# Patient Record
Sex: Male | Born: 1960 | Race: Black or African American | Hispanic: No | Marital: Single | Smoking: Former smoker
Health system: Southern US, Community
[De-identification: ages and names within clinical notes are randomized; demographics above are authoritative.]

## PROBLEM LIST (undated history)

## (undated) DIAGNOSIS — J45909 Unspecified asthma, uncomplicated: Secondary | ICD-10-CM

## (undated) DIAGNOSIS — J449 Chronic obstructive pulmonary disease, unspecified: Secondary | ICD-10-CM

## (undated) DIAGNOSIS — R0600 Dyspnea, unspecified: Secondary | ICD-10-CM

## (undated) DIAGNOSIS — F419 Anxiety disorder, unspecified: Secondary | ICD-10-CM

## (undated) DIAGNOSIS — F409 Phobic anxiety disorder, unspecified: Secondary | ICD-10-CM

## (undated) DIAGNOSIS — C801 Malignant (primary) neoplasm, unspecified: Secondary | ICD-10-CM

## (undated) DIAGNOSIS — M1990 Unspecified osteoarthritis, unspecified site: Secondary | ICD-10-CM

## (undated) DIAGNOSIS — E119 Type 2 diabetes mellitus without complications: Secondary | ICD-10-CM

## (undated) HISTORY — PX: ABDOMINAL SURGERY: SHX537

## (undated) HISTORY — PX: HERNIA REPAIR: SHX51

## (undated) HISTORY — PX: KNEE SURGERY: SHX244

---
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
## 2000-09-10 ENCOUNTER — Emergency Department (HOSPITAL_COMMUNITY): Admission: EM | Admit: 2000-09-10 | Discharge: 2000-09-10 | Payer: Self-pay | Admitting: Emergency Medicine

## 2000-09-10 ENCOUNTER — Encounter: Payer: Self-pay | Admitting: Emergency Medicine

## 2013-09-27 ENCOUNTER — Emergency Department (HOSPITAL_COMMUNITY)
Admission: EM | Admit: 2013-09-27 | Discharge: 2013-09-28 | Disposition: A | Discharge status: Home / Self Care | Payer: PRIVATE HEALTH INSURANCE | Attending: Emergency Medicine | Admitting: Emergency Medicine

## 2013-09-27 ENCOUNTER — Encounter (HOSPITAL_COMMUNITY): Payer: Self-pay | Admitting: Emergency Medicine

## 2013-09-27 ENCOUNTER — Emergency Department (HOSPITAL_COMMUNITY): Payer: PRIVATE HEALTH INSURANCE

## 2013-09-27 DIAGNOSIS — R079 Chest pain, unspecified: Secondary | ICD-10-CM | POA: Insufficient documentation

## 2013-09-27 DIAGNOSIS — M79609 Pain in unspecified limb: Secondary | ICD-10-CM | POA: Insufficient documentation

## 2013-09-27 DIAGNOSIS — R071 Chest pain on breathing: Secondary | ICD-10-CM | POA: Insufficient documentation

## 2013-09-27 DIAGNOSIS — F17200 Nicotine dependence, unspecified, uncomplicated: Secondary | ICD-10-CM | POA: Diagnosis not present

## 2013-09-27 DIAGNOSIS — R0789 Other chest pain: Secondary | ICD-10-CM

## 2013-09-27 LAB — BASIC METABOLIC PANEL
ANION GAP: 14 (ref 5–15)
BUN: 18 mg/dL (ref 6–23)
CO2: 23 mEq/L (ref 19–32)
Calcium: 9.1 mg/dL (ref 8.4–10.5)
Chloride: 103 mEq/L (ref 96–112)
Creatinine, Ser: 1.07 mg/dL (ref 0.50–1.35)
GFR calc Af Amer: >90 mL/min (ref >90)
GFR, EST NON AFRICAN AMERICAN: 79 mL/min — AB (ref >90)
GLUCOSE: 98 mg/dL (ref 70–99)
POTASSIUM: 4.1 meq/L (ref 3.7–5.3)
SODIUM: 140 meq/L (ref 137–147)

## 2013-09-27 LAB — I-STAT TROPONIN, ED: TROPONIN I, POC: 0 ng/mL (ref 0.00–0.08)

## 2013-09-27 LAB — CBC
HCT: 43.3 % (ref 39.0–52.0)
Hemoglobin: 16 g/dL (ref 13.0–17.0)
MCH: 31.3 pg (ref 26.0–34.0)
MCHC: 37 g/dL — ABNORMAL HIGH (ref 30.0–36.0)
MCV: 84.6 fL (ref 78.0–100.0)
Platelets: 248 10*3/uL (ref 150–400)
RBC: 5.12 MIL/uL (ref 4.22–5.81)
RDW: 13.3 % (ref 11.5–15.5)
WBC: 7.3 10*3/uL (ref 4.0–10.5)

## 2013-09-27 LAB — PRO B NATRIURETIC PEPTIDE

## 2013-09-27 IMAGING — CR DG CHEST 2V
2 series · 2 of 2 positions shown · non-contrast
Comparison: None.

CLINICAL DATA: 53-year-old male with left chest pain.

EXAM:
CHEST  2 VIEW

[w chest pa]
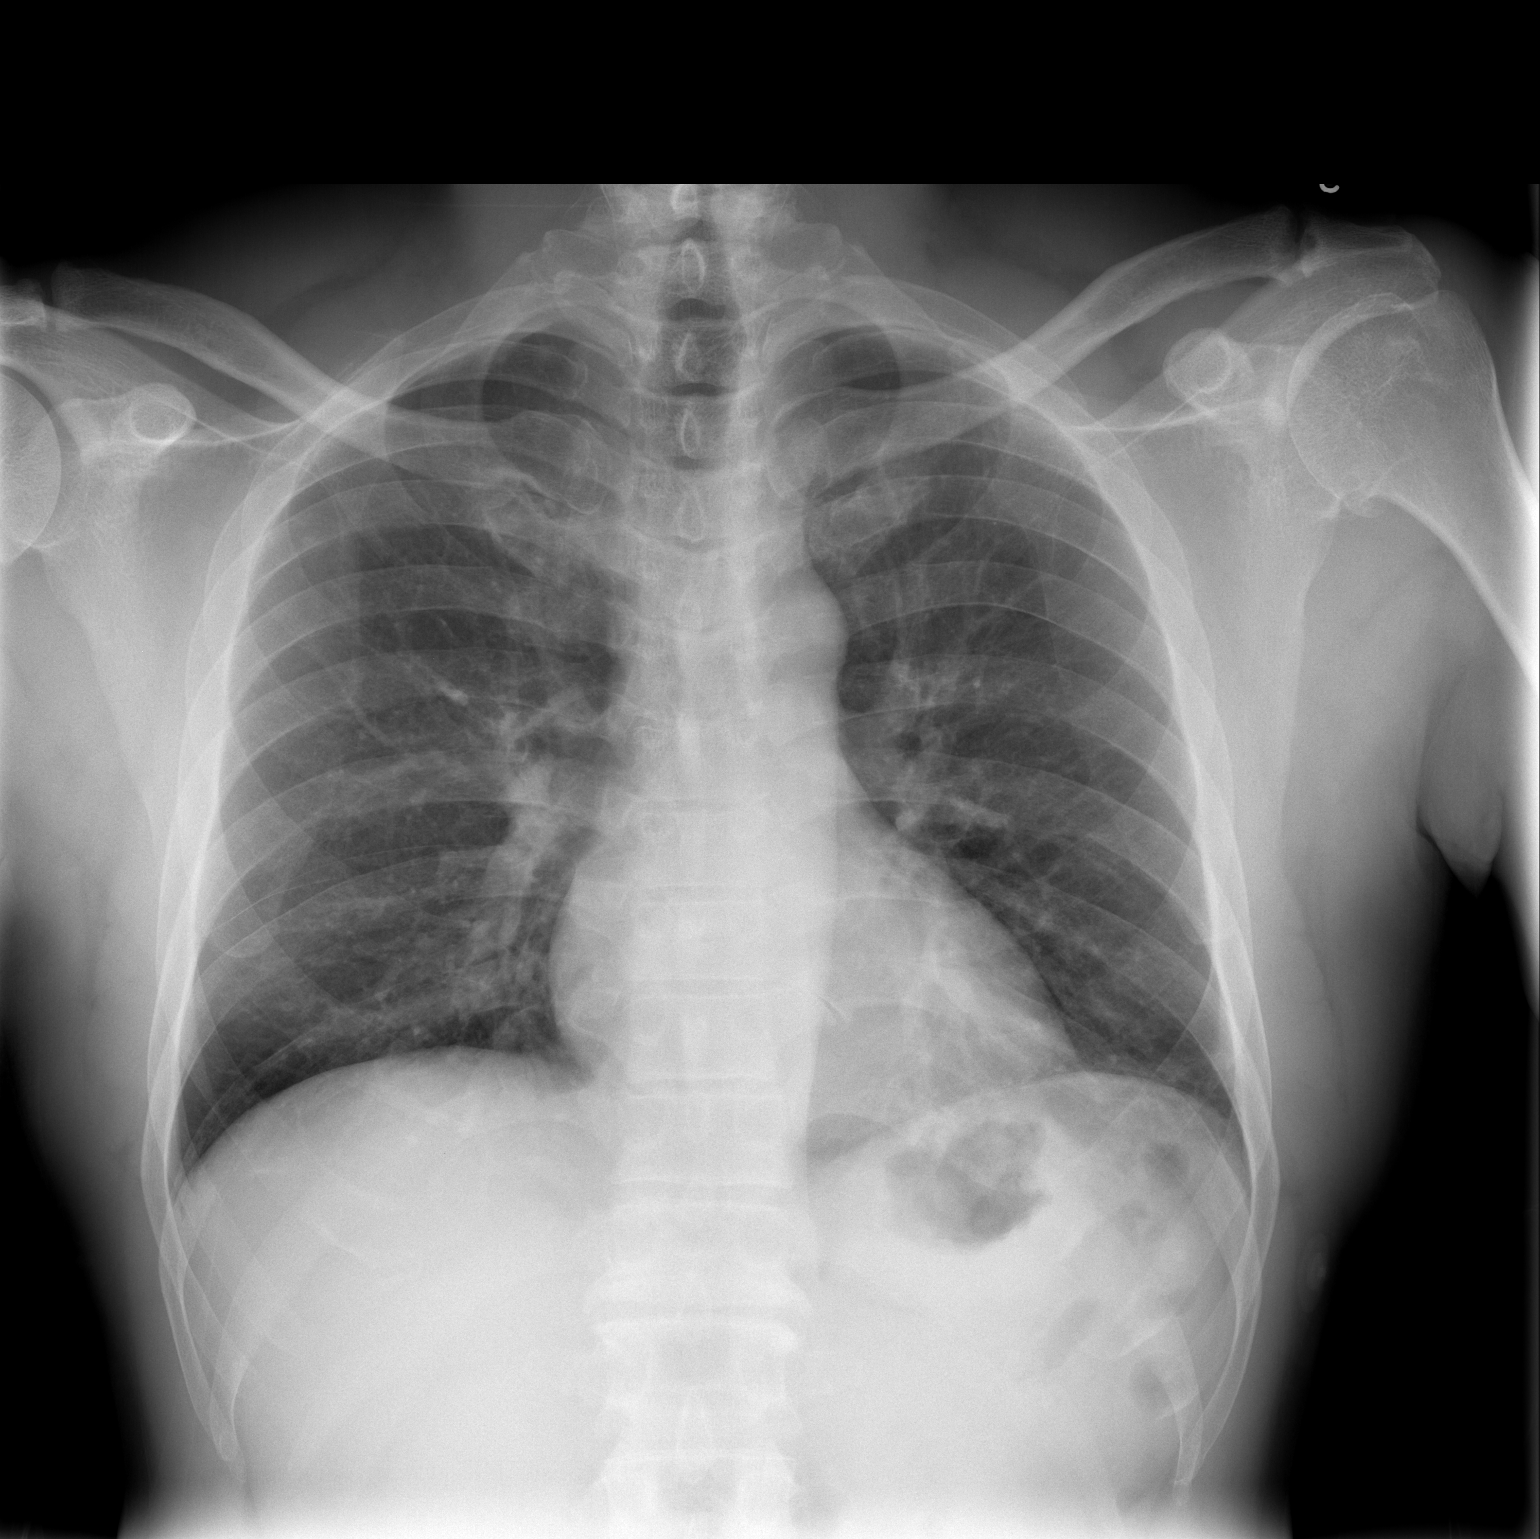

[w chest lat]
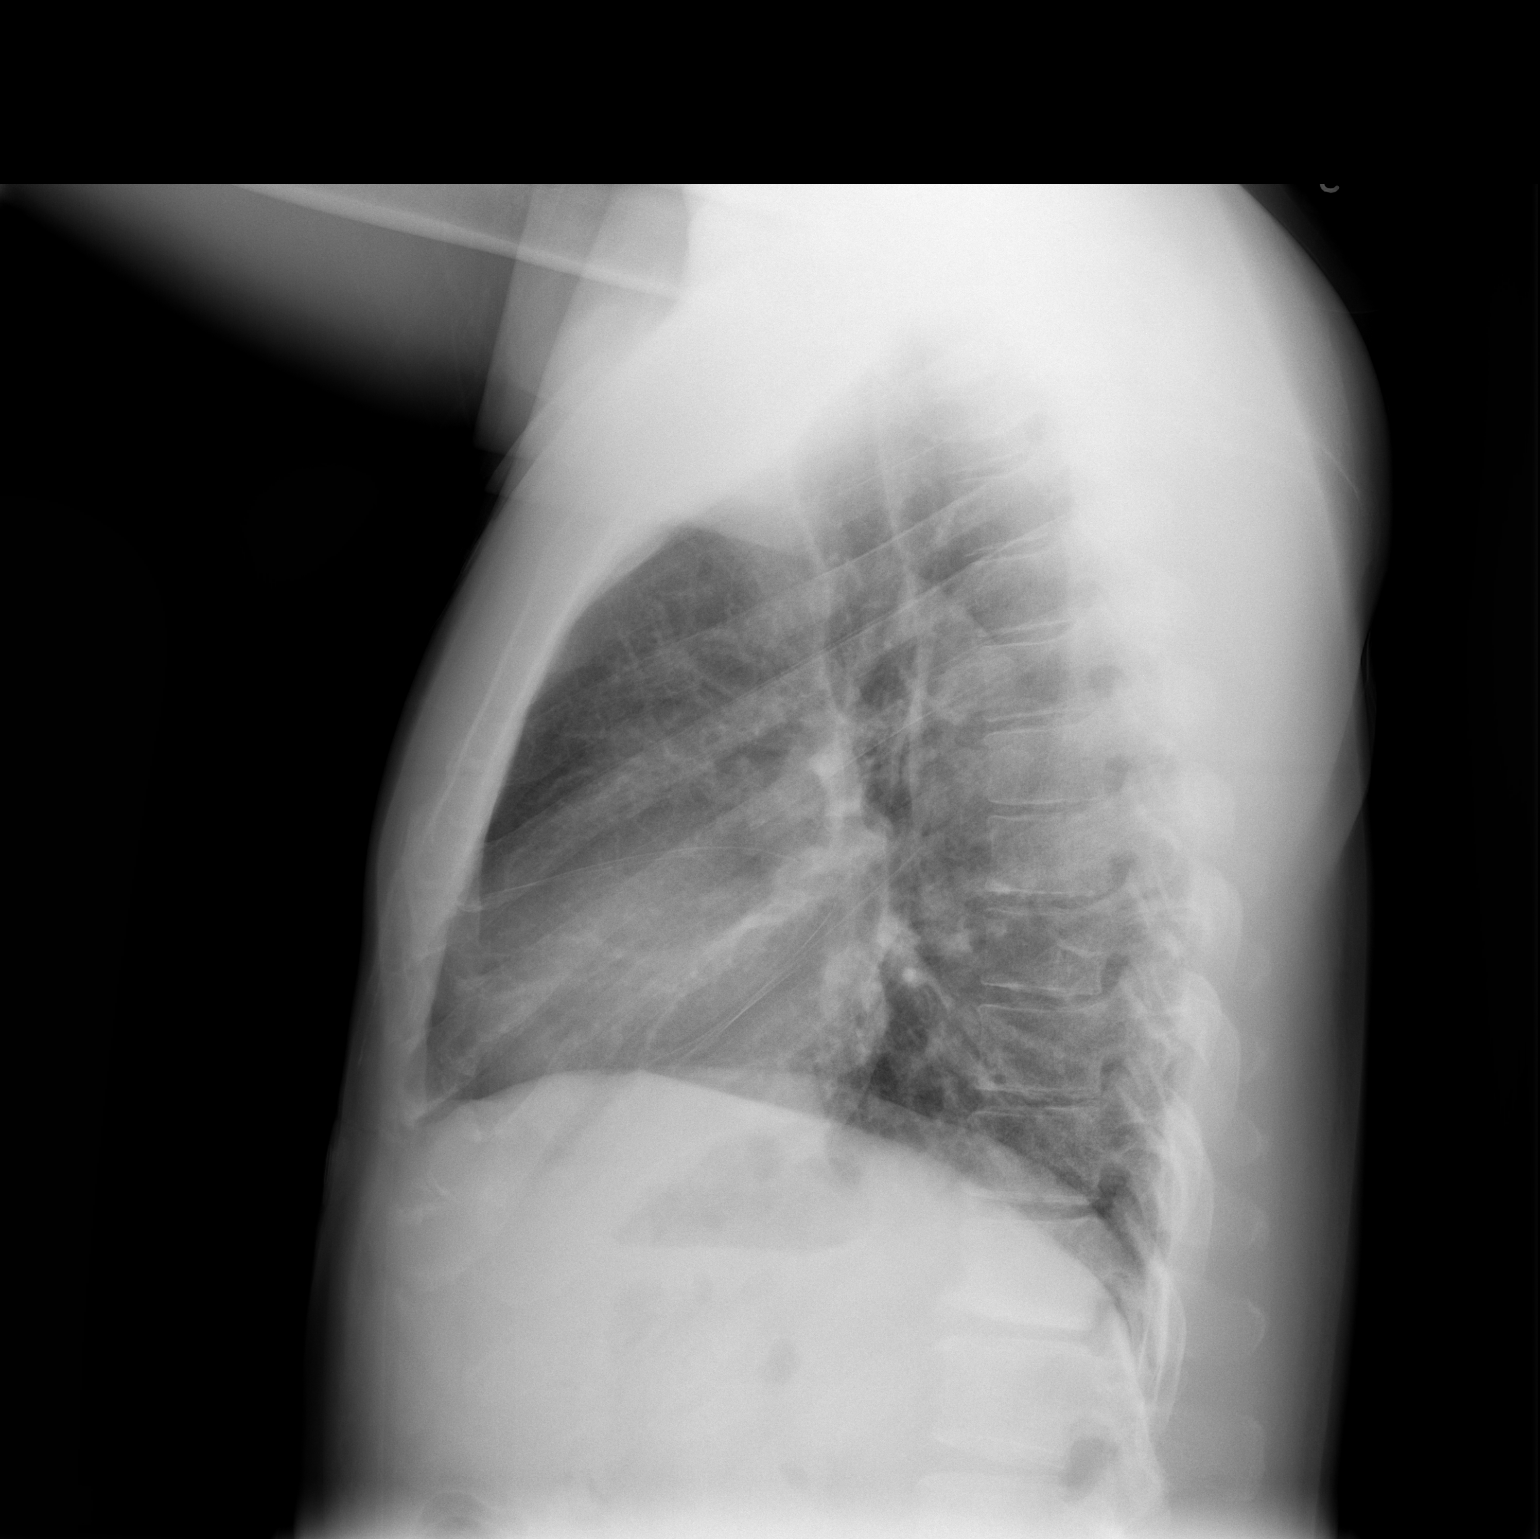

[2 of 2 positions shown; findings below may reference images not displayed]

FINDINGS: The cardiomediastinal silhouette is unremarkable.

Mild peribronchial thickening is noted -likely chronic.

There is no evidence of focal airspace disease, pulmonary edema,
suspicious pulmonary nodule/mass, pleural effusion, or pneumothorax.
No acute bony abnormalities are identified.
IMPRESSION: Mild peribronchial thickening of uncertain chronicity without other
significant abnormality.

## 2013-09-27 MED ORDER — TRAMADOL HCL 50 MG PO TABS
50.0000 mg | ORAL_TABLET | Freq: Once | ORAL | Status: AC
  Administered 2013-09-28: 50 mg via ORAL
  Filled 2013-09-27: qty 1

## 2013-09-27 NOTE — ED Provider Notes (Signed)
CSN: [id number]     Arrival date & time 09/27/13  1955 History   First MD Initiated Contact with Patient 09/27/13 2324     Chief Complaint  Patient presents with  . Chest Pain     (Consider location/radiation/quality/duration/timing/severity/associated sxs/prior Treatment) HPI  53 year old male without a significant past medical history he was a smoker presents complaining of chest pain. Patient report waking up this morning with sharp achy pain to his left side of chest, radiates down to his left arm and also radiates up to his neck. Pain initially intense but has improved throughout the day. Pain is worsening with neck movement especially to the left, with raising his arm, and also experiencing tingling sensation throughout his hands and fingers. He also report and diaphoretic this morning along with having some shortness of breath. That has improved. He is a smoker but denies any significant history of heart disease or family history of premature cardiac death. He's never had any prior provocative cardiac testing. No prior history of PE DVT, no recent surgery, prolonged bed rest, unilateral leg swelling or calf pain, hemoptysis, or history of active cancer. 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 He usually has to lift 70-80 pounds of weight at work.   History reviewed. No pertinent past medical history. History reviewed. No pertinent past surgical history. No family history on file. History  Substance Use Topics  . Smoking status: Current Every Day Smoker  . Smokeless tobacco: Not on file  . Alcohol Use: No    Review of Systems  All other systems reviewed and are negative.     Allergies  Review of patient's allergies indicates no known allergies.  Home Medications   Prior to Admission medications   Medication Sig Start Date End Date Taking? Authorizing Provider  ibuprofen (ADVIL,MOTRIN) 100 MG chewable tablet Chew 100 mg by mouth  every 8 (eight) hours as needed.   Yes Historical Provider, MD   BP 125/80  Pulse 73  Temp(Src) 98 F (36.7 C) (Oral)  Resp 18  Ht  (1.753 m)  Wt 176 lb (79.833 kg)  BMI 25.98 kg/m2  SpO2 98% Physical Exam  Nursing note and vitals reviewed. Constitutional: He appears well-developed and well-nourished. No distress.  HENT:  Head: Atraumatic.  Eyes: Conjunctivae are normal.  Neck: Normal range of motion. Neck supple. No JVD present.  Cardiovascular: Normal rate and regular rhythm.  Exam reveals no gallop and no friction rub.   No murmur heard. Pulmonary/Chest: Effort normal and breath sounds normal. No respiratory distress. He has no wheezes. He has no rales. He exhibits tenderness (tenderness along left anterior chest wall without crepitus or emphysema, no overlying skin changes).  Abdominal: Soft. There is no tenderness.  Musculoskeletal: He exhibits tenderness (Tenderness throughout left arm, and left paracervical region without focal point tenderness or deformity.). He exhibits no edema.  Neurological: He is alert.  Intact left radial pulse with decreased grip strength but brisk cap refills to all fingers.  Skin: No rash noted.  Congenital deformity of right hand.  Psychiatric: He has a normal mood and affect.    ED Course  Procedures (including critical care time)  11:55 PM Patient here with reproducible chest wall pain and left arm and neck pain. I suspect this is musculoskeletal in origin. TIMI 0, Cannot use PERC criteria due to age however low suspicion for PE.   12:30 AM  Chest x-ray with mild peribronchial thickening  of uncertain chronicity without other significant abnormality. Normal troponin, normal pro BNP, the remainder of his labs are reassuring. His symptoms likely muscle skeletal in origin, and were treated with NSAIDs. Return precautions discussed. Resources provide. Patient report having intermittent bouts of anxiety and panic attack. No panic attack  currently. We'll provide Vistaril to use as needed for anxiety and panic attack.  Labs Review Labs Reviewed  CBC - Abnormal; Notable for the following:    MCHC 37.0 (*)    All other components within normal limits  BASIC METABOLIC PANEL - Abnormal; Notable for the following:    GFR calc non Af Amer 79 (*)    All other components within normal limits  PRO B NATRIURETIC PEPTIDE  I-STAT TROPOININ, ED    Imaging Review Dg Chest 2 View  09/27/2013   CLINICAL DATA:  53 year old male with left chest pain.  EXAM: CHEST  2 VIEW  COMPARISON:  None.  FINDINGS: The cardiomediastinal silhouette is unremarkable.  Mild peribronchial thickening is noted -likely chronic.  There is no evidence of focal airspace disease, pulmonary edema, suspicious pulmonary nodule/mass, pleural effusion, or pneumothorax. No acute bony abnormalities are identified.  IMPRESSION: Mild peribronchial thickening of uncertain chronicity without other significant abnormality.   Electronically Signed   By: Laveda Abbe M.D.   On: 09/27/2013 21:19     EKG Interpretation None      Date: 09/27/2013  Rate: 67  Rhythm: normal sinus rhythm  QRS Axis: normal  Intervals: normal  ST/T Wave abnormalities: normal  Conduction Disutrbances: none  Narrative Interpretation:   Old EKG Reviewed: none available for comparison     MDM   Final diagnoses:  Chest wall pain    BP 131/86  Pulse 58  Temp(Src) 98 F (36.7 C) (Oral)  Resp 13  Ht  (1.753 m)  Wt 176 lb (79.833 kg)  BMI 25.98 kg/m2  SpO2 98%  I have reviewed nursing notes and vital signs. I personally reviewed the imaging tests through PACS system  I reviewed available ER/hospitalization records thought the EMR     Fayrene Helper, [place] 09/28/13 0034

## 2013-09-27 NOTE — ED Notes (Signed)
Reports intermittent sharp chest pain since this morning that woke pt up.  Also reports sob and numbness to L fingers.  Grips strong and equal.  Denies nausea and vomiting.  No arm drift.  Took Advil without relief.

## 2013-09-27 NOTE — ED Notes (Signed)
Pt from home. States that he woke up this am with chest pain and right arm numbness. States that he has no cardiac history. Does smoke cigarettes. Pt admits to anxiety and panic attacks but does not know why he is anxious. States he has had panic attacks for past 6 yrs. Has not seen a counselor for the anxiety.

## 2013-09-28 MED ORDER — METHOCARBAMOL 500 MG PO TABS: 500.0000 mg | ORAL_TABLET | Freq: Two times a day (BID) | ORAL | Status: DC

## 2013-09-28 MED ORDER — HYDROXYZINE HCL 25 MG PO TABS: 25.0000 mg | ORAL_TABLET | Freq: Three times a day (TID) | ORAL | Status: DC | PRN

## 2013-09-28 MED ORDER — IBUPROFEN 800 MG PO TABS: 800.0000 mg | ORAL_TABLET | Freq: Three times a day (TID) | ORAL | Status: DC

## 2013-09-28 NOTE — Discharge Instructions (Signed)
Chest Wall Pain Chest wall pain is pain in or around the bones and muscles of your chest. It may take up to 6 weeks to get better. It may take longer if you must stay physically active in your work and activities.  CAUSES  Chest wall pain may happen on its own. However, it may be caused by:  A viral illness like the flu.  Injury.  Coughing.  Exercise.  Arthritis.  Fibromyalgia.  Shingles. HOME CARE INSTRUCTIONS   Avoid overtiring physical activity. Try not to strain or perform activities that cause pain. This includes any activities using your chest or your abdominal and side muscles, especially if heavy weights are used.  Put ice on the sore area.  Put ice in a plastic bag.  Place a towel between your skin and the bag.  Leave the ice on for 15-20 minutes per hour while awake for the first 2 days.  Only take over-the-counter or prescription medicines for pain, discomfort, or fever as directed by your caregiver. SEEK IMMEDIATE MEDICAL CARE IF:   Your pain increases, or you are very uncomfortable.  You have a fever.  Your chest pain becomes worse.  You have new, unexplained symptoms.  You have nausea or vomiting.  You feel sweaty or lightheaded.  You have a cough with phlegm (sputum), or you cough up blood. MAKE SURE YOU:   Understand these instructions.  Will watch your condition.  Will get help right away if you are not doing well or get worse. Document Released: 01/03/2005 Document Revised: 03/28/2011 Document Reviewed: 08/30/2010 [facility] Patient Information 2015 [place], [place]. This information is not intended to replace advice given to you by your health care provider. Make sure you discuss any questions you have with your health care provider.  Chest Pain (Nonspecific) It is often hard to give a diagnosis for the cause of chest pain. There is always a chance that your pain could be related to something serious, such as a heart attack or a blood clot in  the lungs. You need to follow up with your doctor. HOME CARE  If antibiotic medicine was given, take it as directed by your doctor. Finish the medicine even if you start to feel better.  For the next few days, avoid activities that bring on chest pain. Continue physical activities as told by your doctor.  Do not use any tobacco products. This includes cigarettes, chewing tobacco, and e-cigarettes.  Avoid drinking alcohol.  Only take medicine as told by your doctor.  Follow your doctor's suggestions for more testing if your chest pain does not go away.  Keep all doctor visits you made. GET HELP IF:  Your chest pain does not go away, even after treatment.  You have a rash with blisters on your chest.  You have a fever. GET HELP RIGHT AWAY IF:   You have more pain or pain that spreads to your arm, neck, jaw, back, or belly (abdomen).  You have shortness of breath.  You cough more than usual or cough up blood.  You have very bad back or belly pain.  You feel sick to your stomach (nauseous) or throw up (vomit).  You have very bad weakness.  You pass out (faint).  You have chills. This is an emergency. Do not wait to see if the problems will go away. Call your local emergency services (911 in U.S[place] Do not drive yourself to the hospital. MAKE SURE YOU:   Understand these instructions.  Will watch your  condition.  Will get help right away if you are not doing well or get worse. Document Released: 06/22/2007 Document Revised: 01/08/2013 Document Reviewed: 06/22/2007 [facility] Patient Information 2015 [place], [place]. This information is not intended to replace advice given to you by your health care provider. Make sure you discuss any questions you have with your health care provider.   Emergency Department Resource Guide 1) Find a Doctor and Pay Out of Pocket Although you won't have to find out who is covered by your insurance plan, it is a good idea to ask around and  get recommendations. You will then need to call the office and see if the doctor you have chosen will accept you as a new patient and what types of options they offer for patients who are self-pay. Some doctors offer discounts or will set up payment plans for their patients who do not have insurance, but you will need to ask so you aren't surprised when you get to your appointment.  2) Contact Your Local Health Department Not all health departments have doctors that can see patients for sick visits, but many do, so it is worth a call to see if yours does. If you don't know where your local health department is, you can check in your phone book. The CDC also has a tool to help you locate your state's health department, and many state websites also have listings of all of their local health departments.  3) Find a Walk-in Clinic If your illness is not likely to be very severe or complicated, you may want to try a walk in clinic. These are popping up all over the country in pharmacies, drugstores, and shopping centers. They're usually staffed by nurse practitioners or physician assistants that have been trained to treat common illnesses and complaints. They're usually fairly quick and inexpensive. However, if you have serious medical issues or chronic medical problems, these are probably not your best option.  No Primary Care Doctor: - Call Health Connect at  [phone] - they can help you locate a primary care doctor that  accepts your insurance, provides certain services, etc. - Physician Referral Service- [phone]  Chronic Pain Problems: Organization         Address  Phone   Notes  [facility]  [phone] Patients need to be referred by their primary care doctor.   Medication Assistance: Organization         Address  Phone   Notes  [facility] Medication [facility] [address] [place] [place], [place] [address] [phone] --Must be a resident of  [facility] -- Must have NO insurance coverage whatsoever (no Medicaid/ Medicare, etc.) -- The pt. MUST have a primary care doctor that directs their care regularly and follows them in the community   [facility]ist  [phone]   [employer]  [phone]    Agencies that provide inexpensive medical care: Organization         Address  Phone   Notes  [facility]  [phone]   [facility]    [phone]   [facility] [address] [place], [place] [address] [phone]   [facility] [address] [place] [place] [phone]   Planned Parenthood    [phone]   [facility]    [phone]   [facility]  [address] [place], [employer] Phone:  [phone], Fax:  [phone] Hours of Operation:  9 am - 6 pm, M-F.  Also accepts Medicaid/Medicare and self-pay.  [facility]  [address], [place] Phone: [phone], Fax: [phone]. Hours of Operation:  8:30 am - 5:30 pm, M-F.  Also accepts Medicaid and self-pay.  [facility] [place] [address] [place] Phone: [phone]   [facility] [address] Natasha Bence [place], [place] [phone], Ext. [phone] Mondays & Thursdays: 7-9 AM.  First 15 patients are seen on a first come, first serve basis.    Medicaid-accepting [facility] Providers:  Organization         Address  Phone   Notes  [facility] [address], [place] [phone] Also accepts self-pay patients.  [facility] [address] Laurell Josephs [place], [place]  [phone]   [facility] [address], [place] [phone]   [facility] [address], [place] [phone]   Renaye Rakers [address], [place]   [phone] Only accepts [place] Access  [place] patients after they have their name applied to their card.   Self-Pay (no insurance) in [facility]:  Organization         Address  Phone   Notes  Sickle Cell Patients, [facility] [address] [place], [place] [phone]   [facility] [address] [place], [place] [phone]   [facility] [place]  [address], [place] [phone]   [facility]/Dr. Osei-Bonsu  [address], [place] or [address], [place] [phone] Phone number for both [place] and [place] locations is the same.  [facility] [address], [place] [phone]   [facility] [address] [place] or [address] [phone]   [facility] [address] [place] [phone], phone; [phone], fax Sees patients 1st and 3rd Saturday of every month.  Must not qualify for public or private insurance (i.e. Medicaid, Medicare, [place] Health Choice, Veterans' Benefits)  Household income should be no more than 200% of the poverty level The clinic cannot treat you if you are pregnant or think you are pregnant  Sexually transmitted diseases are not treated at the clinic.    Dental Care: Organization         Address  Phone  Notes  [facility] Department of [facility] [address] [place], [place] [phone] Accepts children up to age 43 who are enrolled in [place] or [place] Health Choice; pregnant women with a Medicaid card; and children who have applied for Medicaid or [place] Health Choice, but were declined, whose parents can pay a reduced fee at time of service.  [facility] Department of [facility]  [address], [place] [phone] Accepts children up to age 66 who are enrolled in [place] or [place] Health Choice; pregnant women with a Medicaid  card; and children who have applied for Medicaid or  Health Choice, but were declined, whose parents can pay a reduced fee at time of service.  [facility]  [address] [place], [place] [phone] Patients are seen by appointment only. Walk-ins  are not accepted. [facility] will see patients 40 years of age and older. Monday - Tuesday (8am-5pm) Most Wednesdays (8:30-5pm) $30 per visit, cash only  [facility] Adult [facility] Access [facility]  [address], [facility] [phone] Patients are seen by appointment only. Walk-ins are not accepted. [facility] will see patients 31 years of age and older. One Wednesday Evening (Monthly: Volunteer Based).  $30 per visit, cash only  [employer]  [phone] for adults; Children under age 64, call Graduate Pediatric Dentistry at [phone]. Children aged 61-14, please call [phone] to request a pediatric application.  Dental services are provided in all areas of dental care including fillings, crowns and bridges, complete and partial dentures, implants, gum treatment, root canals, and extractions. Preventive care is also provided. Treatment is provided to both adults and children. Patients are selected via a lottery and there is often a waiting list.   [facility] [address], [place]  [phone] [email or web address]   [facility] [address] [place], [place] [phone], Ext. [phone] Second and Fourth Thursday of each month, opens at 6:30 AM; Clinic ends at 9 AM.  Patients are seen on a first-come first-served basis, and a limited number are seen during each clinic.   [facility]  [address] Ether Griffins [place], [place] [phone]   Eligibility Requirements You must have lived in [place], [place], or [place] counties for at least the last three months.   You cannot be eligible for state or federal sponsored [place],  including [employer], [place], or [employer].   You generally cannot be eligible for healthcare insurance through your employer.    How to apply: Eligibility screenings are held every Tuesday and Wednesday afternoon from 1:00 pm until 4:00 pm. You do not need an appointment for the interview!  [facility] [address] [place], [place] [phone]   [facility] Health Department  [phone]   [facility] Health Department  [phone]   [facility] Health Department  [phone]    Behavioral Health Resources in the Community: Intensive Outpatient Programs Organization         Address  Phone  Notes  [facility] [address], [place], [place] [phone]   [facility] Out[facility] [address] [place], [place] [phone]   [facility] [address] [place], [place]  [phone]   [facility] Health [address],  [place], [place] [phone] or [phone]   Substance Abuse Resources Organization         Address  Phone  Notes  [facility]  [phone]   [facility]  [phone]   [facility]  [phone]   Floydene Flock  [phone]   Residential & Outpatient Substance Abuse Program  [phone]   Psychological Services Organization         Address  Phone  Notes  [facility]  [phone]   [facility] Services  [phone]   [facility] Mental Health [address] [place] [phone] or [phone]    Mobile Crisis Teams Organization         Address  Phone  Notes  [facility], Mobile Crisis Care Unit  [phone]   [facility]  [address] [place], [place] [phone]   [facility] [address] [place] [phone]    Self-Help/Support  Groups Organization         Address  Phone             Notes  [facility]  [facility] [place] - variety of support groups  [phone]- I7437963 Call for more information  Narcotics Anonymous (NA), [facility] [address], [employer] [place]  2 meetings at this location   Statistician         Address  Phone  Notes  [facility] [address],    [place]  [phone]   [facility]  [address] [place] [address], [place], [place] [phone]   [facility] [address] [place], [place] [phone] Admissions: 8am-3pm M-F  [facility] [address],    [place], [place] [phone]   [facility] [address] [place], [place], [place] [phone]   [facility] [address],  [place], [place] [phone]   [facility] - Intensive Outpatient [address] Laurell Josephs [address], [place], [place] [phone]   [facility] ([facility] [address] [place]  [place], [place] [phone] or [phone]   [facility] ([facility]) [address], [place], [place] [phone] Accepts Medicaid  [facility] [address]  [place] [phone] Substance Abuse/Addiction Treatment   [facility] Organization         Address  Phone  Notes  [facility]  [phone]   Angie Fava, PhD [address] Ervin Knack [place], [place]   [phone] or [phone]   [facility] Behavioral   [address] [place], [place] [phone]   [facility] [address], [place], [place] [phone] Insurance/Medicaid/sponsorship through [facility] and Families [address]                                    [place], [place] [phone] Therapy/tele-psych/case  [facility] [address], [place] [phone]    Dr. Lolly Mustache  [phone]   [facility]  [employer] [facility] Dept. 1) [address] [place] 2)  [address], [place] 3)  [address], [place] [phone]  [phone]   [facility] Child Abuse Hotline (725)142-2715 or [phone] (After Hours)

## 2013-09-28 NOTE — ED Provider Notes (Signed)
Medical screening examination/treatment/procedure(s) were performed by non-physician practitioner and as supervising physician I was immediately available for consultation/collaboration.   EKG Interpretation None        Tomasita Crumble, MD 09/28/13 1709

## 2013-11-06 ENCOUNTER — Encounter (HOSPITAL_COMMUNITY): Payer: Self-pay | Admitting: Emergency Medicine

## 2013-11-06 ENCOUNTER — Emergency Department (HOSPITAL_COMMUNITY)
Admission: EM | Admit: 2013-11-06 | Discharge: 2013-11-06 | Disposition: A | Discharge status: Home / Self Care | Payer: PRIVATE HEALTH INSURANCE | Attending: Emergency Medicine | Admitting: Emergency Medicine

## 2013-11-06 DIAGNOSIS — L03113 Cellulitis of right upper limb: Secondary | ICD-10-CM | POA: Insufficient documentation

## 2013-11-06 DIAGNOSIS — Q681 Congenital deformity of finger(s) and hand: Secondary | ICD-10-CM | POA: Insufficient documentation

## 2013-11-06 DIAGNOSIS — Z720 Tobacco use: Secondary | ICD-10-CM | POA: Diagnosis not present

## 2013-11-06 DIAGNOSIS — M24541 Contracture, right hand: Secondary | ICD-10-CM | POA: Insufficient documentation

## 2013-11-06 DIAGNOSIS — L84 Corns and callosities: Secondary | ICD-10-CM | POA: Diagnosis not present

## 2013-11-06 DIAGNOSIS — L989 Disorder of the skin and subcutaneous tissue, unspecified: Secondary | ICD-10-CM | POA: Diagnosis present

## 2013-11-06 MED ORDER — TRAMADOL HCL 50 MG PO TABS: 50.0000 mg | ORAL_TABLET | Freq: Four times a day (QID) | ORAL | Status: DC | PRN

## 2013-11-06 MED ORDER — CEPHALEXIN 500 MG PO CAPS
1000.0000 mg | ORAL_CAPSULE | Freq: Two times a day (BID) | ORAL | Status: DC
Start: 2013-11-06 — End: 2013-11-08

## 2013-11-06 MED ORDER — SULFAMETHOXAZOLE-TRIMETHOPRIM 800-160 MG PO TABS: 1.0000 | ORAL_TABLET | Freq: Two times a day (BID) | ORAL | Status: DC

## 2013-11-06 NOTE — ED Provider Notes (Signed)
CSN: [id number]     Arrival date & time 11/06/13  0806 History   First MD Initiated Contact with Patient 11/06/13 [phone]     Chief Complaint  Patient presents with  . Wound Infection     (Consider location/radiation/quality/duration/timing/severity/associated sxs/prior Treatment) HPI  The patient was one week his right hand has had an area of increased swelling and pain. He identifies an area that was cut at work. 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 His wife reports that he constantly picks at specific area on the palmar surface just proximal to the fourth and fifth digits. The digits are significantly contracted permanently with a thick callous formed at the palmar crease. It is hypertrophic thickened area I have confirmed with patient and wife is a chronic condition. He reports his become painful for him to use a stick shift for his car because of the pressure on the palm is painful. He also notes that he works with many chemicals at work. He however does not endorse has been any injection type injury of chemicals into his hand. There is no specific work related injury however chest constant exposure.  History reviewed. No pertinent past medical history. Past Surgical History  Procedure Laterality Date  . Abdominal surgery    . Knee surgery     No family history on file. History  Substance Use Topics  . Smoking status: Current Every Day Smoker -- 1.00 packs/day    Types: Cigarettes  . Smokeless tobacco: Not on file  . Alcohol Use: Yes    Review of Systems Constitutional: No fevers or chills   Allergies  Naproxen  Home Medications   Prior to Admission medications   Medication Sig Start Date End Date Taking? Authorizing Provider  hydrOXYzine (ATARAX/VISTARIL) 25 MG tablet Take 1 tablet (25 mg total) by mouth every 8 (eight) hours as needed for anxiety. 09/28/13  Yes Fayrene HelperBowie Tran, PA-C  Phenyleph-CPM-DM-APAP (ALKA-SELTZER PLUS COLD & COUGH)  05-18-08-325 MG CAPS Take 2 capsules by mouth once.   Yes Historical Provider, MD  Phenylephrine-Pheniramine-DM [facility](THERAFLU COLD & COUGH PO) Take 30 mLs by mouth once.   Yes Historical Provider, MD  cephALEXin (KEFLEX) 500 MG capsule Take 2 capsules (1,000 mg total) by mouth 2 (two) times daily. 11/06/13   Arby BarretteMarcy Esly Selvage, MD  sulfamethoxazole-trimethoprim (SEPTRA DS) 800-160 MG per tablet Take 1 tablet by mouth 2 (two) times daily. 11/06/13   Arby BarretteMarcy Travor Royce, MD  traMADol (ULTRAM) 50 MG tablet Take 1 tablet (50 mg total) by mouth every 6 (six) hours as needed. 11/06/13   Arby BarretteMarcy Taym Twist, MD   BP 141/97  Pulse 73  Temp(Src) 98.1 F (36.7 C) (Oral)  Resp 18  SpO2 100% Physical Exam  Constitutional: He is oriented to person, place, and time. He appears well-developed and well-nourished. No distress.  HENT:  Head: Normocephalic and atraumatic.  Eyes: EOM are normal.  Cardiovascular: Intact distal pulses.   Pulmonary/Chest: Effort normal.  Abdominal: He exhibits no distension. There is no tenderness.  Musculoskeletal: Normal range of motion. He exhibits no edema.       Right hand: He exhibits tenderness.       Hands: Neurological: He is alert and oriented to person, place, and time. He has normal strength. Coordination normal. GCS eye subscore is 4. GCS verbal subscore is 5. GCS motor subscore is 6.  Skin: Skin is warm, dry and intact.  Psychiatric: He has a normal mood and affect.  Continuation of hand. Tender  to palpation on palmar surface around and under callous area. No palpable fluctuance. Possible swelling but difficult to differentiate swelling from chronic deformity. No swelling of thumb, wrist or proximal palm which have near normal anatomy. ED Course  Procedures (including critical care time) Labs Review Labs Reviewed - No data to display  Imaging Review No results found.   EKG Interpretation None      MDM   Final diagnoses:  Cellulitis of hand, right  Congenital deformity  of hand   At this time the patient will be treated for cellulitis. He does have pain and possible palm are swelling. There is very chronic callous formation there does not appear to have any acute injury related to it although it could be a nidus for infection. I can't palpate any fluctuance or suggestion of abscess at this point time. His fingers have chronic contracture is also difficult to assess for any possible tenosynovitis however range of motion at the wrist and the palm do not appear to be impacted. At this point and the patient be treated with Bactrim and Keflex for cellulitis with recommendation for followup with the hand specialist. 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 The patient is counseled to elevate the hand and not apply any constrictive dressing for padding except when needed for specific activities.    Arby BarretteMarcy Mariela Rex, MD 11/06/13 [phone]

## 2013-11-06 NOTE — ED Notes (Signed)
Pt c/o R hand pain and wound infection x 1 week.  Pain score 8/10.  Pt reports that he noticed a cut on his hand last week and area has become increasingly painful, swollen, red, and warm.  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

## 2013-11-06 NOTE — Discharge Instructions (Signed)
Cellulitis °Cellulitis is an infection of the skin and the tissue beneath it. The infected area is usually red and tender. Cellulitis occurs most often in the arms and lower legs.  °CAUSES  °Cellulitis is caused by bacteria that enter the skin through cracks or cuts in the skin. The most common types of bacteria that cause cellulitis are staphylococci and streptococci. °SIGNS AND SYMPTOMS  °· Redness and warmth. °· Swelling. °· Tenderness or pain. °· Fever. °DIAGNOSIS  °Your health care provider can usually determine what is wrong based on a physical exam. Blood tests may also be done. °TREATMENT  °Treatment usually involves taking an antibiotic medicine. °HOME CARE INSTRUCTIONS  °· Take your antibiotic medicine as directed by your health care provider. Finish the antibiotic even if you start to feel better. °· Keep the infected arm or leg elevated to reduce swelling. °· Apply a warm cloth to the affected area up to 4 times per day to relieve pain. °· Take medicines only as directed by your health care provider. °· Keep all follow-up visits as directed by your health care provider. °SEEK MEDICAL CARE IF:  °· You notice red streaks coming from the infected area. °· Your red area gets larger or turns dark in color. °· Your bone or joint underneath the infected area becomes painful after the skin has healed. °· Your infection returns in the same area or another area. °· You notice a swollen bump in the infected area. °· You develop new symptoms. °· You have a fever. °SEEK IMMEDIATE MEDICAL CARE IF:  °· You feel very sleepy. °· You develop vomiting or diarrhea. °· You have a general ill feeling (malaise) with muscle aches and pains. °MAKE SURE YOU:  °· Understand these instructions. °· Will watch your condition. °· Will get help right away if you are not doing well or get worse. °Document Released: 10/13/2004 Document Revised: 05/20/2013 Document Reviewed: 03/21/2011 °ExitCare® Patient Information ©2015 [employer].  This information is not intended to replace advice given to you by your health care provider. Make sure you discuss any questions you have with your health care provider. ° ° °Emergency Department Resource Guide °1) Find a Doctor and Pay Out of Pocket °Although you won't have to find out who is covered by your insurance plan, it is a good idea to ask around and get recommendations. You will then need to call the office and see if the doctor you have chosen will accept you as a new patient and what types of options they offer for patients who are self-pay. Some doctors offer discounts or will set up payment plans for their patients who do not have insurance, but you will need to ask so you aren't surprised when you get to your appointment. ° °2) Contact Your Local Health Department °Not all health departments have doctors that can see patients for sick visits, but many do, so it is worth a call to see if yours does. If you don't know where your local health department is, you can check in your phone book. The CDC also has a tool to help you locate your state's health department, and many state websites also have listings of all of their local health departments. ° °3) Find a Walk-in Clinic °If your illness is not likely to be very severe or complicated, you may want to try a walk in clinic. These are popping up all over the country in pharmacies, drugstores, and shopping centers. They're usually staffed by nurse practitioners   or physician assistants that have been trained to treat common illnesses and complaints. They're usually fairly quick and inexpensive. However, if you have serious medical issues or chronic medical problems, these are probably not your best option. ° °No Primary Care Doctor: °- Call [facility] at  832-8000 - they can help you locate a primary care doctor that  accepts your insurance, provides certain services, etc. °- Physician Referral Service- 1-800-533-3463 ° °Chronic Pain  Problems: °Organization         Address  Phone   Notes  °[facility]  (336) 297-2271 Patients need to be referred by their primary care doctor.  ° °Medication Assistance: °Organization         Address  Phone   Notes  °[place] [facility] [address] °[place], [place] [address] (336) 641-8030 --Must be a resident of [place] °-- Must have NO insurance coverage whatsoever (no Medicaid/ Medicare, etc.) °-- The pt. MUST have a primary care doctor that directs their care regularly and follows them in the community °  °[facility]  (866) 331-1348   °[employer]  (888) 892-1162   ° °Agencies that provide inexpensive medical care: °Organization         Address  Phone   Notes  °[facility]  (336) 832-8035   °[facility]    (336) 832-7272   °[facility] [address] °[place], [place] [address] (336) 832-4777   °[facility] [address], °[place] (336) 271-4999   °[facility]    (336) 373-0678   °[facility]    (336) 272-1050   °[facility] ° [address], [place] Phone:  (336) 832-4444, Fax:  (336) 832-4440 Hours of Operation:  9 am - 6 pm, M-F.  Also accepts Medicaid/Medicare and self-pay.  °[facility] ° [address], [place] Phone: (336) 832-3150, Fax: (336) 832-3151. Hours of Operation:  8:30 am - 5:30 pm, M-F.  Also accepts Medicaid and self-pay.  °[facility] [place] [address], [place] Phone: (336) 878-6027   °[facility] [address], [place], [place] (336)723-1848, Ext. 123 Mondays & Thursdays: 7-9 AM.  First 15 patients are seen on a first come, first serve basis. °  ° °Medicaid-accepting [place] Providers: ° °Organization         Address  Phone   Notes  °[facility] [address], [place] (336) 641-2100 Also  accepts self-pay patients.  °[facility] [address], [place] ° (336) 856-9996   °[facility] [address], [place] (336) 288-8857   °[facility] [address] [place] [address], [place] (336) 299-7000   °Veita Bland [address], [place]  ° (336) 373-1557 Only accepts [place] Access Medicaid patients after they have their name applied to their card.  ° °Self-Pay (no insurance) in [place]: ° °Organization         Address  Phone   Notes  °Sickle Cell Patients, [facility] [address], [place] (336) 832-1970   °[facility] [address],  (336) 832-4400   °[facility] ° [address], [place] (336) 992-4800   °[facility]/Dr. Osei-Bonsu ° [address]   [place] [address], [place] or [address], [place] (336) 841-8500 Phone number for both [place] and [place] locations is the same.  °[facility] [address], [place] (336) 299-0000   °[facility] [address] [place] [address], [place] or [address] (336) 852-7530 °(336) 878-2260   °[facility] [address], [place] (336) 350-1642, phone; (336) 294-5005, fax Sees patients 1st and 3rd Saturday of every month.  Must not qualify for public or private insurance (i.e. Medicaid, Medicare, [place] Health Choice, Veterans' Benefits) • Household income should be no more than 200% of the poverty level •The clinic cannot treat you if you are pregnant or think you are pregnant • Sexually transmitted diseases are not treated at the clinic.  ° ° °Dental Care: °Organization         Address  Phone  Notes  °[facility] [address], [place] (336) 641-6152 Accepts children up to age 21 who are enrolled in Medicaid or [place] Health Choice; pregnant  women with a Medicaid card; and children who have applied for Medicaid or [place] Health Choice, but were declined, whose parents can pay a reduced fee at time of service.  °[facility] [place]  [address], [place] (336) 641-7733 Accepts children up to age 21 who are enrolled in Medicaid or [place] Health Choice; pregnant women with a Medicaid card; and children who have applied for Medicaid or [place] Health Choice, but were declined, whose parents can pay a reduced fee at time of service.  °[facility] ° [address], [place] (336) 641-4533 Patients are seen by appointment only. Walk-ins are not accepted. [facility] will see patients 18 years of age and older. °Monday - Tuesday (8am-5pm) °Most Wednesdays (8:30-5pm) °$30 per visit, cash only  °[facility] ° [address], [place] (336) 641-4533 Patients are seen by appointment only. Walk-ins are not accepted. [facility] will see patients 18 years of age and older. °One Wednesday Evening (Monthly: Volunteer Based).  $30 per visit, cash only  °[facility]  (919) 537-3737 for adults; Children under age 4, call Graduate Pediatric Dentistry at (919) [phone]. Children aged 4-14, please call (919) [phone] to request a pediatric application. ° Dental services are provided in all areas of dental care including fillings, crowns and bridges, complete and partial dentures, implants, gum treatment, root canals, and extractions. Preventive care is also provided. Treatment is provided to both adults and children. °Patients are selected via a lottery and there is often a waiting list. °  °[facility] [address], ° ° (336) 763-8833 [email or web address] °  °[facility] [address], [place], [place] (336)723-1848, Ext. 123 Second and Fourth Thursday of each month, opens at 6:30 AM; Clinic ends at 9 AM.  Patients are  seen on a first-come first-served basis, and a limited number are seen during each clinic.  ° °[facility] ° [address], [place], [place] (336) 723-7904   Eligibility Requirements °You must have lived in [place], [place], or [place] counties for at least the last three months. °  You cannot be eligible for state or federal sponsored healthcare insurance, including Veterans Administration, Medicaid, or Medicare. °  You generally cannot be eligible for healthcare insurance through your employer.  °    How to apply: °Eligibility screenings are held every Tuesday and Wednesday afternoon from 1:00 pm until 4:00 pm. You do not need an appointment for the interview!  °[facility] [address], [place], [place] 336-631-2330   °[facility]  336-342-8273   °[facility]  336-703-3100   °[facility]  336-570-6415   ° °Behavioral Health Resources in the Community: °Intensive Outpatient Programs °Organization         Address  Phone  Notes  °[facility] [address], [place], [place] 336-878-6098   °[facility] [address], [place], [place] 336-832-9800   °[facility] [address], [place], [place] ° 336-882-2125   °[facility] [address],  °[place], [place] 1-800-853-5163 or 336-641-4981   °Substance Abuse Resources °Organization         Address  Phone  Notes  °[facility]  336-882-2125   °[facility]  336-784-9470   °[facility]  336-285-9073   °[facility]  336-845-3988   °Residential & Outpatient Substance Abuse Program  1-800-659-3381   °Psychological Services °Organization         Address  Phone  Notes  °[facility]  336- 832-9600   °[facility]  336- 378-7881   °[facility] [address], [place] 1-800-853-5163 or 336-641-4981   ° °Mobile Crisis  Teams °Organization         Address  Phone  Notes  °[facility], Mobile Crisis Care Unit  1-877-626-1772   °Assertive °[facility] ° [address] [place], [place] 336-834-9664   °Sharon DeEsch [address] °[place] 336-554-5454   ° °Self-Help/Support Groups °Organization         Address  Phone             Notes  °[facility] [place] - variety of support groups  336- 373-1402 Call for more information  °Narcotics Anonymous (NA), [facility] [address], °[place]  2 meetings at this location  ° °Residential Treatment Programs °Organization         Address  Phone  Notes  °[facility] [address],    °[place]  1-866-801-8205   °[facility] ° [address], [place], [place] 704-293-8524   °[facility] [address], [place] 336-845-3988 Admissions: 8am-3pm M-F  °[facility] [address]    °[place], [place] 336-841-1104   °[facility] [address], [place], [place] 336-379-7146   °[facility] [address]  °[place], [place] 336-285-9073   °[facility] - Intensive Outpatient [address], [place], [place] 336-852-3033   °[facility] [address]  °[place], [place] 1-877-615-2722 or 336-784-9470   °[facility] [address] , [place] 336-227-7417 Accepts Medicaid  °[facility] [address]  °[place] 1-800-659-3381 Substance Abuse/Addiction Treatment  ° °[place] Behavioral Health Resources °Organization         Address  Phone  Notes  °[facility]  (888) 581-9988   °Julie Brannon, PhD [address] [place], [place]   (336) 349-5553 or (336) 951-0000   °[facility]   [address] °[place],  (336) 349-4454   °[facility] [address],   [place], [place] (336) 342-8316  Insurance/Medicaid/sponsorship through [facility]  °[facility] [address]                                    [place], [place] (336) 342-8316 Therapy/tele-psych/case  °[facility] [address]  ° [place], [place] (336) 349-2233    °Dr. Arfeen  (336) 349-4544   °[facility] [place]  [employer] [place] [facility] 1) [address],  °2) [address], [place] °3)  [address], [place] (336) 349-3220 °(336) 342-7768 ° °(336) 342-8140   °[place] [facility] (336) 342-1394 or (336) 342-3537 (After Hours)    ° ° ° °

## 2013-11-07 ENCOUNTER — Ambulatory Visit (HOSPITAL_COMMUNITY)
Admission: EM | Admit: 2013-11-07 | Discharge: 2013-11-08 | Disposition: A | Discharge status: Home / Self Care | Payer: PRIVATE HEALTH INSURANCE | Attending: Emergency Medicine | Admitting: Emergency Medicine

## 2013-11-07 ENCOUNTER — Emergency Department (HOSPITAL_COMMUNITY): Payer: PRIVATE HEALTH INSURANCE | Admitting: Anesthesiology

## 2013-11-07 ENCOUNTER — Encounter (HOSPITAL_COMMUNITY): Payer: Self-pay | Admitting: Emergency Medicine

## 2013-11-07 ENCOUNTER — Encounter (HOSPITAL_COMMUNITY): Payer: PRIVATE HEALTH INSURANCE | Admitting: Anesthesiology

## 2013-11-07 ENCOUNTER — Encounter (HOSPITAL_COMMUNITY)
Admission: EM | Disposition: A | Discharge status: Home / Self Care | Payer: Self-pay | Source: Home / Self Care | Attending: Emergency Medicine

## 2013-11-07 DIAGNOSIS — L02511 Cutaneous abscess of right hand: Secondary | ICD-10-CM | POA: Diagnosis not present

## 2013-11-07 DIAGNOSIS — Z886 Allergy status to analgesic agent status: Secondary | ICD-10-CM | POA: Insufficient documentation

## 2013-11-07 DIAGNOSIS — F17210 Nicotine dependence, cigarettes, uncomplicated: Secondary | ICD-10-CM | POA: Insufficient documentation

## 2013-11-07 HISTORY — PX: I&D EXTREMITY: SHX5045

## 2013-11-07 HISTORY — PX: I & D EXTREMITY: SHX5045

## 2013-11-07 LAB — BASIC METABOLIC PANEL
ANION GAP: 14 (ref 5–15)
BUN: 18 mg/dL (ref 6–23)
CALCIUM: 9.3 mg/dL (ref 8.4–10.5)
CO2: 23 mEq/L (ref 19–32)
Chloride: 101 mEq/L (ref 96–112)
Creatinine, Ser: 1.05 mg/dL (ref 0.50–1.35)
GFR, EST NON AFRICAN AMERICAN: 80 mL/min — AB (ref >90)
Glucose, Bld: 102 mg/dL — ABNORMAL HIGH (ref 70–99)
POTASSIUM: 4.1 meq/L (ref 3.7–5.3)
SODIUM: 138 meq/L (ref 137–147)

## 2013-11-07 LAB — CBC WITH DIFFERENTIAL/PLATELET
BASOS ABS: 0 10*3/uL (ref 0.0–0.1)
BASOS PCT: 0 % (ref 0–1)
EOS ABS: 0.3 10*3/uL (ref 0.0–0.7)
Eosinophils Relative: 2 % (ref 0–5)
HCT: 44.9 % (ref 39.0–52.0)
Hemoglobin: 16.7 g/dL (ref 13.0–17.0)
Lymphocytes Relative: 19 % (ref 12–46)
Lymphs Abs: 2.2 10*3/uL (ref 0.7–4.0)
MCH: 30.7 pg (ref 26.0–34.0)
MCHC: 37.2 g/dL — ABNORMAL HIGH (ref 30.0–36.0)
MCV: 82.5 fL (ref 78.0–100.0)
Monocytes Absolute: 0.8 10*3/uL (ref 0.1–1.0)
Monocytes Relative: 7 % (ref 3–12)
NEUTROS PCT: 72 % (ref 43–77)
Neutro Abs: 8.4 10*3/uL — ABNORMAL HIGH (ref 1.7–7.7)
PLATELETS: 272 10*3/uL (ref 150–400)
RBC: 5.44 MIL/uL (ref 4.22–5.81)
RDW: 13.4 % (ref 11.5–15.5)
WBC: 11.7 10*3/uL — ABNORMAL HIGH (ref 4.0–10.5)

## 2013-11-07 SURGERY — IRRIGATION AND DEBRIDEMENT EXTREMITY
Anesthesia: General | Site: Hand | Laterality: Right

## 2013-11-07 MED ORDER — OXYCODONE HCL 5 MG/5ML PO SOLN: 5.0000 mg | Freq: Once | ORAL | Status: AC | PRN

## 2013-11-07 MED ORDER — MIDAZOLAM HCL 2 MG/2ML IJ SOLN
INTRAMUSCULAR | Status: AC
  Filled 2013-11-07: qty 2

## 2013-11-07 MED ORDER — MIDAZOLAM HCL 2 MG/2ML IJ SOLN
INTRAMUSCULAR | Status: DC | PRN
  Administered 2013-11-07: 2 mg via INTRAVENOUS

## 2013-11-07 MED ORDER — MIDAZOLAM HCL 2 MG/2ML IJ SOLN: 0.5000 mg | Freq: Once | INTRAMUSCULAR | Status: AC | PRN

## 2013-11-07 MED ORDER — DOUBLE ANTIBIOTIC 500-10000 UNIT/GM EX OINT
TOPICAL_OINTMENT | CUTANEOUS | Status: AC
  Filled 2013-11-07: qty 1

## 2013-11-07 MED ORDER — SODIUM CHLORIDE 0.9 % IR SOLN
Status: DC | PRN
  Administered 2013-11-07: 2000 mL

## 2013-11-07 MED ORDER — DEXAMETHASONE SODIUM PHOSPHATE 10 MG/ML IJ SOLN
INTRAMUSCULAR | Status: DC | PRN
  Administered 2013-11-07: 8 mg via INTRAVENOUS

## 2013-11-07 MED ORDER — VANCOMYCIN HCL 1000 MG IV SOLR
1000.0000 mg | INTRAVENOUS | Status: DC | PRN
  Administered 2013-11-07: 1000 mg via INTRAVENOUS

## 2013-11-07 MED ORDER — HYDROMORPHONE HCL 1 MG/ML IJ SOLN: 0.2500 mg | INTRAMUSCULAR | Status: DC | PRN

## 2013-11-07 MED ORDER — PROMETHAZINE HCL 25 MG/ML IJ SOLN: 6.2500 mg | INTRAMUSCULAR | Status: DC | PRN

## 2013-11-07 MED ORDER — SUCCINYLCHOLINE CHLORIDE 20 MG/ML IJ SOLN
INTRAMUSCULAR | Status: DC | PRN
  Administered 2013-11-07: 100 mg via INTRAVENOUS

## 2013-11-07 MED ORDER — OXYCODONE-ACETAMINOPHEN 5-325 MG PO TABS: ORAL_TABLET | ORAL | Status: DC

## 2013-11-07 MED ORDER — BUPIVACAINE HCL (PF) 0.25 % IJ SOLN
INTRAMUSCULAR | Status: DC | PRN
  Administered 2013-11-07: 10 mL

## 2013-11-07 MED ORDER — PROPOFOL 10 MG/ML IV BOLUS
INTRAVENOUS | Status: DC | PRN
  Administered 2013-11-07: 200 mg via INTRAVENOUS

## 2013-11-07 MED ORDER — FENTANYL CITRATE 0.05 MG/ML IJ SOLN
INTRAMUSCULAR | Status: AC
  Filled 2013-11-07: qty 5

## 2013-11-07 MED ORDER — OXYCODONE HCL 5 MG PO TABS: 5.0000 mg | ORAL_TABLET | Freq: Once | ORAL | Status: AC | PRN

## 2013-11-07 MED ORDER — HYDROMORPHONE HCL 1 MG/ML IJ SOLN
1.0000 mg | Freq: Once | INTRAMUSCULAR | Status: AC
  Administered 2013-11-07: 1 mg via INTRAVENOUS
  Filled 2013-11-07: qty 1

## 2013-11-07 MED ORDER — ONDANSETRON HCL 4 MG/2ML IJ SOLN
INTRAMUSCULAR | Status: DC | PRN
  Administered 2013-11-07: 4 mg via INTRAVENOUS

## 2013-11-07 MED ORDER — FENTANYL CITRATE 0.05 MG/ML IJ SOLN
INTRAMUSCULAR | Status: DC | PRN
Start: 2013-11-07 — End: 2013-11-07
  Administered 2013-11-07 (×2): 50 ug via INTRAVENOUS

## 2013-11-07 MED ORDER — VANCOMYCIN HCL IN DEXTROSE 1-5 GM/200ML-% IV SOLN
INTRAVENOUS | Status: AC
  Filled 2013-11-07: qty 200

## 2013-11-07 MED ORDER — MEPERIDINE HCL 25 MG/ML IJ SOLN: 6.2500 mg | INTRAMUSCULAR | Status: DC | PRN

## 2013-11-07 MED ORDER — PROPOFOL 10 MG/ML IV BOLUS
INTRAVENOUS | Status: AC
  Filled 2013-11-07: qty 20

## 2013-11-07 MED ORDER — BUPIVACAINE HCL (PF) 0.25 % IJ SOLN
INTRAMUSCULAR | Status: AC
  Filled 2013-11-07: qty 30

## 2013-11-07 MED ORDER — LACTATED RINGERS IV SOLN
INTRAVENOUS | Status: DC | PRN
  Administered 2013-11-07: 23:00:00 via INTRAVENOUS

## 2013-11-07 MED ORDER — ONDANSETRON HCL 4 MG/2ML IJ SOLN
4.0000 mg | Freq: Once | INTRAMUSCULAR | Status: AC
  Administered 2013-11-07: 4 mg via INTRAVENOUS
  Filled 2013-11-07: qty 2

## 2013-11-07 SURGICAL SUPPLY — 56 items
BANDAGE COBAN STERILE 2 (GAUZE/BANDAGES/DRESSINGS) ×2 IMPLANT
BANDAGE ELASTIC 3 VELCRO ST LF (GAUZE/BANDAGES/DRESSINGS) ×3 IMPLANT
BANDAGE ELASTIC 4 VELCRO ST LF (GAUZE/BANDAGES/DRESSINGS) ×3 IMPLANT
BNDG CMPR 9X4 STRL LF SNTH (GAUZE/BANDAGES/DRESSINGS) ×2
BNDG COHESIVE 1X5 TAN STRL LF (GAUZE/BANDAGES/DRESSINGS) IMPLANT
BNDG CONFORM 2 STRL LF (GAUZE/BANDAGES/DRESSINGS) ×3 IMPLANT
BNDG ESMARK 4X9 LF (GAUZE/BANDAGES/DRESSINGS) ×4 IMPLANT
BNDG GAUZE ELAST 4 BULKY (GAUZE/BANDAGES/DRESSINGS) ×3 IMPLANT
CORDS BIPOLAR (ELECTRODE) ×3 IMPLANT
COVER SURGICAL LIGHT HANDLE (MISCELLANEOUS) ×3 IMPLANT
DECANTER SPIKE VIAL GLASS SM (MISCELLANEOUS) ×3 IMPLANT
DRAIN PENROSE 1/4X12 LTX STRL (WOUND CARE) IMPLANT
DRSG ADAPTIC 3X8 NADH LF (GAUZE/BANDAGES/DRESSINGS) IMPLANT
DRSG EMULSION OIL 3X3 NADH (GAUZE/BANDAGES/DRESSINGS) ×1 IMPLANT
DRSG PAD ABDOMINAL 8X10 ST (GAUZE/BANDAGES/DRESSINGS) ×2 IMPLANT
GAUZE PACKING IODOFORM 1/4X15 (GAUZE/BANDAGES/DRESSINGS) ×2 IMPLANT
GAUZE SPONGE 4X4 12PLY STRL (GAUZE/BANDAGES/DRESSINGS) ×3 IMPLANT
GAUZE XEROFORM 1X8 LF (GAUZE/BANDAGES/DRESSINGS) ×3 IMPLANT
GLOVE BIO SURGEON STRL SZ7.5 (GLOVE) ×3 IMPLANT
GLOVE BIOGEL PI IND STRL 6.5 (GLOVE) ×1 IMPLANT
GLOVE BIOGEL PI IND STRL 8 (GLOVE) ×1 IMPLANT
GLOVE BIOGEL PI INDICATOR 6.5 (GLOVE) ×2
GLOVE BIOGEL PI INDICATOR 8 (GLOVE) ×2
GLOVE ORTHOPEDIC STR SZ6.5 (GLOVE) ×2 IMPLANT
GOWN STRL REIN XL XLG (GOWN DISPOSABLE) ×3 IMPLANT
HANDPIECE INTERPULSE COAX TIP (DISPOSABLE)
KIT BASIN OR (CUSTOM PROCEDURE TRAY) ×3 IMPLANT
KIT ROOM TURNOVER OR (KITS) ×3 IMPLANT
LOOP VESSEL MAXI BLUE (MISCELLANEOUS) IMPLANT
LOOP VESSEL MINI RED (MISCELLANEOUS) IMPLANT
MANIFOLD NEPTUNE II (INSTRUMENTS) ×3 IMPLANT
NDL HYPO 25X1 1.5 SAFETY (NEEDLE) IMPLANT
NEEDLE HYPO 25X1 1.5 SAFETY (NEEDLE) ×3 IMPLANT
NS IRRIG 1000ML POUR BTL (IV SOLUTION) ×3 IMPLANT
PACK ORTHO EXTREMITY (CUSTOM PROCEDURE TRAY) ×3 IMPLANT
PAD ARMBOARD 7.5X6 YLW CONV (MISCELLANEOUS) ×6 IMPLANT
SCRUB BETADINE 4OZ XXX (MISCELLANEOUS) ×3 IMPLANT
SET HNDPC FAN SPRY TIP SCT (DISPOSABLE) IMPLANT
SET IRRIG Y TYPE TUR BLADDER L (SET/KITS/TRAYS/PACK) ×2 IMPLANT
SOLUTION BETADINE 4OZ (MISCELLANEOUS) ×3 IMPLANT
SPONGE LAP 18X18 X RAY DECT (DISPOSABLE) ×1 IMPLANT
SPONGE LAP 4X18 X RAY DECT (DISPOSABLE) ×1 IMPLANT
SUCTION FRAZIER TIP 10 FR DISP (SUCTIONS) ×1 IMPLANT
SUT ETHILON 4 0 PS 2 18 (SUTURE) ×3 IMPLANT
SUT MON AB 5-0 P3 18 (SUTURE) IMPLANT
SWAB COLLECTION DEVICE MRSA (MISCELLANEOUS) ×3 IMPLANT
SYR CONTROL 10ML LL (SYRINGE) ×2 IMPLANT
TOWEL OR 17X24 6PK STRL BLUE (TOWEL DISPOSABLE) ×3 IMPLANT
TOWEL OR 17X26 10 PK STRL BLUE (TOWEL DISPOSABLE) ×3 IMPLANT
TUBE ANAEROBIC SPECIMEN COL (MISCELLANEOUS) ×2 IMPLANT
TUBE CONNECTING 12'X1/4 (SUCTIONS) ×1
TUBE CONNECTING 12X1/4 (SUCTIONS) ×2 IMPLANT
TUBE FEEDING 5FR 15 INCH (TUBING) IMPLANT
UNDERPAD 30X30 INCONTINENT (UNDERPADS AND DIAPERS) ×3 IMPLANT
WATER STERILE IRR 1000ML POUR (IV SOLUTION) ×3 IMPLANT
YANKAUER SUCT BULB TIP NO VENT (SUCTIONS) ×3 IMPLANT

## 2013-11-07 NOTE — Transfer of Care (Signed)
Immediate Anesthesia Transfer of Care Note  Patient: Terry Mckinney  Procedure(s) Performed: Procedure(s): IRRIGATION AND DEBRIDEMENT EXTREMITY (Right)  Patient Location: PACU  Anesthesia Type:General  Level of Consciousness: sedated  Airway & Oxygen Therapy: Patient Spontanous Breathing and Patient connected to nasal cannula oxygen  Post-op Assessment: Report given to PACU RN and Post -op Vital signs reviewed and stable  Post vital signs: Reviewed and stable  Complications: No apparent anesthesia complications

## 2013-11-07 NOTE — ED Provider Notes (Signed)
CSN: [id number]     Arrival date & time 11/07/13  1924 History   First MD Initiated Contact with Patient 11/07/13 1956     Chief Complaint  Patient presents with  . Hand Problem     (Consider location/radiation/quality/duration/timing/severity/associated sxs/prior Treatment) Patient is a 53 y.o. male presenting with hand pain. The history is provided by the patient. No language interpreter was used.  Hand Pain This is a new problem. The current episode started in the past 7 days. The problem occurs constantly. The problem has been gradually worsening. Associated symptoms include arthralgias, joint swelling, myalgias, nausea, numbness and a rash. Pertinent negatives include no chills, fever, vomiting or weakness. The symptoms are aggravated by bending. Treatments tried: bactrim, keflex. The treatment provided no relief.    History reviewed. No pertinent past medical history. Past Surgical History  Procedure Laterality Date  . Abdominal surgery    . Knee surgery     No family history on file. History  Substance Use Topics  . Smoking status: Current Every Day Smoker -- 1.00 packs/day    Types: Cigarettes  . Smokeless tobacco: Not on file  . Alcohol Use: Yes    Review of Systems  Constitutional: Negative for fever and chills.  Gastrointestinal: Positive for nausea. Negative for vomiting.  Musculoskeletal: Positive for arthralgias, joint swelling and myalgias.  Skin: Positive for rash and wound.  Allergic/Immunologic: Negative for immunocompromised state.  Neurological: Positive for numbness. Negative for weakness.  All other systems reviewed and are negative.     Allergies  Naproxen  Home Medications   Prior to Admission medications   Medication Sig Start Date End Date Taking? Authorizing Provider  acetaminophen (TYLENOL) 325 MG tablet Take 325 mg by mouth every 6 (six) hours as needed for mild pain or moderate pain.   Yes Historical Provider, MD  cephALEXin (KEFLEX)  500 MG capsule Take 2 capsules (1,000 mg total) by mouth 2 (two) times daily. 11/06/13  Yes Arby BarretteMarcy Pfeiffer, MD  hydrOXYzine (ATARAX/VISTARIL) 25 MG tablet Take 1 tablet (25 mg total) by mouth every 8 (eight) hours as needed for anxiety. 09/28/13  Yes Fayrene HelperBowie Tran, PA-C  Phenyleph-CPM-DM-APAP (ALKA-SELTZER PLUS COLD & COUGH) 05-18-08-325 MG CAPS Take 2 capsules by mouth once.   Yes Historical Provider, MD  sulfamethoxazole-trimethoprim (SEPTRA DS) 800-160 MG per tablet Take 1 tablet by mouth 2 (two) times daily. 11/06/13  Yes Arby BarretteMarcy Pfeiffer, MD  traMADol (ULTRAM) 50 MG tablet Take 1 tablet (50 mg total) by mouth every 6 (six) hours as needed. 11/06/13  Yes Arby BarretteMarcy Pfeiffer, MD   BP 144/97  Pulse 82  Temp(Src) 97.8 F (36.6 C) (Oral)  Resp 17  Ht 5\' 9"  (1.753 m)  Wt 184 lb 4.8 oz (83.598 kg)  BMI 27.20 kg/m2  SpO2 96% Physical Exam  Vitals reviewed. Constitutional: He is oriented to person, place, and time. He appears well-developed and well-nourished. No distress.  HENT:  Head: Atraumatic.  Eyes: EOM are normal.  Cardiovascular: Normal rate, regular rhythm and intact distal pulses.   Pulmonary/Chest: Effort normal and breath sounds normal.  Abdominal: Soft. There is no tenderness.  Musculoskeletal:       Right wrist: He exhibits normal range of motion.       Hands: Contractures of 4/5th digits, pain with extension, swelling of hypothenar region with erythema.   Neurological: He is alert and oriented to person, place, and time.  Skin: Skin is warm. Rash (right hand, hypothenar region) noted.    ED Course  Procedures (including critical care time) Labs Review Labs Reviewed  CBC WITH DIFFERENTIAL - Abnormal; Notable for the following:    WBC 11.7 (*)    MCHC 37.2 (*)    Neutro Abs 8.4 (*)    All other components within normal limits  BASIC METABOLIC PANEL - Abnormal; Notable for the following:    Glucose, Bld 102 (*)    GFR calc non Af Amer 80 (*)    All other components within  normal limits    Imaging Review No results found.   EKG Interpretation None      MDM   Final diagnoses:  None    53 y/o male with laceration to right palm one week ago presenting with worsening infection. Se[facility] at [facility] yesterday and treated for cellulitis. Swelling worsening. No fevers or s/s of systemic illness. Fluctuant region on palm just proximal to 4/5th MCP. Erythema of hand. Pain with extension of 4th/5th digits and fingers held in flexion at neutral position. Concern for abscess, worsening infection, and possible tenosynovitis. Will consult hand surgery.  To OR with Hand for operative management.  Pt discussed with my attending, Dr. Hyacinth MeekerMiller.   Abagail KitchensMegan Melessia Kaus, MD 11/08/13 [phone]

## 2013-11-07 NOTE — Anesthesia Preprocedure Evaluation (Addendum)
Anesthesia Evaluation  Patient identified by MRN, date of birth, ID band Patient awake    Reviewed: Allergy & Precautions, H&P , NPO status , Patient's Chart, lab work & pertinent test results  History of Anesthesia Complications Negative for: history of anesthetic complications  Airway Mallampati: I TM Distance: >3 FB Neck ROM: Full    Dental  (+) Teeth Intact, Dental Advisory Given   Pulmonary Recent URI , Residual Cough, Current Smoker,  breath sounds clear to auscultation        Cardiovascular negative cardio ROS  Rhythm:Regular Rate:Normal     Neuro/Psych negative neurological ROS     GI/Hepatic negative GI ROS, Neg liver ROS,   Endo/Other  negative endocrine ROS  Renal/GU negative Renal ROS     Musculoskeletal   Abdominal   Peds  Hematology negative hematology ROS (+)   Anesthesia Other Findings   Reproductive/Obstetrics                          Anesthesia Physical Anesthesia Plan  ASA: II and emergent  Anesthesia Plan: General   Post-op Pain Management:    Induction: Intravenous  Airway Management Planned: Oral ETT  Additional Equipment:   Intra-op Plan:   Post-operative Plan: Extubation in OR  Informed Consent: I have reviewed the patients History and Physical, chart, labs and discussed the procedure including the risks, benefits and alternatives for the proposed anesthesia with the patient or authorized representative who has indicated his/her understanding and acceptance.   Dental advisory given  Plan Discussed with: CRNA and Surgeon  Anesthesia Plan Comments: (Plan routine monitors, GETA)        Anesthesia Quick Evaluation

## 2013-11-07 NOTE — Discharge Instructions (Signed)

## 2013-11-07 NOTE — ED Provider Notes (Signed)
Pt is a 53 y/o male with hx of R hand deformity since birth - presents with worsening swelling of the R hand over 48 hours - history of laceration to the palm of the hand, started on antibiotics yesterday but has worsened instead of improving. Bedside ultrasound shows likely fluid collection underneath this area, swelling of the hand is concerning for spreading infection and possible tenosynovitis, will discussed with hand surgery.  Dr. Merlyn LotKuzma to take pt to OR  I saw and evaluated the patient, reviewed the resident's note and I agree with the findings and plan.   Final diagnoses:  None      Vida RollerBrian D Dale Ribeiro, MD 11/08/13 0930

## 2013-11-07 NOTE — Anesthesia Procedure Notes (Signed)
Procedure Name: Intubation Date/Time: 11/07/2013 10:50 PM Performed by: Molli HazardGORDON, Karelyn Brisby M Pre-anesthesia Checklist: Patient identified, Emergency Drugs available, Suction available and Patient being monitored Patient Re-evaluated:Patient Re-evaluated prior to inductionOxygen Delivery Method: Circle system utilized Preoxygenation: Pre-oxygenation with 100% oxygen Intubation Type: IV induction and Rapid sequence Laryngoscope Size: Miller and 2 Grade View: Grade II Tube type: Oral Tube size: 7.5 mm Number of attempts: 1 Airway Equipment and Method: Stylet Placement Confirmation: ETT inserted through vocal cords under direct vision,  positive ETCO2 and breath sounds checked- equal and bilateral Secured at: 23 cm Tube secured with: Tape Dental Injury: Teeth and Oropharynx as per pre-operative assessment

## 2013-11-07 NOTE — ED Notes (Signed)
Pt c/o rt hand pain/swelling after coming contact with hazardous chemicals at work. Pt was seen [facility] for same complaint 2 days ago.

## 2013-11-07 NOTE — H&P (Signed)
Terry Mckinney is an 53 y.o. male.   Chief Complaint: right hand abscess HPI: 53 yo rhd male states he has had worsening swelling and pain on ulnar side of palm of right hand over last 4 days.  No fevers, chills, night sweats.  Has had a wound in that area recently.  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  Seen at [facility] in past two days and started on oral antibiotics.  History reviewed. No pertinent past medical history.  Past Surgical History  Procedure Laterality Date  . Abdominal surgery    . Knee surgery      No family history on file. Social History:  reports that he has been smoking Cigarettes.  He has been smoking about 1.00 pack per day. He does not have any smokeless tobacco history on file. He reports that he drinks alcohol. He reports that he does not use illicit drugs.  Allergies:  Allergies  Allergen Reactions  . Naproxen Anaphylaxis, Shortness Of Breath and Swelling    Tongue and throat swelling      (Not in a hospital admission)  Results for orders placed during the hospital encounter of 11/07/13 (from the past 48 hour(s))  CBC WITH DIFFERENTIAL     Status: Abnormal   Collection Time    11/07/13  7:37 PM      Result Value Ref Range   WBC 11.7 (*) 4.0 - 10.5 K/uL   RBC 5.44  4.22 - 5.81 MIL/uL   Hemoglobin 16.7  13.0 - 17.0 g/dL   HCT 44.9  39.0 - 52.0 %   MCV 82.5  78.0 - 100.0 fL   MCH 30.7  26.0 - 34.0 pg   MCHC 37.2 (*) 30.0 - 36.0 g/dL   Comment: RULED OUT INTERFERING SUBSTANCES   RDW 13.4  11.5 - 15.5 %   Platelets 272  150 - 400 K/uL   Neutrophils Relative % 72  43 - 77 %   Neutro Abs 8.4 (*) 1.7 - 7.7 K/uL   Lymphocytes Relative 19  12 - 46 %   Lymphs Abs 2.2  0.7 - 4.0 K/uL   Monocytes Relative 7  3 - 12 %   Monocytes Absolute 0.8  0.1 - 1.0 K/uL   Eosinophils Relative 2  0 - 5 %   Eosinophils Absolute 0.3  0.0 - 0.7 K/uL   Basophils Relative 0  0 - 1 %   Basophils Absolute 0.0  0.0 - 0.1 K/uL  BASIC  METABOLIC PANEL     Status: Abnormal   Collection Time    11/07/13  7:37 PM      Result Value Ref Range   Sodium 138  137 - 147 mEq/L   Potassium 4.1  3.7 - 5.3 mEq/L   Chloride 101  96 - 112 mEq/L   CO2 23  19 - 32 mEq/L   Glucose, Bld 102 (*) 70 - 99 mg/dL   BUN 18  6 - 23 mg/dL   Creatinine, Ser 1.05  0.50 - 1.35 mg/dL   Calcium 9.3  8.4 - 10.5 mg/dL   GFR calc non Af Amer 80 (*) >90 mL/min   GFR calc Af Amer >90  >90 mL/min   Comment: (NOTE)     The eGFR has been calculated using the CKD EPI equation.     This calculation has not been validated in all clinical situations.     eGFR's persistently <90 mL/min signify possible Chronic Kidney  Disease.   Anion gap 14  5 - 15    No results found.   A comprehensive review of systems was negative except for: Respiratory: positive for shortness of breath  Blood pressure 126/91, pulse 78, temperature 97.8 F (36.6 C), temperature source Oral, resp. rate 17, height _0  (1.753 m), weight 83.598 kg (184 lb 4.8 oz), SpO2 94.00%.  General appearance: alert, cooperative and appears stated age Head: Normocephalic, without obvious abnormality, atraumatic Neck: supple, symmetrical, trachea midline Resp: clear to auscultation bilaterally Cardio: regular rate and rhythm GI: non tender Extremities: intact sensation and capillary refill all digits.  +epl/fpl/io.  left ue: no wounds or ttp.  right ue: no wounds.  ttp volar aspect ring and small mp area.  no tenderness in digits themselves.  preexisting contracture of ring and small fingers.  preexisting rotational deformity to index finger.  no proximal streaking.  no wrist pain.  thickened callused skin in area of tenderness with some fluctuance. Pulses: 2+ and symmetric Skin: Skin color, texture, turgor normal. No rashes or lesions Neurologic: Grossly normal Incision/Wound: none  Assessment/Plan Right palm abscess.  Recommend OR for incision and drainage.  Risks, benefits, and  alternatives of surgery were discussed and the patient agrees with the plan of care.   Hannan Hutmacher R 11/07/2013, 9:53 PM

## 2013-11-07 NOTE — Brief Op Note (Signed)
11/07/2013  11:35 PM  PATIENT:  Anselm Junglingheodore Costantino  53 y.o. male  PRE-OPERATIVE DIAGNOSIS:  Right Hand Abscess  POST-OPERATIVE DIAGNOSIS:  right hand abscess  PROCEDURE:  Procedure(s): IRRIGATION AND DEBRIDEMENT EXTREMITY (Right)  SURGEON:  Surgeon(s) and Role:    * Betha LoaKevin Ramondo Dietze, MD - Primary  PHYSICIAN ASSISTANT:   ASSISTANTS: none   ANESTHESIA:   general  EBL:     BLOOD ADMINISTERED:none  DRAINS: iodoform packing  LOCAL MEDICATIONS USED:  MARCAINE     SPECIMEN:  Source of Specimen:  right hand  DISPOSITION OF SPECIMEN:  micro  COUNTS:  YES  TOURNIQUET:   Total Tourniquet Time Documented: Upper Arm (Right) - 14 minutes Total: Upper Arm (Right) - 14 minutes   DICTATION: .Other Dictation: Dictation Number [id number]  PLAN OF CARE: Discharge to home after PACU  PATIENT DISPOSITION:  PACU - hemodynamically stable.

## 2013-11-07 NOTE — Op Note (Signed)
[id number] 

## 2013-11-08 ENCOUNTER — Encounter (HOSPITAL_COMMUNITY): Payer: Self-pay | Admitting: Orthopedic Surgery

## 2013-11-08 NOTE — ED Provider Notes (Signed)
I saw and evaluated the patient, reviewed the resident's note and I agree with the findings and plan.  Please see my separate note regarding my evaluation of the patient.    Vida RollerBrian D Sheridan Hew, MD 11/08/13 0930

## 2013-11-08 NOTE — Op Note (Signed)
NAMLetta Moynahan:  Rosa, Theophil               ACCOUNT NO.:  [id number]  MEDICAL RECORD NO.:  [id number]  LOCATION:  [facility]                         FACILITY:  [facility]  PHYSICIAN:  Betha LoaKevin Kemarion Abbey, MD        DATE OF BIRTH:  1960-11-26  DATE OF PROCEDURE:  11/07/2013 DATE OF DISCHARGE:                              OPERATIVE REPORT   PREOPERATIVE DIAGNOSIS:  Right hand abscess.  POSTOPERATIVE DIAGNOSIS:  Right hand abscess.  PROCEDURE:  Incision and drainage, right hand abscess.  SURGEON:  Betha LoaKevin Odaliz Mcqueary, MD  ASSISTANT:  None.  ANESTHESIA:  General.  IV FLUIDS:  Per Anesthesia flow sheet.  ESTIMATED BLOOD LOSS:  Minimal.  COMPLICATIONS:  None.  SPECIMENS:  Cultures to micro.  TOURNIQUET TIME:  Fourteen minutes.  DISPOSITION:  Stable to PACU.  INDICATIONS:  Mr. Clelia CroftShaw is a 53 year old right-hand dominant male who states he has been having issues with his right hand over the past few days.  He has had increasing pain and swelling, had fluctuant area in the palm of the hand at the ulnar side.  He presented to the emergency department where he was evaluated and I was consulted for management of injury.  He reports a congenital difference to the hand.  No fevers, chills, night sweats. On examination he has intact sensation, capillary refill in the fingertips.  He can flex his IP joint of the thumb across his fingers. Left upper extremity was without wounds, without tenderness to palpation.  Right upper extremity, he had a fluctuant area at the palmar side of the ulnar side of the right hand.  I recommended going to the operating room for incision and drainage.  Risks, benefits, and alternatives of surgery were discussed including risk of blood loss, infection, damage to nerves, vessels, tendons, ligaments, bone; failure of procedure, need for additional procedures, complications with wound healing, continued pain, continued infection, need for repeat irrigation and debridement.  He voiced  understanding of these risks and elected to proceed.  OPERATIVE COURSE:  After being identified preoperatively by myself, the patient and I agreed upon procedure and site procedure.  Surgical site was marked.  The risks, benefits, and alternatives of surgery were reviewed and wished to proceed.  Surgical consent had been signed.  He was given no IV antibiotics, to wait for cultures.  He was transferred to the operating room, placed on the operating room table in supine position with the right upper extremity on arm board.  General anesthesia was induced by Anesthesiology.  Right upper extremity was prepped and draped in normal sterile orthopedic fashion.  Surgical pause was performed between surgeons, anesthesia, operating room staff, and all were in agreement as to the patient, procedure, and site of procedure.  Tourniquet at the proximal aspect of the extremity was inflated to 250 mmHg after exsanguination of an Esmarch bandage. Incision was made over the fluctuant area on the palm of the right hand. There was significant gross purulence under pressure.  The epidermis was dried and thickened.  This was removed with the scissors.  There was a hole in the dermis.  Incision was carried through the dermis.  Spreading technique was used.  There was small  amount of gross purulence. Cultures were taken for aerobes, anaerobes.  The subcutaneous tissues were entered by spreading technique.  There was no further abscess.  The wound was copiously irrigated with 2000 mL of sterile saline by cysto- tubing.  It was then packed with quarter-inch iodoform gauze.  He was injected with 10 mL of 0.25% plain Marcaine to aid in postoperative analgesia.  He was dressed with sterile Xeroform, 4x4s, and wrapped with a Kling and Coban dressing lightly.  Tourniquet was deflated at 14 minutes.  Fingertips were pink with brisk capillary refill after deflation of tourniquet.  Operative drapes were broken down.   The patient was awoken from anesthesia safely.  He was transferred back to stretcher and taken to PACU in stable condition.  I will see him back at the beginning next week for postoperative followup.  I will give him Percocet 5/325, 1-2 p.o. q.6 hours p.r.n. pain, dispensed #40, and Bactrim DS 1 p.o. b.i.d. x7 days.     Betha LoaKevin Caleesi Kohl, MD     KK/MEDQ  D:  11/07/2013  T:  11/08/2013  Job:  [id number]

## 2013-11-08 NOTE — Anesthesia Postprocedure Evaluation (Signed)
  Anesthesia Post-op Note  Patient: Anselm Junglingheodore Reidinger  Procedure(s) Performed: Procedure(s): IRRIGATION AND DEBRIDEMENT EXTREMITY (Right)  Patient Location: PACU  Anesthesia Type:General  Level of Consciousness: awake, alert , oriented and patient cooperative  Airway and Oxygen Therapy: Patient Spontanous Breathing  Post-op Pain: none  Post-op Assessment: Post-op Vital signs reviewed, Patient's Cardiovascular Status Stable, Respiratory Function Stable, Patent Airway, No signs of Nausea or vomiting and Pain level controlled  Post-op Vital Signs: Reviewed and stable  Last Vitals:  Filed Vitals:   11/08/13 0100  BP: 137/87  Pulse: 70  Temp: 36.4 C  Resp: 15    Complications: No apparent anesthesia complications

## 2013-11-10 LAB — CULTURE, ROUTINE-ABSCESS

## 2013-11-12 LAB — ANAEROBIC CULTURE

## 2014-05-06 ENCOUNTER — Emergency Department (HOSPITAL_COMMUNITY)
Admission: EM | Admit: 2014-05-06 | Discharge: 2014-05-06 | Disposition: A | Discharge status: Home / Self Care | Payer: PRIVATE HEALTH INSURANCE | Attending: Emergency Medicine | Admitting: Emergency Medicine

## 2014-05-06 ENCOUNTER — Emergency Department (HOSPITAL_COMMUNITY): Payer: PRIVATE HEALTH INSURANCE

## 2014-05-06 ENCOUNTER — Encounter (HOSPITAL_COMMUNITY): Payer: Self-pay | Admitting: Neurology

## 2014-05-06 DIAGNOSIS — Z8659 Personal history of other mental and behavioral disorders: Secondary | ICD-10-CM | POA: Diagnosis not present

## 2014-05-06 DIAGNOSIS — R51 Headache: Secondary | ICD-10-CM

## 2014-05-06 DIAGNOSIS — W109XXA Fall (on) (from) unspecified stairs and steps, initial encounter: Secondary | ICD-10-CM | POA: Insufficient documentation

## 2014-05-06 DIAGNOSIS — Y939 Activity, unspecified: Secondary | ICD-10-CM | POA: Insufficient documentation

## 2014-05-06 DIAGNOSIS — S199XXA Unspecified injury of neck, initial encounter: Secondary | ICD-10-CM | POA: Diagnosis not present

## 2014-05-06 DIAGNOSIS — M62838 Other muscle spasm: Secondary | ICD-10-CM | POA: Diagnosis not present

## 2014-05-06 DIAGNOSIS — S24109A Unspecified injury at unspecified level of thoracic spinal cord, initial encounter: Secondary | ICD-10-CM | POA: Insufficient documentation

## 2014-05-06 DIAGNOSIS — Y929 Unspecified place or not applicable: Secondary | ICD-10-CM | POA: Insufficient documentation

## 2014-05-06 DIAGNOSIS — S4992XA Unspecified injury of left shoulder and upper arm, initial encounter: Secondary | ICD-10-CM | POA: Diagnosis not present

## 2014-05-06 DIAGNOSIS — R519 Headache, unspecified: Secondary | ICD-10-CM

## 2014-05-06 DIAGNOSIS — Y999 Unspecified external cause status: Secondary | ICD-10-CM | POA: Insufficient documentation

## 2014-05-06 DIAGNOSIS — M546 Pain in thoracic spine: Secondary | ICD-10-CM

## 2014-05-06 DIAGNOSIS — Z792 Long term (current) use of antibiotics: Secondary | ICD-10-CM | POA: Diagnosis not present

## 2014-05-06 DIAGNOSIS — W19XXXA Unspecified fall, initial encounter: Secondary | ICD-10-CM

## 2014-05-06 DIAGNOSIS — S0990XA Unspecified injury of head, initial encounter: Secondary | ICD-10-CM | POA: Insufficient documentation

## 2014-05-06 DIAGNOSIS — M542 Cervicalgia: Secondary | ICD-10-CM

## 2014-05-06 DIAGNOSIS — Z720 Tobacco use: Secondary | ICD-10-CM | POA: Insufficient documentation

## 2014-05-06 HISTORY — DX: Phobic anxiety disorder, unspecified: F40.9

## 2014-05-06 IMAGING — CR DG CERVICAL SPINE COMPLETE 4+V
6 series · 6 of 6 positions shown · non-contrast
Comparison: None

CLINICAL DATA: Fell down multiple steps today, neck and mid back
pain

EXAM:
CERVICAL SPINE  4+ VIEWS

[c-spine lat]
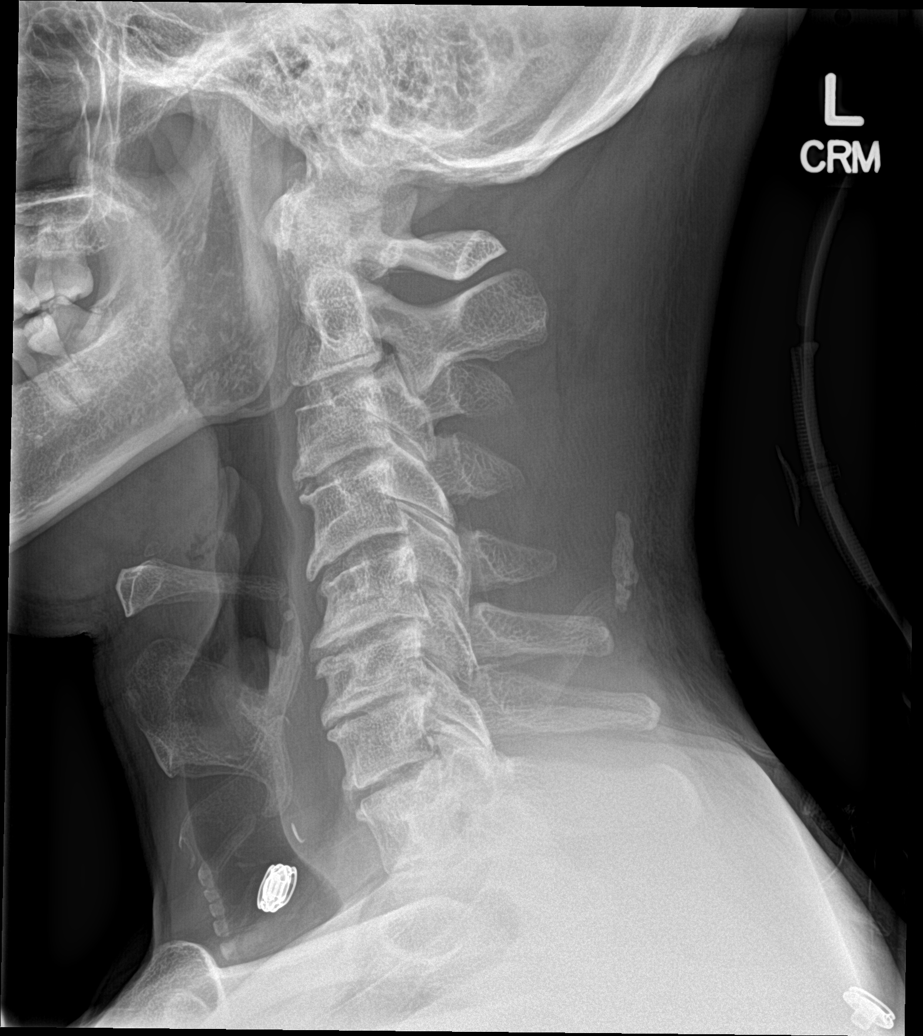

[c-spine obl (1 of 2)]
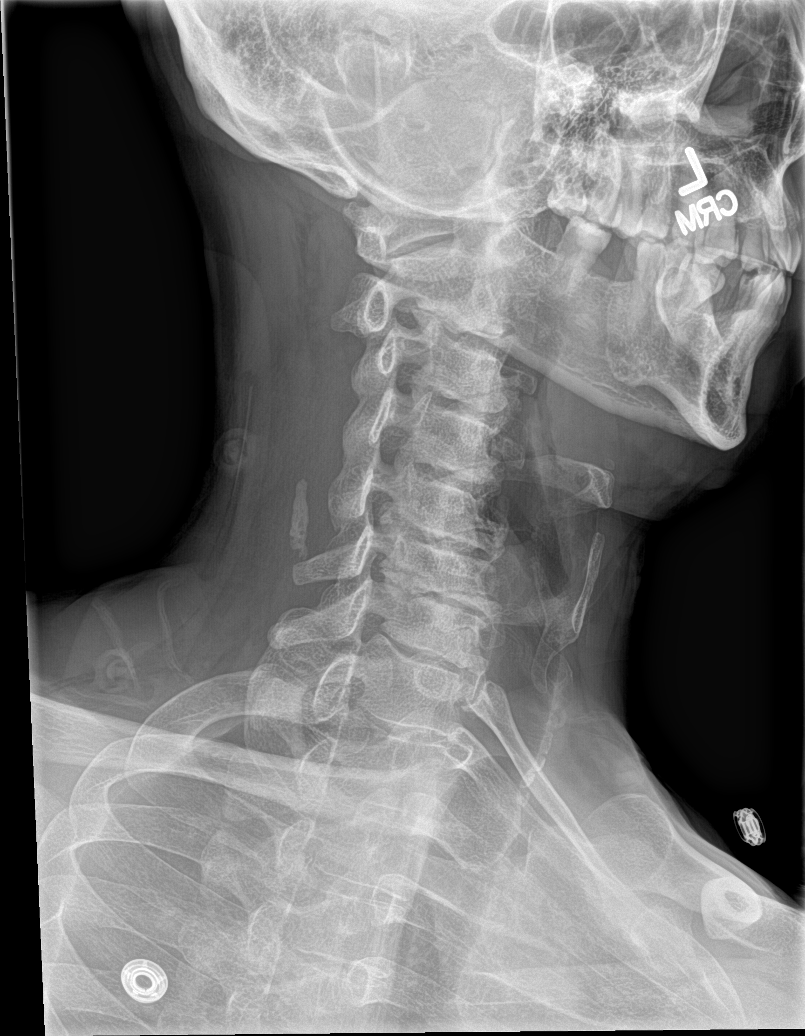

[c-spine obl (2 of 2)]
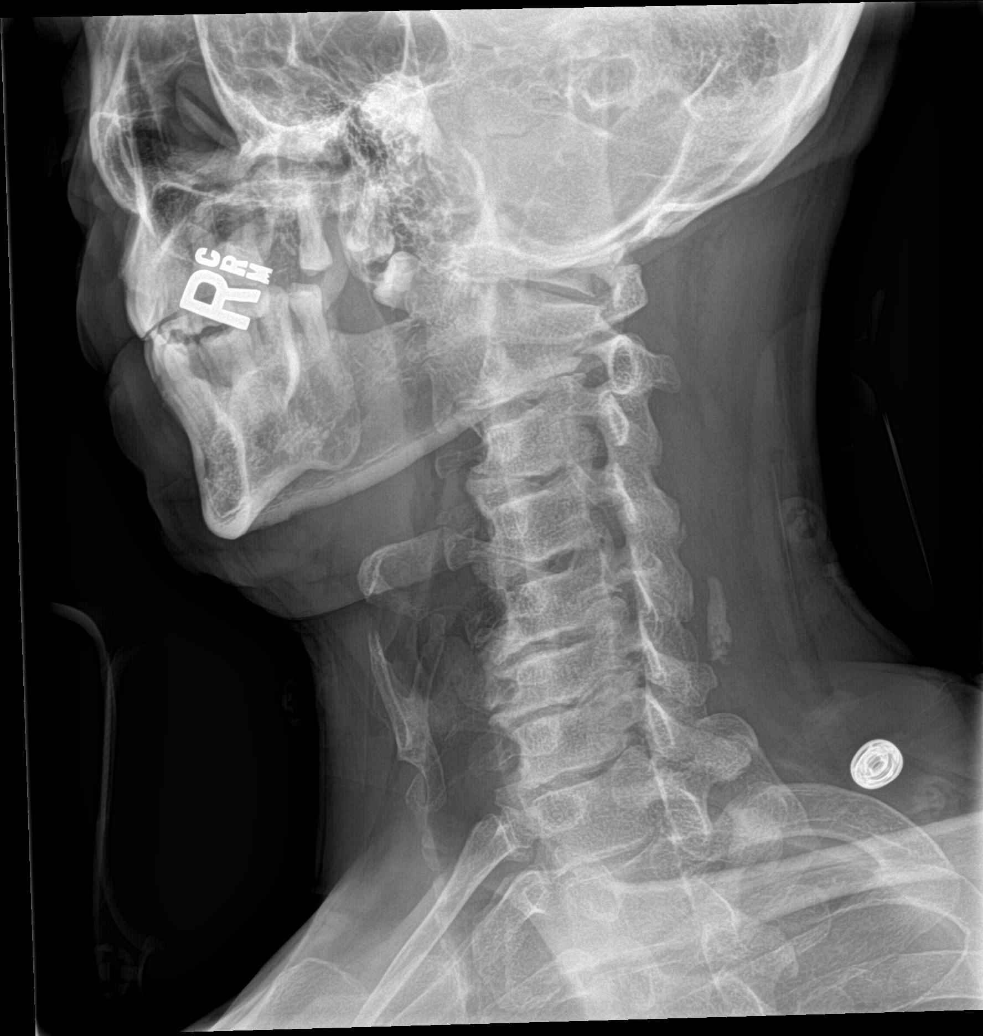

[c-spine ap]
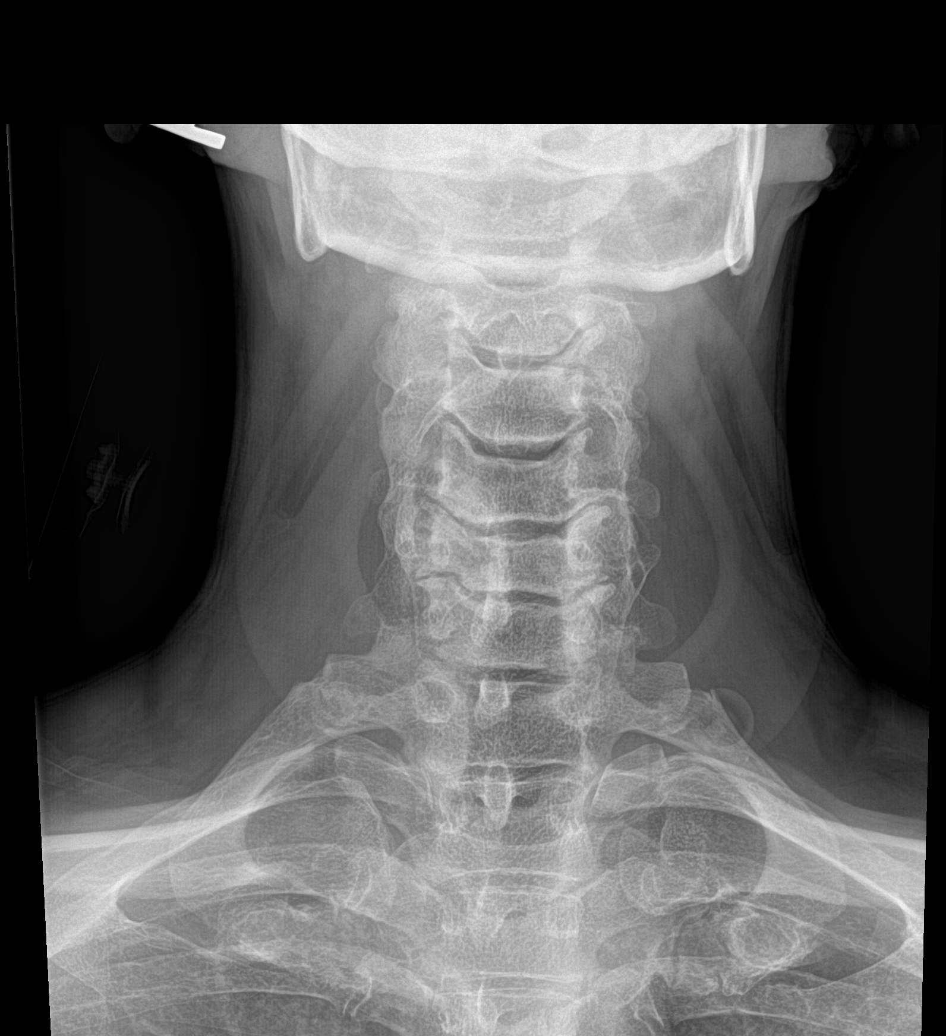

[c-spine open mouth (1 of 2)]
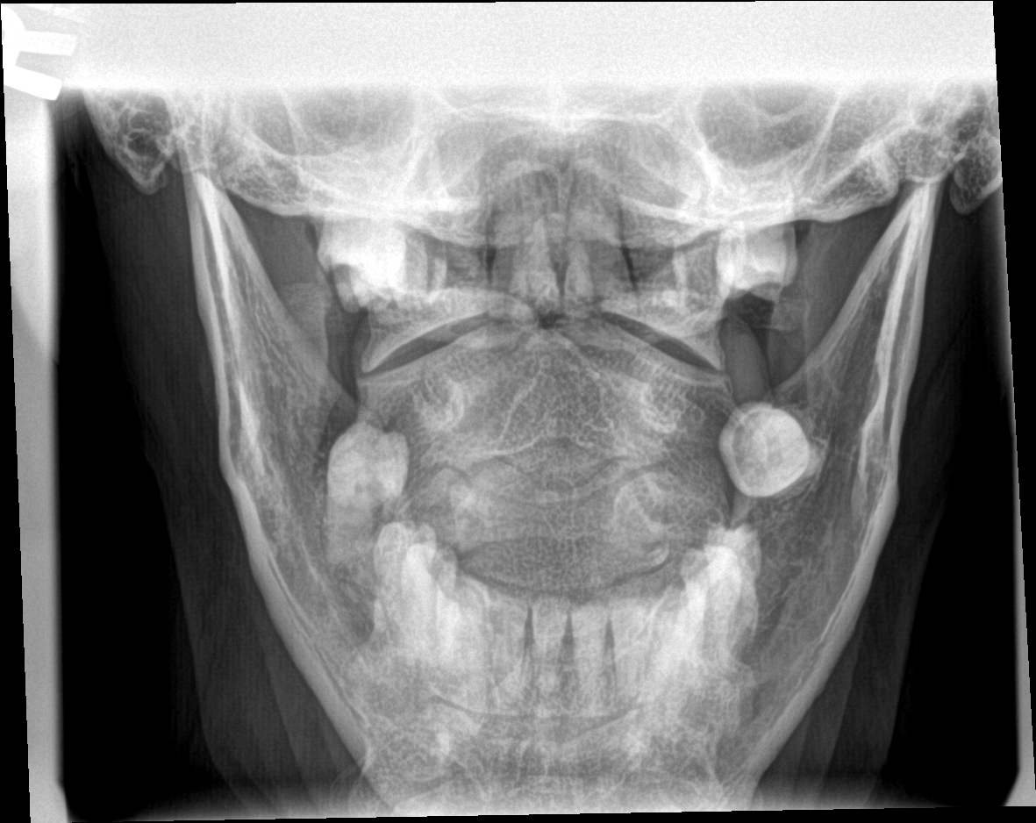

[c-spine open mouth (2 of 2)]
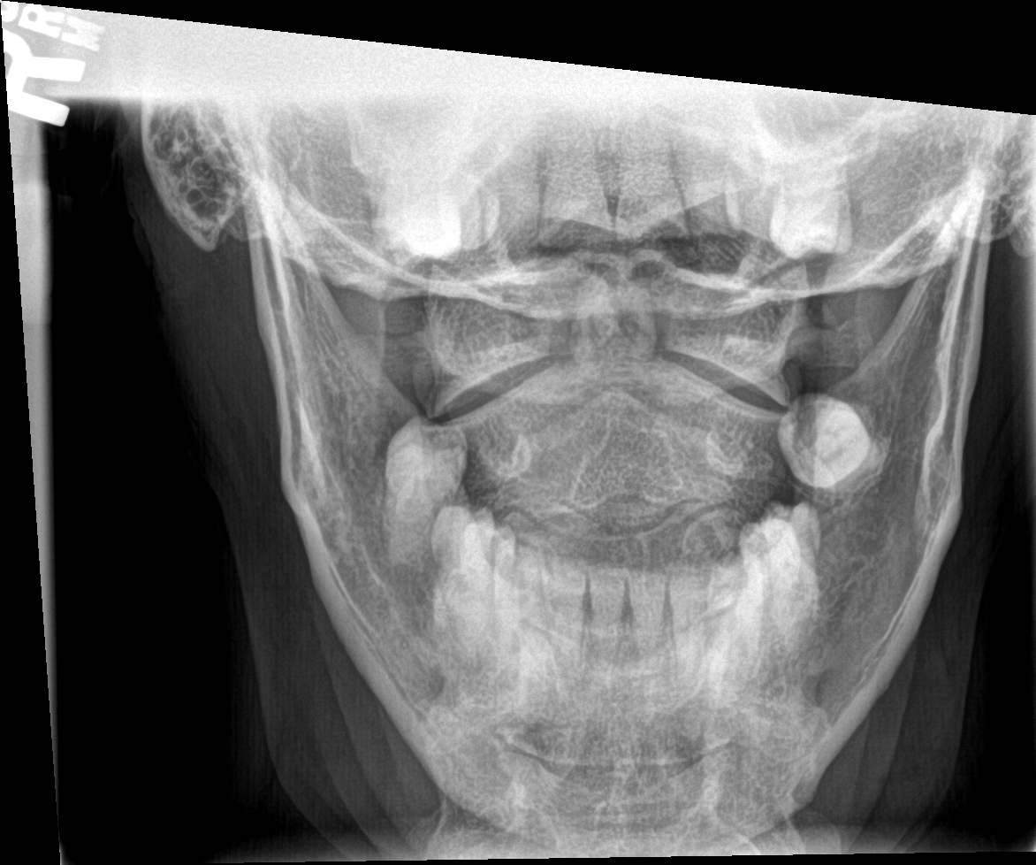

[6 of 6 positions shown; findings below may reference images not displayed]

FINDINGS: Examination performed upright in-collar.

The presence of a collar on upright images of the cervical spine may
prevent identification of ligamentous and unstable injuries.

Straightening of cervical lordosis question muscle spasm.

Prevertebral soft tissues normal thickness.

Bones appear slightly demineralized.

Multilevel disc space narrowing and endplate spur formation.

Minimal retrolisthesis C5-C6.

Scattered facet degenerative changes.

Encroachment upon cervical neural foramina bilaterally at multiple
levels most prominently at C5-C6 and C6-C7.

Vertebral body heights maintained without fracture or subluxation.

No bone destruction.

C1-C2 alignment normal.

Tips of lung apices clear.a

Curvilinear radiopacity in the prevertebral soft tissues at T1 is
not identified on remaining views, consistent with artifact.
IMPRESSION: Degenerative disc disease changes cervical spine as above.

No acute cervical spine abnormalities identified on upright
in-collar cervical spine series as discussed above.

## 2014-05-06 IMAGING — CT CT HEAD W/O CM
1 series · 16 of 30 positions shown, 20 images · non-contrast
Comparison: None.

CLINICAL DATA: Fall today, hit posterior head

EXAM:
CT HEAD WITHOUT CONTRAST
TECHNIQUE: Contiguous axial images were obtained from the base of the skull
through the vertex without intravenous contrast.

[Series 2: head 5.0 h30s · axial · 0.42mm/px · z∈[-67,+83]mm · 16 of 34 slices shown, 20 images]
[im 2/34  brain]
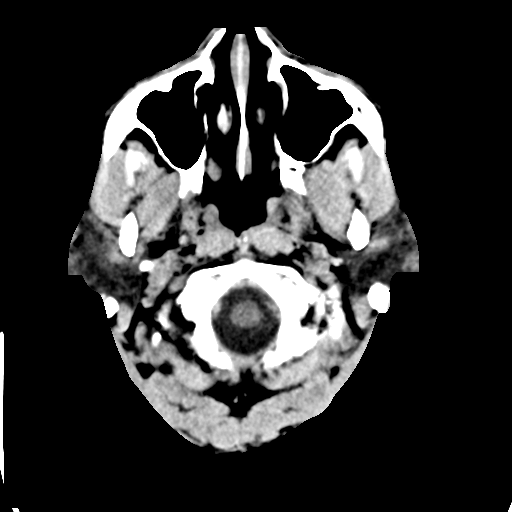
[im 2/34  bone]
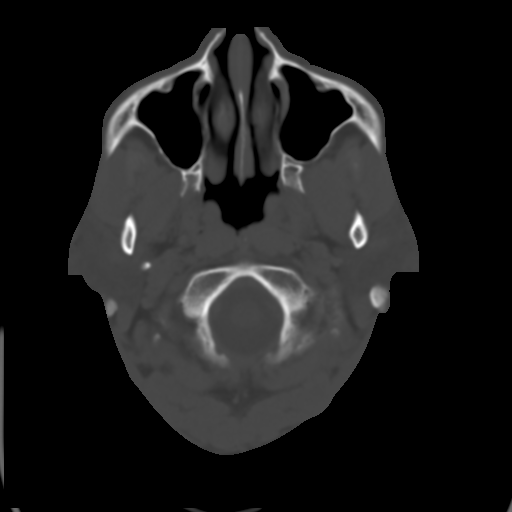
[im 4/34  brain]
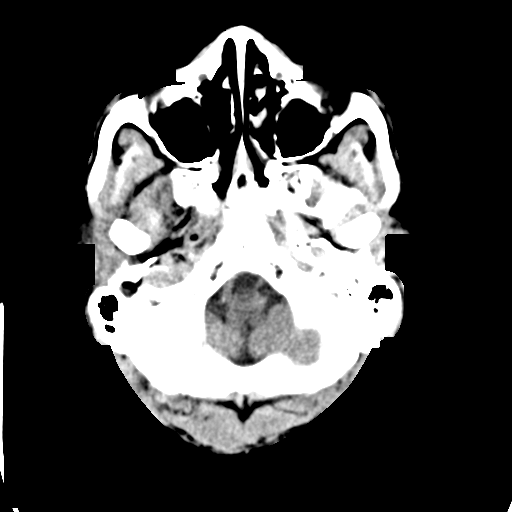
[im 6/34  brain]
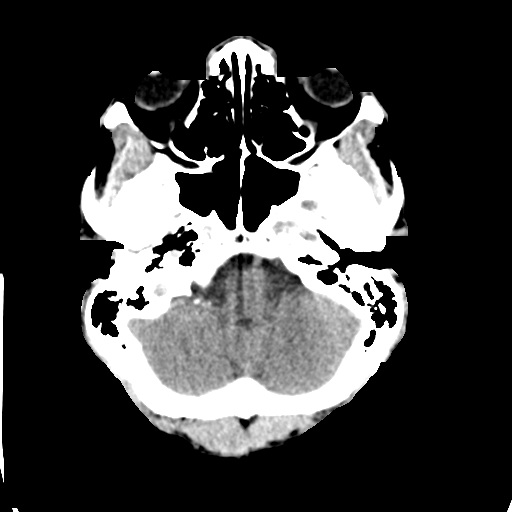
[im 8/34  brain]
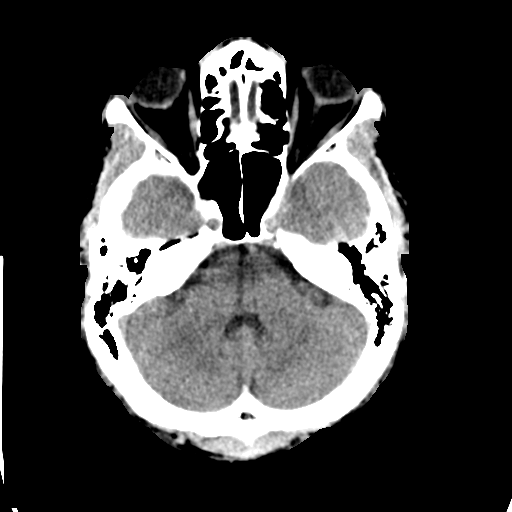
[im 10/34  brain]
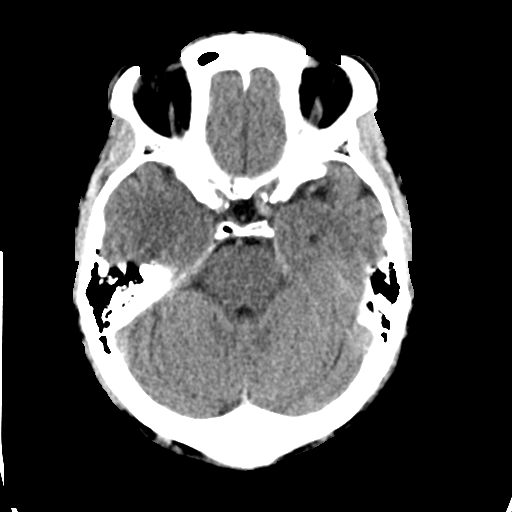
[im 10/34  bone]
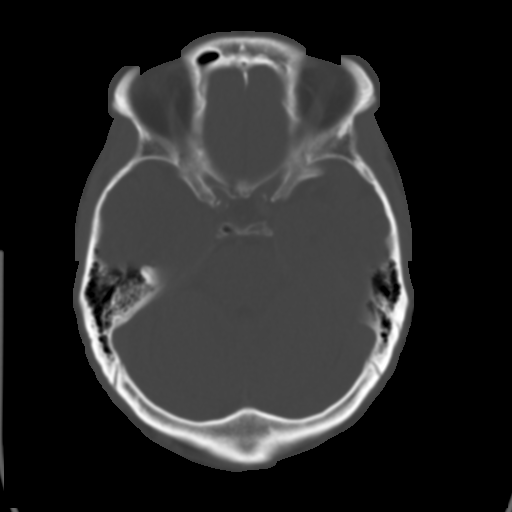
[im 12/34  brain]
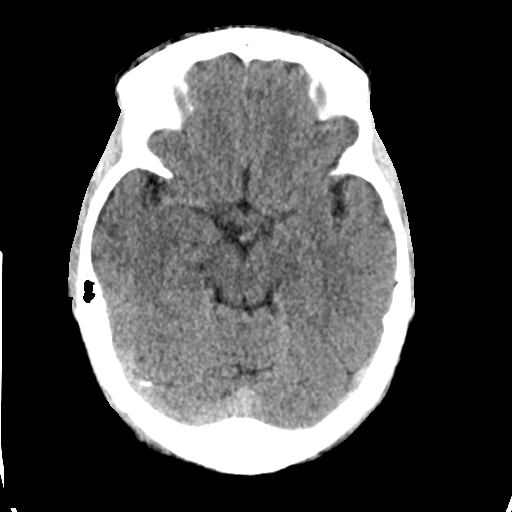
[im 14/34  brain]
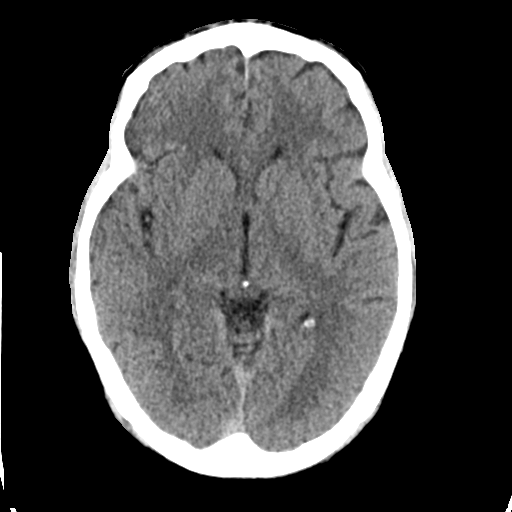
[im 16/34  brain]
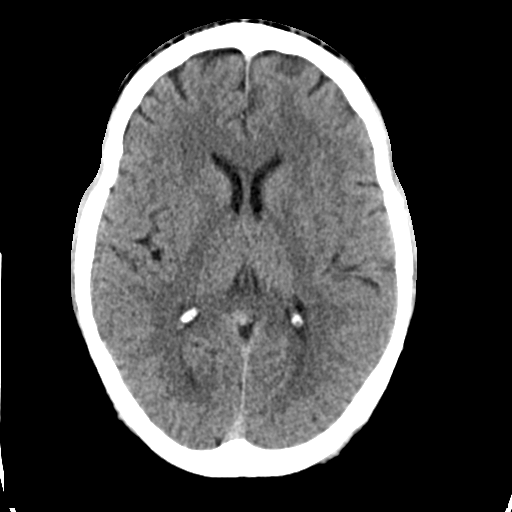
[im 18/34  brain]
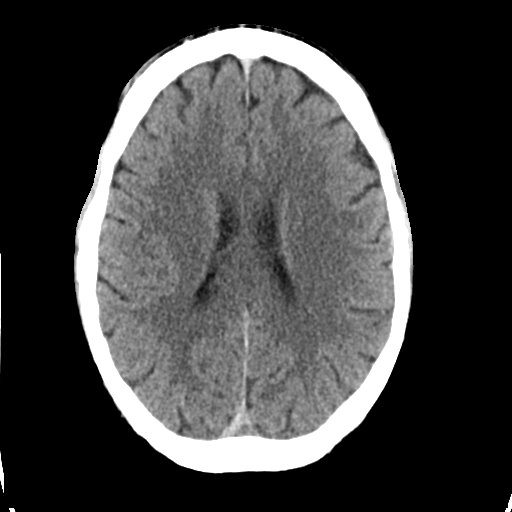
[im 18/34  bone]
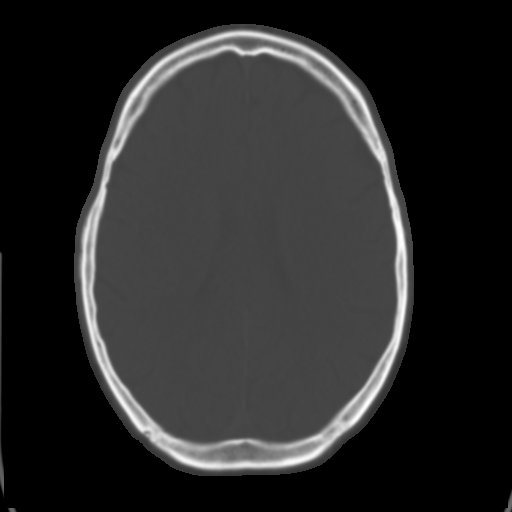
[im 20/34  brain]
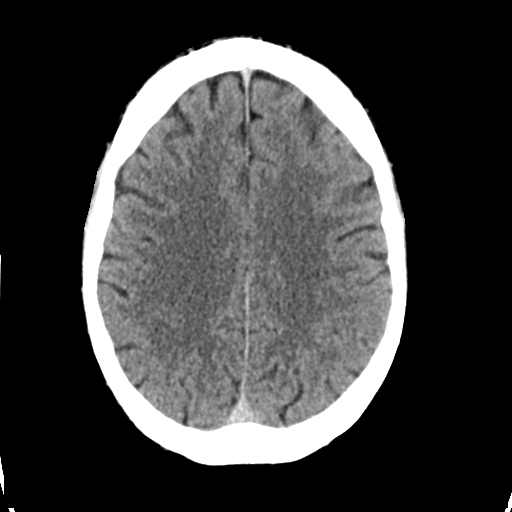
[im 22/34  brain]
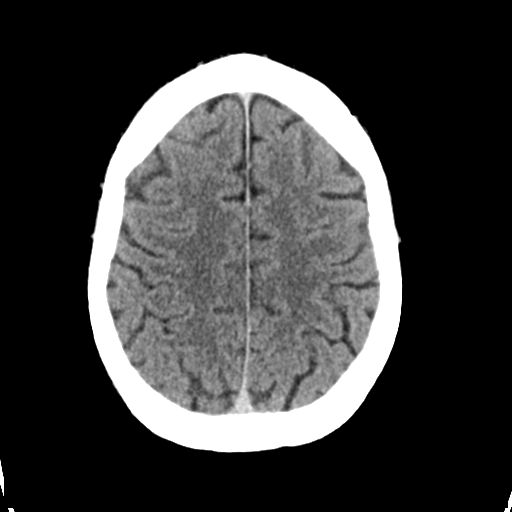
[im 24/34  brain]
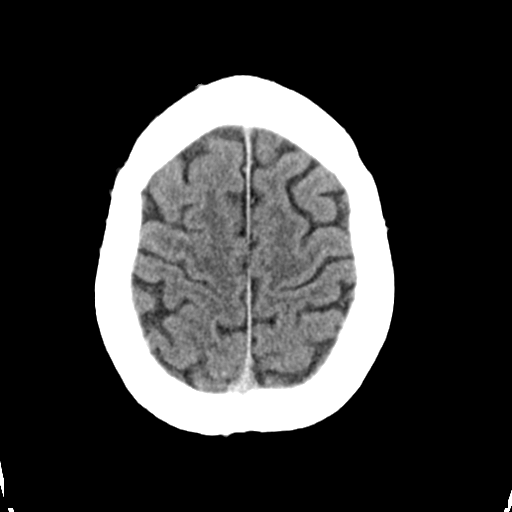
[im 26/34  brain]
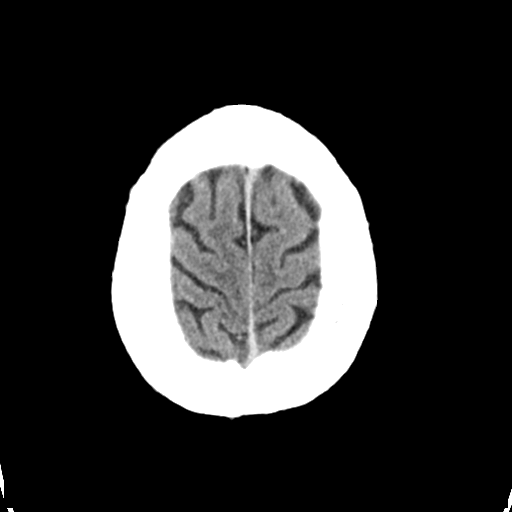
[im 26/34  bone]
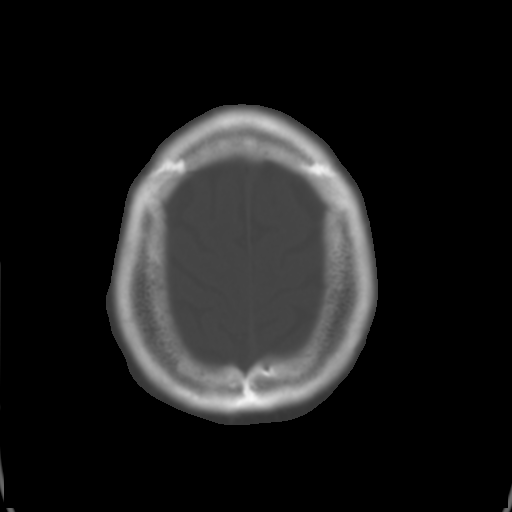
[im 28/34  brain]
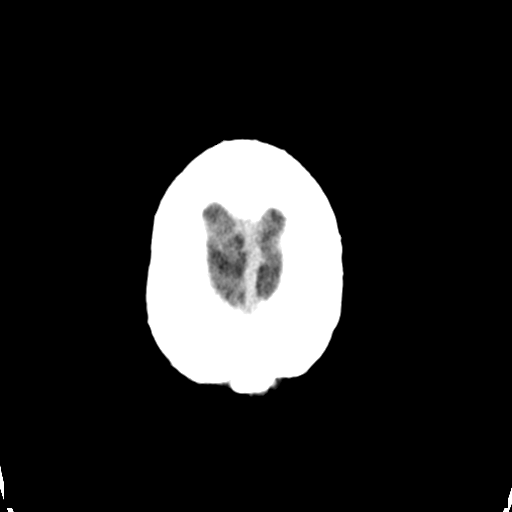
[im 30/34  brain]
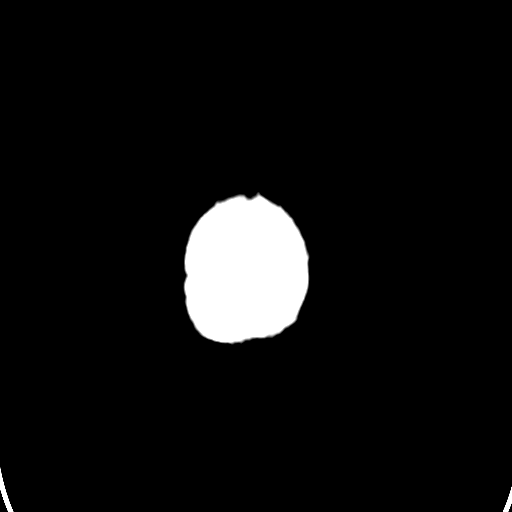
[im 32/34  brain]
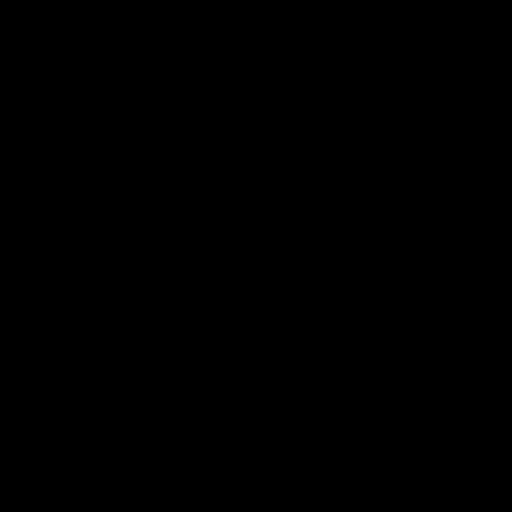

[16 of 30 positions shown; findings below may reference images not displayed]

FINDINGS: No skull fracture is noted. Paranasal sinuses and mastoid air cells
are unremarkable.

No intracranial hemorrhage, mass effect or midline shift. No acute
cortical infarction. No hydrocephalus. The gray and white-matter
differentiation is preserved. No mass lesion is noted on this
unenhanced scan.
IMPRESSION: 1. No acute intracranial abnormality.

## 2014-05-06 IMAGING — CR DG THORACIC SPINE 2V
2 series · 2 of 2 positions shown · non-contrast
Comparison: None.

CLINICAL DATA: Fell multiple steps today, pain in neck and mid
back.

EXAM:
THORACIC SPINE - 2 VIEW

[t-spine ap]
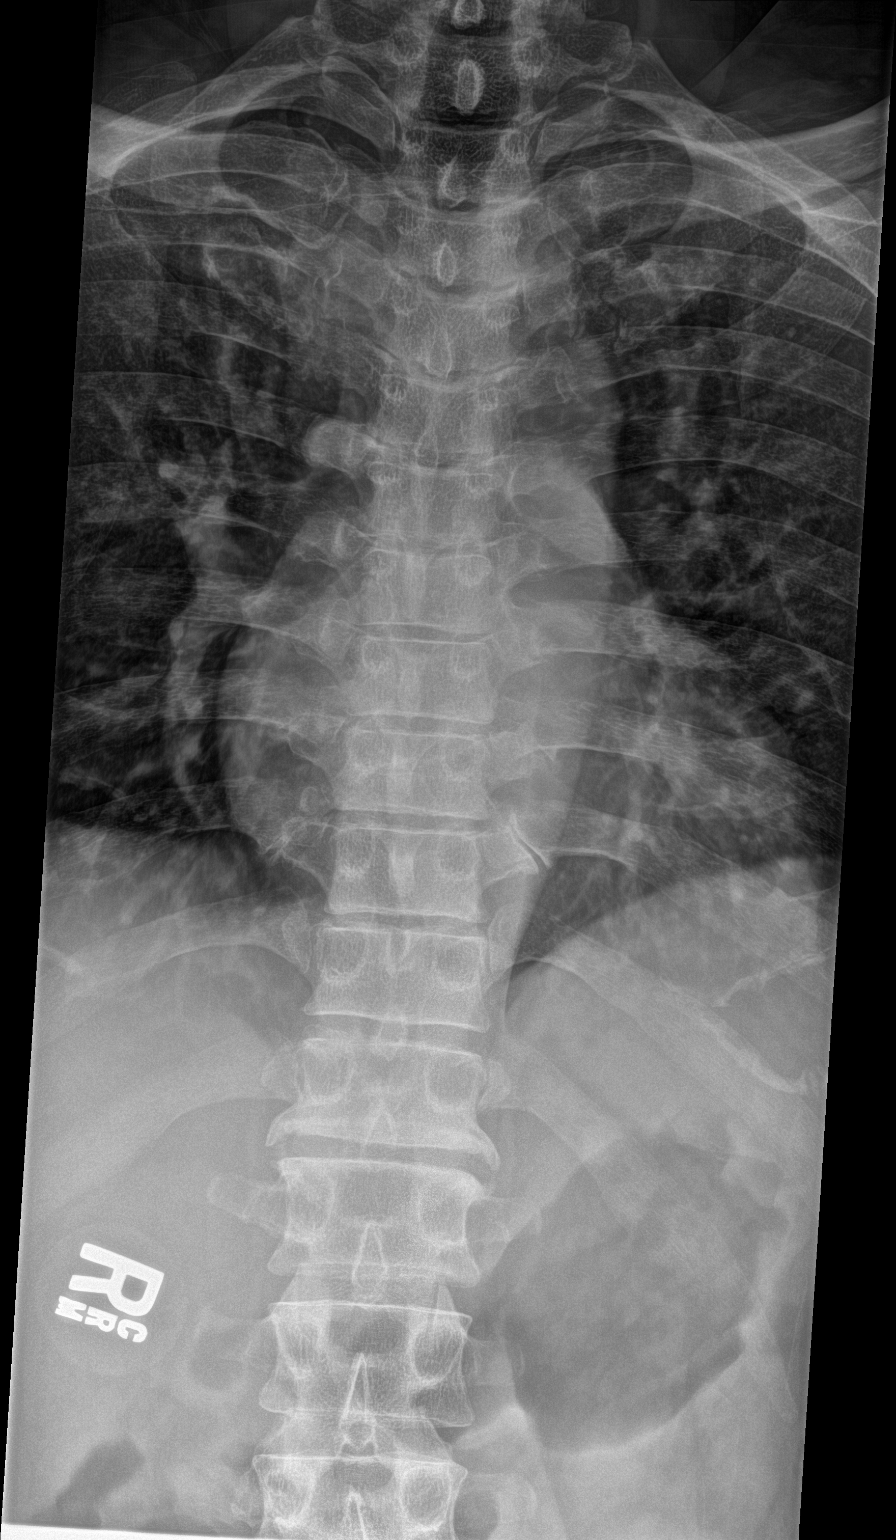

[t-spine lat]
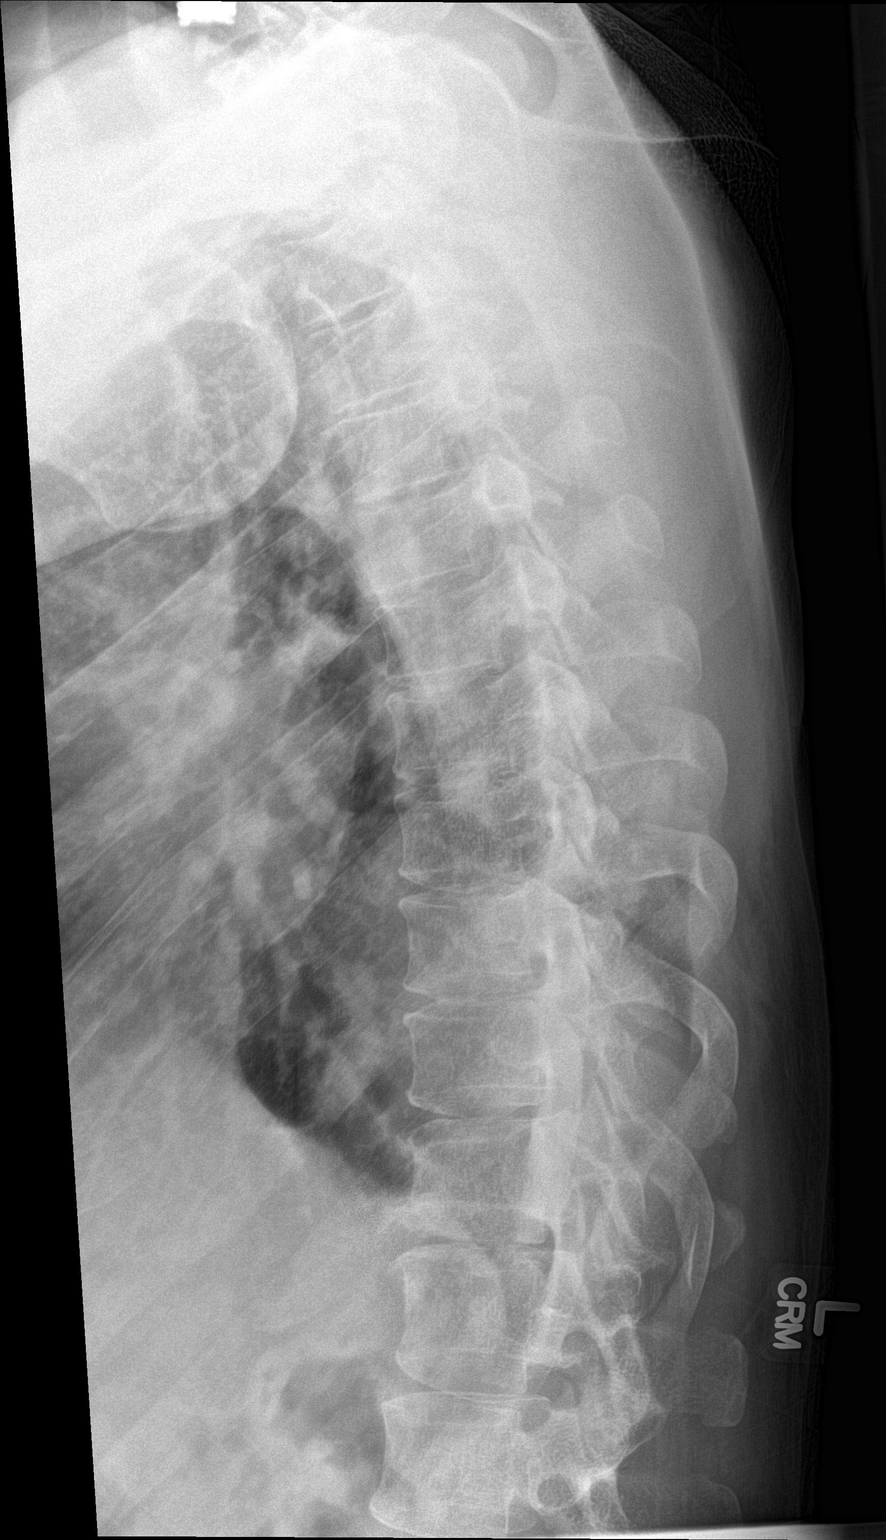

[2 of 2 positions shown; findings below may reference images not displayed]

FINDINGS: No paraspinous soft tissue swelling. No fracture. Normal alignment.
Mild multilevel degenerative disc disease.
IMPRESSION: No acute findings

## 2014-05-06 MED ORDER — IBUPROFEN 600 MG PO TABS: 600.0000 mg | ORAL_TABLET | Freq: Three times a day (TID) | ORAL | Status: DC | PRN

## 2014-05-06 MED ORDER — CYCLOBENZAPRINE HCL 10 MG PO TABS: 10.0000 mg | ORAL_TABLET | Freq: Three times a day (TID) | ORAL | Status: DC | PRN

## 2014-05-06 MED ORDER — KETOROLAC TROMETHAMINE 30 MG/ML IJ SOLN
30.0000 mg | Freq: Once | INTRAMUSCULAR | Status: AC
  Administered 2014-05-06: 30 mg via INTRAMUSCULAR
  Filled 2014-05-06: qty 1

## 2014-05-06 NOTE — ED Notes (Addendum)
Pt reports he fell down the steps this morning after he got his foot caught on his robe, hit head and neck. C/o head and neck pain. Pt is a x 4.

## 2014-05-06 NOTE — ED Provider Notes (Signed)
CSN: [id number]     Arrival date & time 05/06/14  1416 History  This chart was scribed for Levi StraussMercedes Camprubi-Soms, PA-C working with Pricilla LovelessScott Goldston, MD by Evon Slackerrance Branch, ED Scribe. This patient was seen in room TR07C/TR07C and the patient's care was started at 4:10 PM.    Chief Complaint  Patient presents with  . Fall   Patient is a 54 y.o. male presenting with fall. The history is provided by the patient. No language interpreter was used.  Fall This is a new problem. The current episode started 6 to 12 hours ago. The problem occurs rarely. The problem has not changed since onset.Associated symptoms include headaches. Pertinent negatives include no chest pain, no abdominal pain and no shortness of breath. Exacerbated by: movement. Nothing relieves the symptoms. Treatments tried: ibuprofen. The treatment provided no relief.   HPI Comments: Terry Junglingheodore Podgorski is a 54 y.o. male who presents to the Emergency Department complaining of new mechanical fall this morning. Pt states that he fell down 10-15 steps this morning after tripping over his robe landing on his left shoulder and hitting his head on the wall. Pt is complaining of constant posterior HA, thoracic back pain, neck pain, and left shoulder pain. Pt rates the severity of his pain 10/10 that's worse with movement, and states it's nonradiating from the locations where the pain is. Pt states that he has tried ibuprofen PTA with no relief. Pt denies LOC, AMS, confusion, visual changes, light headedness, dizziness, CP, SOB, abdominal pain, nausea, vomiting, saddle anesthesia, cauda equina symptoms, or  bowel/bladder incontinence. Does not take blood thinners.    Past Medical History  Diagnosis Date  . Phobia    Past Surgical History  Procedure Laterality Date  . Abdominal surgery    . Knee surgery    . I&d extremity Right 11/07/2013    Procedure: IRRIGATION AND DEBRIDEMENT EXTREMITY;  Surgeon: Betha LoaKevin Kuzma, MD;  [facility];  Service:  Orthopedics;  Laterality: Right;   No family history on file. History  Substance Use Topics  . Smoking status: Current Every Day Smoker -- 1.00 packs/day    Types: Cigarettes  . Smokeless tobacco: Not on file  . Alcohol Use: Yes    Review of Systems  Eyes: Negative for visual disturbance.  Respiratory: Negative for shortness of breath.   Cardiovascular: Negative for chest pain.  Gastrointestinal: Negative for nausea, vomiting and abdominal pain.  Musculoskeletal: Positive for back pain, arthralgias and neck pain.  Skin: Negative for wound.  Neurological: Positive for headaches. Negative for syncope, weakness and light-headedness.  Psychiatric/Behavioral: Negative for confusion.  10 Systems reviewed and all are negative for acute change except as noted in the HPI.    Allergies  Naproxen  Home Medications   Prior to Admission medications   Medication Sig Start Date End Date Taking? Authorizing Provider  acetaminophen (TYLENOL) 325 MG tablet Take 325 mg by mouth every 6 (six) hours as needed for mild pain or moderate pain.    Historical Provider, MD  hydrOXYzine (ATARAX/VISTARIL) 25 MG tablet Take 1 tablet (25 mg total) by mouth every 8 (eight) hours as needed for anxiety. 09/28/13   Fayrene HelperBowie Tran, PA-C  oxyCODONE-acetaminophen (PERCOCET) 5-325 MG per tablet 1-2 tabs po q6 hours prn pain 11/07/13   Betha LoaKevin Kuzma, MD  Phenyleph-CPM-DM-APAP (ALKA-SELTZER PLUS COLD & COUGH) 05-18-08-325 MG CAPS Take 2 capsules by mouth once.    Historical Provider, MD  sulfamethoxazole-trimethoprim (SEPTRA DS) 800-160 MG per tablet Take 1 tablet by mouth 2 (  two) times daily. 11/06/13   Arby Barrette, MD   BP 139/92 mmHg  Pulse 85  Temp(Src) 98.3 F (36.8 C) (Oral)  Resp 20  SpO2 97%   Physical Exam  Constitutional: He is oriented to person, place, and time. Vital signs are normal. He appears well-developed and well-nourished.  Non-toxic appearance. No distress.  Afebrile, nontoxic, NAD  HENT:   Head: Normocephalic and atraumatic. Head is without raccoon's eyes, without Battle's sign and without abrasion.    Mouth/Throat: Oropharynx is clear and moist and mucous membranes are normal. Normal dentition.  No raccoon eyes or battle's sign, no abrasions or contusions, [place]/AT. Mild tenderness to L posterior occiput without bony crepitus. No facial swelling or TTP. No dentitia injuries  Eyes: Conjunctivae and EOM are normal. Pupils are equal, round, and reactive to light. Right eye exhibits no discharge. Left eye exhibits no discharge.  PERRL, EOMI, no nystagmus, no visual field deficits   Neck: Neck supple. Spinous process tenderness and muscular tenderness present. No rigidity. Decreased range of motion (due to pain) present.    FROM intact with mild diffuse spinous process and  paraspinous muscle TTP, no bony stepoffs or deformities, mild b/l paraspinous muscle spasms. No rigidity or meningeal signs. No bruising or swelling.   Cardiovascular: Normal rate and intact distal pulses.   Pulmonary/Chest: Effort normal. No respiratory distress.  Abdominal: Normal appearance. He exhibits no distension.  Musculoskeletal: He exhibits tenderness.       Thoracic back: He exhibits decreased range of motion (due to pain. ), tenderness, bony tenderness and spasm. He exhibits no swelling and no deformity.       Back:  L shoulder with FROM intact, no bony TTP, mild deltoid and posterior muscular TTP and spasms, no swelling/effusion, no crepitus/deformity, negative apley scratch, neg pain with resisted int rotation, positive pain with resisted ext rotation, positive empty can test. Strength and sensation grossly intact in all extremities, distal pulses intact.   Thoracic spine with slightly diminished ROM due to pain with mild diffuse spinous process TTP, no bony stepoffs or deformities, diffuse L sided paraspinous muscle TTP and muscle spasms. Strength 5/5 in all extremities, sensation grossly intact in all  extremities, gait steady and nonantalgic. No overlying skin changes.   Neurological: He is alert and oriented to person, place, and time. He has normal strength. No cranial nerve deficit or sensory deficit. Gait normal. GCS eye subscore is 4. GCS verbal subscore is 5. GCS motor subscore is 6.  CN 2-12 grossly intact A&O x4 GCS 15 Sensation and strength intact Gait nonataxic  Skin: Skin is warm, dry and intact. No rash noted.  No bruising or swelling  Psychiatric: He has a normal mood and affect.  Nursing note and vitals reviewed.  ED Course  Procedures   DIAGNOSTIC STUDIES: Oxygen Saturation is 97% on RA, normal by my interpretation.    COORDINATION OF CARE: 4:25 PM Discussed treatment plan with pt at bedside and pt agreed to plan.   Labs Review Labs Reviewed - No data to display  Imaging Review Dg Cervical Spine Complete  05/06/2014   CLINICAL DATA:  Larey Seat down multiple steps today, neck and mid back pain  EXAM: CERVICAL SPINE  4+ VIEWS  COMPARISON:  None  FINDINGS: Examination performed upright in-collar.  The presence of a collar on upright images of the cervical spine may prevent identification of ligamentous and unstable injuries.  Straightening of cervical lordosis question muscle spasm.  Prevertebral soft tissues normal thickness.  Bones  appear slightly demineralized.  Multilevel disc space narrowing and endplate spur formation.  Minimal retrolisthesis C5-C6.  Scattered facet degenerative changes.  Encroachment upon cervical neural foramina bilaterally at multiple levels most prominently at C5-C6 and C6-C7.  Vertebral body heights maintained without fracture or subluxation.  No bone destruction.  C1-C2 alignment normal.  Tips of lung apices clear.a  Curvilinear radiopacity in the prevertebral soft tissues at T1 is not identified on remaining views, consistent with artifact.  IMPRESSION: Degenerative disc disease changes cervical spine as above.  No acute cervical spine abnormalities  identified on upright in-collar cervical spine series as discussed above.   Electronically Signed   By: Ulyses Southward M.D.   On: 05/06/2014 15:43   Dg Thoracic Spine 2 View  05/06/2014   CLINICAL DATA:  Larey Seat multiple steps today, pain in neck and mid back.  EXAM: THORACIC SPINE - 2 VIEW  COMPARISON:  None.  FINDINGS: No paraspinous soft tissue swelling. No fracture. Normal alignment. Mild multilevel degenerative disc disease.  IMPRESSION: No acute findings   Electronically Signed   By: Esperanza Heir M.D.   On: 05/06/2014 15:42   Ct Head Wo Contrast  05/06/2014   CLINICAL DATA:  Fall today, hit posterior head  EXAM: CT HEAD WITHOUT CONTRAST  TECHNIQUE: Contiguous axial images were obtained from the base of the skull through the vertex without intravenous contrast.  COMPARISON:  None.  FINDINGS: No skull fracture is noted. Paranasal sinuses and mastoid air cells are unremarkable.  No intracranial hemorrhage, mass effect or midline shift. No acute cortical infarction. No hydrocephalus. The gray and white-matter differentiation is preserved. No mass lesion is noted on this unenhanced scan.  IMPRESSION: 1. No acute intracranial abnormality.   Electronically Signed   By: Natasha Mead M.D.   On: 05/06/2014 16:03     EKG Interpretation None      MDM   Final diagnoses:  Left-sided thoracic back pain  Neck pain on left side  Muscle spasms of neck  Acute nonintractable headache, unspecified headache type  Fall, initial encounter    55 y.o. male here with neck/thoracic back pain and spasm, and headache, after a fall down 10-15 stairs. Mild midline tenderness, therefore imaging was ordered in triage and was negative for acute injury. No LOC, head CT neg. No red flag s/s of back pain or HA. No s/s of central cord compression or cauda equina, no focal neuro deficits. All extremities are neurovascularly intact and patient is ambulating without difficulty. Given toradol here for pain relief, since pt is driving  unable to give any meds that may cause drowsiness.  Patient was counseled on back pain precautions and told to do activity as tolerated but do not lift, push, or pull heavy objects more than 10 pounds for the next week. Patient counseled to use ice or heat on back for no longer than 15 minutes every hour.   Rx given for muscle relaxer and counseled on proper use of muscle relaxant medication. Urged patient not to drink alcohol, drive, or perform any other activities that requires focus while taking this medication. Discussed concussion treatment with mental rest. Discussed ice/heat use.  Patient urged to follow-up with Urgent care if pain does not improve with treatment and rest or if pain becomes recurrent. Urged to return with worsening severe pain, loss of bowel or bladder control, trouble walking. The patient verbalizes understanding and agrees with the plan.   I personally performed the services described in this documentation, which was  scribed in my presence. The recorded information has been reviewed and is accurate.  BP 139/92 mmHg  Pulse 85  Temp(Src) 98.3 F (36.8 C) (Oral)  Resp 20  SpO2 97%  Meds ordered this encounter  Medications  . ketorolac (TORADOL) 30 MG/ML injection 30 mg    Sig:   . cyclobenzaprine (FLEXERIL) 10 MG tablet    Sig: Take 1 tablet (10 mg total) by mouth 3 (three) times daily as needed for muscle spasms.    Dispense:  15 tablet    Refill:  0    Order Specific Question:  Supervising Provider    Answer:  MILLER, BRIAN [[id number]]  . ibuprofen (ADVIL,MOTRIN) 600 MG tablet    Sig: Take 1 tablet (600 mg total) by mouth every 8 (eight) hours as needed for fever, headache, moderate pain or cramping.    Dispense:  30 tablet    Refill:  0    Order Specific Question:  Supervising Provider    Answer:  Eber Hong [[id number]]       Terry Barros Camprubi-Soms, PA-C 05/06/14 1656  Pricilla Loveless, MD 05/10/14 [phone]

## 2014-05-06 NOTE — Discharge Instructions (Signed)
Take ibuprofen as directed for inflammation and pain with tylenol for breakthrough pain and flexeril for muscle relaxation. Do not drive or operate machinery with muscle relaxation use. Ice to areas of soreness for the next few days and then may move to heat, no more than 20 minutes at a time for each. Expect to be sore for the next few days and follow up with [facility] for recheck of ongoing symptoms. Get plenty of rest, use ice on your head.  Keep your child in a quiet, not simulating, dark environment. No TV, computer use, video games until headache is resolved completely. Follow Up with [facility] in 3-4 days if headache persists.  Return to the emergency department if patient becomes lethargic, begins vomiting or other change in mental status.    Back Pain, Adult Low back pain is very common. About 1 in 5 people have back pain.The cause of low back pain is rarely dangerous. The pain often gets better over time.About half of people with a sudden onset of back pain feel better in just 2 weeks. About 8 in 10 people feel better by 6 weeks.  CAUSES Some common causes of back pain include:  Strain of the muscles or ligaments supporting the spine.  Wear and tear (degeneration) of the spinal discs.  Arthritis.  Direct injury to the back. DIAGNOSIS Most of the time, the direct cause of low back pain is not known.However, back pain can be treated effectively even when the exact cause of the pain is unknown.Answering your caregiver's questions about your overall health and symptoms is one of the most accurate ways to make sure the cause of your pain is not dangerous. If your caregiver needs more information, he or she may order lab work or imaging tests (X-rays or MRIs).However, even if imaging tests show changes in your back, this usually does not require surgery. HOME CARE INSTRUCTIONS For many people, back pain returns.Since low back pain is rarely dangerous,  it is often a condition that people can learn to [facility] their own.   Remain active. It is stressful on the back to sit or stand in one place. Do not sit, drive, or stand in one place for more than 30 minutes at a time. Take short walks on level surfaces as soon as pain allows.Try to increase the length of time you walk each day.  Do not stay in bed.Resting more than 1 or 2 days can delay your recovery.  Do not avoid exercise or work.Your body is made to move.It is not dangerous to be active, even though your back may hurt.Your back will likely heal faster if you return to being active before your pain is gone.  Pay attention to your body when you bend and lift. Many people have less discomfortwhen lifting if they bend their knees, keep the load close to their bodies,and avoid twisting. Often, the most comfortable positions are those that put less stress on your recovering back.  Find a comfortable position to sleep. Use a firm mattress and lie on your side with your knees slightly bent. If you lie on your back, put a pillow under your knees.  Only take over-the-counter or prescription medicines as directed by your caregiver. Over-the-counter medicines to reduce pain and inflammation are often the most helpful.Your caregiver may prescribe muscle relaxant drugs.These medicines help dull your pain so you can more quickly return to your normal activities and healthy exercise.  Put ice on the  injured area.  Put ice in a plastic bag.  Place a towel between your skin and the bag.  Leave the ice on for 15-20 minutes, 03-04 times a day for the first 2 to 3 days. After that, ice and heat may be alternated to reduce pain and spasms.  Ask your caregiver about trying back exercises and gentle massage. This may be of some benefit.  Avoid feeling anxious or stressed.Stress increases muscle tension and can worsen back pain.It is important to recognize when you are anxious or stressed and learn  ways to manage it.Exercise is a great option. SEEK MEDICAL CARE IF:  You have pain that is not relieved with rest or medicine.  You have pain that does not improve in 1 week.  You have new symptoms.  You are generally not feeling well. SEEK IMMEDIATE MEDICAL CARE IF:   You have pain that radiates from your back into your legs.  You develop new bowel or bladder control problems.  You have unusual weakness or numbness in your arms or legs.  You develop nausea or vomiting.  You develop abdominal pain.  You feel faint. Document Released: 01/03/2005 Document Revised: 07/05/2011 Document Reviewed: 05/07/2013 [facility] Patient Information 2015 [place], [place]. This information is not intended to replace advice given to you by your health care provider. Make sure you discuss any questions you have with your health care provider.  Cervical Sprain A cervical sprain is an injury in the neck in which the strong, fibrous tissues (ligaments) that connect your neck bones stretch or tear. Cervical sprains can range from mild to severe. Severe cervical sprains can cause the neck vertebrae to be unstable. This can lead to damage of the spinal cord and can result in serious nervous system problems. The amount of time it takes for a cervical sprain to get better depends on the cause and extent of the injury. Most cervical sprains heal in 1 to 3 weeks. CAUSES  Severe cervical sprains may be caused by:   Contact sport injuries (such as from football, rugby, wrestling, hockey, auto racing, gymnastics, diving, martial arts, or boxing).   Motor vehicle collisions.   Whiplash injuries. This is an injury from a sudden forward and backward whipping movement of the head and neck.  Falls.  Mild cervical sprains may be caused by:   Being in an awkward position, such as while cradling a telephone between your ear and shoulder.   Sitting in a chair that does not offer proper support.   Working at a  poorly [employer] executive station.   Looking up or down for long periods of time.  SYMPTOMS   Pain, soreness, stiffness, or a burning sensation in the front, back, or sides of the neck. This discomfort may develop immediately after the injury or slowly, 24 hours or more after the injury.   Pain or tenderness directly in the middle of the back of the neck.   Shoulder or upper back pain.   Limited ability to move the neck.   Headache.   Dizziness.   Weakness, numbness, or tingling in the hands or arms.   Muscle spasms.   Difficulty swallowing or chewing.   Tenderness and swelling of the neck.  DIAGNOSIS  Most of the time your health care provider can diagnose a cervical sprain by taking your history and doing a physical exam. Your health care provider will ask about previous neck injuries and any known neck problems, such as arthritis in the neck. X-rays may  be taken to find out if there are any other problems, such as with the bones of the neck. Other tests, such as a CT scan or MRI, may also be needed.  TREATMENT  Treatment depends on the severity of the cervical sprain. Mild sprains can be treated with rest, keeping the neck in place (immobilization), and pain medicines. Severe cervical sprains are immediately immobilized. Further treatment is done to help with pain, muscle spasms, and other symptoms and may include:  Medicines, such as pain relievers, numbing medicines, or muscle relaxants.   Physical therapy. This may involve stretching exercises, strengthening exercises, and posture training. Exercises and improved posture can help stabilize the neck, strengthen muscles, and help stop symptoms from returning.  HOME CARE INSTRUCTIONS   Put ice on the injured area.   Put ice in a plastic bag.   Place a towel between your skin and the bag.   Leave the ice on for 15-20 minutes, 3-4 times a day.   If your injury was severe, you may have been given a cervical  collar to wear. A cervical collar is a two-piece collar designed to keep your neck from moving while it heals.  Do not remove the collar unless instructed by your health care provider.  If you have long hair, keep it outside of the collar.  Ask your health care provider before making any adjustments to your collar. Minor adjustments may be required over time to improve comfort and reduce pressure on your chin or on the back of your head.  Ifyou are allowed to remove the collar for cleaning or bathing, follow your health care provider's instructions on how to do so safely.  Keep your collar clean by wiping it with mild soap and water and drying it completely. If the collar you have been given includes removable pads, remove them every 1-2 days and hand wash them with soap and water. Allow them to air dry. They should be completely dry before you wear them in the collar.  If you are allowed to remove the collar for cleaning and bathing, wash and dry the skin of your neck. Check your skin for irritation or sores. If you see any, tell your health care provider.  Do not drive while wearing the collar.   Only take over-the-counter or prescription medicines for pain, discomfort, or fever as directed by your health care provider.   Keep all follow-up appointments as directed by your health care provider.   Keep all physical therapy appointments as directed by your health care provider.   Make any needed adjustments to your workstation to promote good posture.   Avoid positions and activities that make your symptoms worse.   Warm up and stretch before being active to help prevent problems.  SEEK MEDICAL CARE IF:   Your pain is not controlled with medicine.   You are unable to decrease your pain medicine over time as planned.   Your activity level is not improving as expected.  SEEK IMMEDIATE MEDICAL CARE IF:   You develop any bleeding.  You develop stomach upset.  You have  signs of an allergic reaction to your medicine.   Your symptoms get worse.   You develop new, unexplained symptoms.   You have numbness, tingling, weakness, or paralysis in any part of your body.  MAKE SURE YOU:   Understand these instructions.  Will watch your condition.  Will get help right away if you are not doing well or get worse. Document Released:  10/31/2006 Document Revised: 01/08/2013 Document Reviewed: 07/11/2012 [facility] Patient Information 2015 [place], [place]. This information is not intended to replace advice given to you by your health care provider. Make sure you discuss any questions you have with your health care provider.  General Headache Without Cause A general headache is pain or discomfort felt around the head or neck area. The cause may not be found.  HOME CARE   Keep all doctor visits.  Only take medicines as told by your doctor.  Lie down in a dark, quiet room when you have a headache.  Keep a journal to find out if certain things bring on headaches. For example, write down:  What you eat and drink.  How much sleep you get.  Any change to your diet or medicines.  Relax by getting a massage or doing other relaxing activities.  Put ice or heat packs on the head and neck area as told by your doctor.  Lessen stress.  Sit up straight. Do not tighten (tense) your muscles.  Quit smoking if you smoke.  Lessen how much alcohol you drink.  Lessen how much caffeine you drink, or stop drinking caffeine.  Eat and sleep on a regular schedule.  Get 7 to 9 hours of sleep, or as told by your doctor.  Keep lights dim if bright lights bother you or make your headaches worse. GET HELP RIGHT AWAY IF:   Your headache becomes really bad.  You have a fever.  You have a stiff neck.  You have trouble seeing.  Your muscles are weak, or you lose muscle control.  You lose your balance or have trouble walking.  You feel like you will pass out  (faint), or you pass out.  You have really bad symptoms that are different than your first symptoms.  You have problems with the medicines given to you by your doctor.  Your medicines do not work.  Your headache feels different than the other headaches.  You feel sick to your stomach (nauseous) or throw up (vomit). MAKE SURE YOU:   Understand these instructions.  Will watch your condition.  Will get help right away if you are not doing well or get worse. Document Released: 10/13/2007 Document Revised: 03/28/2011 Document Reviewed: 12/24/2010 [facility] Patient Information 2015 [place], [place]. This information is not intended to replace advice given to you by your health care provider. Make sure you discuss any questions you have with your health care provider.  Heat Therapy Heat therapy can help ease sore, stiff, injured, and tight muscles and joints. Heat relaxes your muscles, which may help ease your pain.  RISKS AND COMPLICATIONS If you have any of the following conditions, do not use heat therapy unless your health care provider has approved:  Poor circulation.  Healing wounds or scarred skin in the area being treated.  Diabetes, heart disease, or high blood pressure.  Not being able to feel (numbness) the area being treated.  Unusual swelling of the area being treated.  Active infections.  Blood clots.  Cancer.  Inability to communicate pain. This may include young children and people who have problems with their brain function (dementia).  Pregnancy. Heat therapy should only be used on old, pre-existing, or long-lasting (chronic) injuries. Do not use heat therapy on new injuries unless directed by your health care provider. HOW TO USE HEAT THERAPY There are several different kinds of heat therapy, including:  Moist heat pack.  Warm water bath.  Hot water bottle.  Electric heating  pad.  Heated gel pack.  Heated wrap.  Electric heating pad. Use the  heat therapy method suggested by your health care provider. Follow your health care provider's instructions on when and how to use heat therapy. GENERAL HEAT THERAPY RECOMMENDATIONS  Do not sleep while using heat therapy. Only use heat therapy while you are awake.  Your skin may turn pink while using heat therapy. Do not use heat therapy if your skin turns red.  Do not use heat therapy if you have new pain.  High heat or long exposure to heat can cause burns. Be careful when using heat therapy to avoid burning your skin.  Do not use heat therapy on areas of your skin that are already irritated, such as with a rash or sunburn. SEEK MEDICAL CARE IF:  You have blisters, redness, swelling, or numbness.  You have new pain.  Your pain is worse. MAKE SURE YOU:  Understand these instructions.  Will watch your condition.  Will get help right away if you are not doing well or get worse. Document Released: 03/28/2011 Document Revised: 05/20/2013 Document Reviewed: 02/26/2013 [facility] Patient Information 2015 [place], [place]. This information is not intended to replace advice given to you by your health care provider. Make sure you discuss any questions you have with your health care provider.

## 2014-05-06 NOTE — ED Notes (Signed)
Patient transported to X-ray 

## 2014-09-17 ENCOUNTER — Encounter (HOSPITAL_COMMUNITY): Payer: Self-pay | Admitting: Emergency Medicine

## 2014-09-17 ENCOUNTER — Emergency Department (INDEPENDENT_AMBULATORY_CARE_PROVIDER_SITE_OTHER)
Admission: EM | Admit: 2014-09-17 | Discharge: 2014-09-17 | Disposition: A | Discharge status: Home / Self Care | Payer: PRIVATE HEALTH INSURANCE | Source: Home / Self Care | Attending: Family Medicine | Admitting: Family Medicine

## 2014-09-17 DIAGNOSIS — S0502XA Injury of conjunctiva and corneal abrasion without foreign body, left eye, initial encounter: Secondary | ICD-10-CM | POA: Diagnosis not present

## 2014-09-17 MED ORDER — FLUORESCEIN SODIUM 1 MG OP STRP
ORAL_STRIP | OPHTHALMIC | Status: AC
  Filled 2014-09-17: qty 4

## 2014-09-17 MED ORDER — TETRACAINE HCL 0.5 % OP SOLN
OPHTHALMIC | Status: AC
  Filled 2014-09-17: qty 2

## 2014-09-17 MED ORDER — POLYMYXIN B-TRIMETHOPRIM 10000-0.1 UNIT/ML-% OP SOLN: 2.0000 [drp] | Freq: Four times a day (QID) | OPHTHALMIC | Status: DC

## 2014-09-17 MED ORDER — FLUORESCEIN SODIUM 1 MG OP STRP
ORAL_STRIP | OPHTHALMIC | Status: AC
  Filled 2014-09-17: qty 1

## 2014-09-17 NOTE — ED Provider Notes (Signed)
CSN: [id number]     Arrival date & time 09/17/14  1725 History   First MD Initiated Contact with Patient 09/17/14 1747     Chief Complaint  Patient presents with  . Eye Injury   (Consider location/radiation/quality/duration/timing/severity/associated sxs/prior Treatment) HPI  L eye injury. 1 day ago pt had an insect fly directly into eye. Eye became red and has stayed that way since the injury. Today pt was struck in the same eye by a tree branch. Problem is constant. Nothing makes his symptoms better. Nothing makes his symptoms worse. Overall condition is unchanged since onset. Blurry vision, but improves w/ wiping.  Discharge started today..  Denies any periorbital swelling, nausea, vomiting, vision loss, headache, neck stiffness.   Past Medical History  Diagnosis Date  . Phobia    Past Surgical History  Procedure Laterality Date  . Abdominal surgery    . Knee surgery    . I&d extremity Right 11/07/2013    Procedure: IRRIGATION AND DEBRIDEMENT EXTREMITY;  Surgeon: Betha Loa, MD;  [facility];  Service: Orthopedics;  Laterality: Right;   Family History  Problem Relation Age of Onset  . Diabetes Other   . Hypertension Other    Social History  Substance Use Topics  . Smoking status: Current Every Day Smoker -- 1.00 packs/day    Types: Cigarettes  . Smokeless tobacco: None  . Alcohol Use: Yes    Review of Systems Per HPI with all other pertinent systems negative.   Allergies  Naproxen  Home Medications   Prior to Admission medications   Medication Sig Start Date End Date Taking? Authorizing Provider  sulfamethoxazole-trimethoprim (SEPTRA DS) 800-160 MG per tablet Take 1 tablet by mouth 2 (two) times daily. 11/06/13   Arby Barrette, MD  trimethoprim-polymyxin b (POLYTRIM) ophthalmic solution Place 2 drops into the left eye every 6 (six) hours. Treat for 5-7 days 09/17/14   Ozella Rocks, MD   Meds Ordered and Administered this Visit  Medications - No data to  display  BP 128/87 mmHg  Pulse 67  Temp(Src) 97.8 F (36.6 C) (Oral)  Resp 16  SpO2 97% No data found.   Physical Exam Physical Exam  Constitutional: oriented to person, place, and time. appears well-developed and well-nourished. No distress.  HENT:  Head: Normocephalic and atraumatic.  Eyes: EOMI. PERRL.  fluoroscopy reveals vertical corneal abrasion at the 7:00 position. Cardiovascular: RRR, no m/r/g, 2+ distal pulses,  Pulmonary/Chest: Effort normal and breath sounds normal. No respiratory distress.  Abdominal: Soft. Bowel sounds are normal. NonTTP, no distension.  Musculoskeletal: Normal range of motion. Non ttp, no effusion.  Neurological: alert and oriented to person, place, and time.  Skin: Skin is warm. No rash noted. non diaphoretic.  Psychiatric: normal mood and affect. behavior is normal. Judgment and thought content normal.   ED Course  Procedures (including critical care time)  Labs Review Labs Reviewed - No data to display  Imaging Review No results found.   Visual Acuity Review  Right Eye Distance:   Left Eye Distance:   Bilateral Distance:    Right Eye Near:   Left Eye Near:    Bilateral Near:         MDM   1. Corneal abrasion, left, initial encounter    Polytrim drops, NSAIDs. Work note provided.     Ozella Rocks, MD 09/17/14 [phone]

## 2014-09-17 NOTE — Discharge Instructions (Signed)
You have developed a corneal abrasion. This is a small scratch to the eye. Please use the anabolic drops as prescribed. Please go to the emergency room or an ophthalmology office if you're not better.   Corneal Abrasion The cornea is the clear covering at the front and center of the eye. When looking at the colored portion of the eye (iris), you are looking through the cornea. This very thin tissue is made up of many layers. The surface layer is a single layer of cells (corneal epithelium) and is one of the most sensitive tissues in the body. If a scratch or injury causes the corneal epithelium to come off, it is called a corneal abrasion. If the injury extends to the tissues below the epithelium, the condition is called a corneal ulcer. CAUSES   Scratches.  Trauma.  Foreign body in the eye. Some people have recurrences of abrasions in the area of the original injury even after it has healed (recurrent erosion syndrome). Recurrent erosion syndrome generally improves and goes away with time. SYMPTOMS   Eye pain.  Difficulty or inability to keep the injured eye open.  The eye becomes very sensitive to light.  Recurrent erosions tend to happen suddenly, first thing in the morning, usually after waking up and opening the eye. DIAGNOSIS  Your health care provider can diagnose a corneal abrasion during an eye exam. Dye is usually placed in the eye using a drop or a small paper strip moistened by your tears. When the eye is examined with a special light, the abrasion shows up clearly because of the dye. TREATMENT   Small abrasions may be treated with antibiotic drops or ointment alone.  A pressure patch may be put over the eye. If this is done, follow your doctor's instructions for when to remove the patch. Do not drive or use machines while the eye patch is on. Judging distances is hard to do with a patch on. If the abrasion becomes infected and spreads to the deeper tissues of the cornea, a  corneal ulcer can result. This is serious because it can cause corneal scarring. Corneal scars interfere with light passing through the cornea and cause a loss of vision in the involved eye. HOME CARE INSTRUCTIONS  Use medicine or ointment as directed. Only take over-the-counter or prescription medicines for pain, discomfort, or fever as directed by your health care provider.  Do not drive or operate machinery if your eye is patched. Your ability to judge distances is impaired.  If your health care provider has given you a follow-up appointment, it is very important to keep that appointment. Not keeping the appointment could result in a severe eye infection or permanent loss of vision. If there is any problem keeping the appointment, let your health care provider know. SEEK MEDICAL CARE IF:   You have pain, light sensitivity, and a scratchy feeling in one eye or both eyes.  Your pressure patch keeps loosening up, and you can blink your eye under the patch after treatment.  Any kind of discharge develops from the eye after treatment or if the lids stick together in the morning.  You have the same symptoms in the morning as you did with the original abrasion days, weeks, or months after the abrasion healed. MAKE SURE YOU:   Understand these instructions.  Will watch your condition.  Will get help right away if you are not doing well or get worse. Document Released: 01/01/2000 Document Revised: 01/08/2013 Document Reviewed: 09/10/2012 [employer]  Patient Information ©2015 [employer]. This information is not intended to replace advice given to you by your health care provider. Make sure you discuss any questions you have with your health care provider. ° °

## 2014-09-17 NOTE — ED Notes (Signed)
Pt reports he had a bug fly into his eye yesterday, and then today, he was hit in the same eye with a tree branch.  The eye is red with discharge.

## 2016-01-16 ENCOUNTER — Encounter (HOSPITAL_COMMUNITY): Payer: Self-pay | Admitting: Certified Nurse Midwife

## 2016-01-16 ENCOUNTER — Emergency Department (HOSPITAL_COMMUNITY)
Admission: EM | Admit: 2016-01-16 | Discharge: 2016-01-16 | Disposition: A | Discharge status: Home / Self Care | Payer: PRIVATE HEALTH INSURANCE | Attending: Emergency Medicine | Admitting: Emergency Medicine

## 2016-01-16 DIAGNOSIS — Z87891 Personal history of nicotine dependence: Secondary | ICD-10-CM | POA: Diagnosis not present

## 2016-01-16 DIAGNOSIS — M545 Low back pain, unspecified: Secondary | ICD-10-CM

## 2016-01-16 DIAGNOSIS — M549 Dorsalgia, unspecified: Secondary | ICD-10-CM | POA: Diagnosis present

## 2016-01-16 MED ORDER — CYCLOBENZAPRINE HCL 10 MG PO TABS: 10.0000 mg | ORAL_TABLET | Freq: Two times a day (BID) | ORAL | 0 refills | Status: DC | PRN

## 2016-01-16 MED ORDER — DEXAMETHASONE SODIUM PHOSPHATE 10 MG/ML IJ SOLN
10.0000 mg | Freq: Once | INTRAMUSCULAR | Status: AC
  Administered 2016-01-16: 10 mg via INTRAVENOUS
  Filled 2016-01-16: qty 1

## 2016-01-16 MED ORDER — HYDROCODONE-ACETAMINOPHEN 5-325 MG PO TABS: 2.0000 | ORAL_TABLET | ORAL | 0 refills | Status: DC | PRN

## 2016-01-16 MED ORDER — LORAZEPAM 2 MG/ML IJ SOLN
1.0000 mg | Freq: Once | INTRAMUSCULAR | Status: AC
  Administered 2016-01-16: 1 mg via INTRAVENOUS
  Filled 2016-01-16: qty 1

## 2016-01-16 MED ORDER — MORPHINE SULFATE (PF) 4 MG/ML IV SOLN
4.0000 mg | Freq: Once | INTRAVENOUS | Status: AC
  Administered 2016-01-16: 4 mg via INTRAVENOUS
  Filled 2016-01-16: qty 1

## 2016-01-16 NOTE — ED Triage Notes (Signed)
Pt states he has had back pain for 2 weeks but it has increased significantly in the last 2 days. Pt states he is unable to move and needs assistance with ADLs. Pt states pain is "25/10".

## 2016-01-16 NOTE — ED Provider Notes (Signed)
[facility]-EMERGENCY DEPT Provider Note   CSN: [id number] Arrival date & time: 01/16/16  1111  By signing my name below, I, Clovis PuAvnee Patel, attest that this documentation has been prepared under the direction and in the presence of  Cheri FowlerKayla Zhi Geier, PA-C. Electronically Signed: Clovis PuAvnee Patel, ED Scribe. 01/16/16. 12:56 PM.   History   Chief Complaint Chief Complaint  Patient presents with  . Back Pain   The history is provided by the patient. No language interpreter was used.   HPI Comments:  Terry Junglingheodore Sease is a 55 y.o. male who presents to the Emergency Department complaining of sudden onset, worsening, sharp, diffuse back pain x 2 weeks. Relative states she had to dress the pt in the AM today due to his pain. His pain is worse with breathing, coughing, sneezing, upon palpation, when lifting his legs and other movements such as sitting up.  No alleviating factors noted. No medications pta.  Pt denies any heavy lifiting, bowel/bladder incontinence, abdominal pain, dysuria, hematuria, fevers, chills, weight loss, hx of IV drug use, any other associated symptoms and any other modifying factors at this time.   Allergies include NSAIDs, anaphylaxis.   Past Medical History:  Diagnosis Date  . Phobia     There are no active problems to display for this patient.   Past Surgical History:  Procedure Laterality Date  . ABDOMINAL SURGERY    . I&D EXTREMITY Right 11/07/2013   Procedure: IRRIGATION AND DEBRIDEMENT EXTREMITY;  Surgeon: Betha LoaKevin Kuzma, MD;  Location: [facility] OR;  Service: Orthopedics;  Laterality: Right;  . KNEE SURGERY      Home Medications    Prior to Admission medications   Medication Sig Start Date End Date Taking? Authorizing Provider  cyclobenzaprine (FLEXERIL) 10 MG tablet Take 1 tablet (10 mg total) by mouth 2 (two) times daily as needed for muscle spasms. 01/16/16   Cheri FowlerKayla Iness Pangilinan, PA-C  HYDROcodone-acetaminophen (NORCO/VICODIN) 5-325 MG tablet Take 2 tablets by mouth every 4 (four) hours  as needed. 01/16/16   Cheri FowlerKayla Tobin Cadiente, PA-C  sulfamethoxazole-trimethoprim (SEPTRA DS) 800-160 MG per tablet Take 1 tablet by mouth 2 (two) times daily. 11/06/13   Arby BarretteMarcy Pfeiffer, MD  trimethoprim-polymyxin b (POLYTRIM) ophthalmic solution Place 2 drops into the left eye every 6 (six) hours. Treat for 5-7 days 09/17/14   Ozella Rocksavid J Merrell, MD    Family History Family History  Problem Relation Age of Onset  . Diabetes Other   . Hypertension Other     Social History Social History  Substance Use Topics  . Smoking status: Former Smoker    Packs/day: 1.00    Types: Cigarettes  . Smokeless tobacco: Never Used  . Alcohol use Yes     Comment: socially     Allergies   Naproxen   Review of Systems Review of Systems 10 systems reviewed and all are negative for acute change except as noted in the HPI.  Physical Exam Updated Vital Signs BP 114/82   Pulse 60   Temp 98.7 F (37.1 C) (Oral)   Resp 16   Ht 5\' 9"  (1.753 m)   Wt 95.3 kg   SpO2 93%   BMI 31.01 kg/m   Physical Exam  Constitutional: He is oriented to person, place, and time. He appears well-developed and well-nourished.  Pt obviously uncomfortable, sitting in chair awkwardly, exquisite pain with sitting up.  HENT:  Head: Atraumatic.  Eyes: Conjunctivae are normal.  Cardiovascular: Normal rate, regular rhythm, normal heart sounds and intact distal pulses.   Pulses:  Dorsalis pedis pulses are 2+ on the right side, and 2+ on the left side.  Pulmonary/Chest: Effort normal and breath sounds normal.  Abdominal: Soft. Bowel sounds are normal. He exhibits no distension. There is no tenderness.  Musculoskeletal: He exhibits tenderness.  No spinous process tenderness.  No step offs. No crepitus. Mild tenderness of lumbar spine.   Neurological: He is alert and oriented to person, place, and time.  No saddle anesthesia. Strength and sensation intact bilaterally throughout lower extremities. Exquisite pain with strength testing  of lower extremities.   Skin: Skin is warm and dry.  Psychiatric: He has a normal mood and affect. His behavior is normal.    ED Treatments / Results  DIAGNOSTIC STUDIES:  Oxygen Saturation is 96% on RA, normal by my interpretation.    COORDINATION OF CARE:  12:48 PM Discussed treatment plan with pt at bedside and pt agreed to plan.  Labs (all labs ordered are listed, but only abnormal results are displayed) Labs Reviewed - No data to display  EKG  EKG Interpretation None       Radiology No results found.  Procedures Procedures (including critical care time)  Medications Ordered in ED Medications  dexamethasone (DECADRON) injection 10 mg (10 mg Intravenous Given 01/16/16 1305)  morphine 4 MG/ML injection 4 mg (4 mg Intravenous Given 01/16/16 1305)  LORazepam (ATIVAN) injection 1 mg (1 mg Intravenous Given 01/16/16 1305)     Initial Impression / Assessment and Plan / ED Course  I have reviewed the triage vital signs and the nursing notes.  Pertinent labs & imaging results that were available during my care of the patient were reviewed by me and considered in my medical decision making (see chart for details).  Clinical Course    Patient with back pain.  No neurological deficits and normal neuro exam.  Patient is ambulatory.  Pain improved after treatment.  No loss of bowel or bladder control.  No concern for cauda equina.  No fever, night sweats, weight loss, h/o cancer, IVDA, no recent procedure to back. No urinary symptoms suggestive of UTI.  No indication for emergent imaging, but discussed likely need for outpatient MRI. Supportive care and return precaution discussed. Appears safe for discharge at this time. Follow up as indicated in discharge paperwork.   Final Clinical Impressions(s) / ED Diagnoses   Final diagnoses:  Acute midline low back pain without sciatica    New Prescriptions Discharge Medication List as of 01/16/2016  1:56 PM    START taking  these medications   Details  cyclobenzaprine (FLEXERIL) 10 MG tablet Take 1 tablet (10 mg total) by mouth 2 (two) times daily as needed for muscle spasms., Starting Sat 01/16/2016, Print    HYDROcodone-acetaminophen (NORCO/VICODIN) 5-325 MG tablet Take 2 tablets by mouth every 4 (four) hours as needed., Starting Sat 01/16/2016, Print       I personally performed the services described in this documentation, which was scribed in my presence. The recorded information has been reviewed and is accurate.     Cheri FowlerKayla Caretha Rumbaugh, PA-C 01/16/16 1425    Cathren LaineKevin Steinl, MD 01/17/16 [phone]

## 2016-01-16 NOTE — Discharge Instructions (Signed)
Follow up with the neurosurgeon and [facility] for your back pain.  They can schedule an outpatient MRI in the next couple of weeks.  Return to the ED for sudden worsening pain or any new symptoms.

## 2016-02-26 ENCOUNTER — Other Ambulatory Visit: Payer: Self-pay | Admitting: Neurosurgery

## 2016-02-26 DIAGNOSIS — M5412 Radiculopathy, cervical region: Secondary | ICD-10-CM

## 2016-02-26 DIAGNOSIS — Z77018 Contact with and (suspected) exposure to other hazardous metals: Secondary | ICD-10-CM

## 2016-02-26 DIAGNOSIS — M48062 Spinal stenosis, lumbar region with neurogenic claudication: Secondary | ICD-10-CM

## 2016-03-14 ENCOUNTER — Ambulatory Visit
Admission: RE | Admit: 2016-03-14 | Discharge: 2016-03-14 | Disposition: A | Discharge status: Home / Self Care | Payer: PRIVATE HEALTH INSURANCE | Source: Ambulatory Visit | Attending: Neurosurgery | Admitting: Neurosurgery

## 2016-03-14 DIAGNOSIS — Z77018 Contact with and (suspected) exposure to other hazardous metals: Secondary | ICD-10-CM

## 2016-03-14 DIAGNOSIS — M48062 Spinal stenosis, lumbar region with neurogenic claudication: Secondary | ICD-10-CM

## 2016-03-14 DIAGNOSIS — M5412 Radiculopathy, cervical region: Secondary | ICD-10-CM

## 2016-03-15 ENCOUNTER — Other Ambulatory Visit (HOSPITAL_COMMUNITY): Payer: Self-pay | Admitting: Neurosurgery

## 2016-03-15 DIAGNOSIS — M5412 Radiculopathy, cervical region: Secondary | ICD-10-CM

## 2016-03-15 DIAGNOSIS — M48062 Spinal stenosis, lumbar region with neurogenic claudication: Secondary | ICD-10-CM

## 2016-03-27 ENCOUNTER — Emergency Department (HOSPITAL_COMMUNITY)
Admission: EM | Admit: 2016-03-27 | Discharge: 2016-03-27 | Disposition: A | Discharge status: Home / Self Care | Payer: PRIVATE HEALTH INSURANCE | Attending: Emergency Medicine | Admitting: Emergency Medicine

## 2016-03-27 ENCOUNTER — Encounter (HOSPITAL_COMMUNITY): Payer: Self-pay

## 2016-03-27 DIAGNOSIS — Y9389 Activity, other specified: Secondary | ICD-10-CM | POA: Diagnosis not present

## 2016-03-27 DIAGNOSIS — Z87891 Personal history of nicotine dependence: Secondary | ICD-10-CM | POA: Insufficient documentation

## 2016-03-27 DIAGNOSIS — H5712 Ocular pain, left eye: Secondary | ICD-10-CM | POA: Diagnosis present

## 2016-03-27 DIAGNOSIS — T1502XA Foreign body in cornea, left eye, initial encounter: Secondary | ICD-10-CM | POA: Insufficient documentation

## 2016-03-27 DIAGNOSIS — T1592XA Foreign body on external eye, part unspecified, left eye, initial encounter: Secondary | ICD-10-CM

## 2016-03-27 DIAGNOSIS — Y990 Civilian activity done for income or pay: Secondary | ICD-10-CM | POA: Diagnosis not present

## 2016-03-27 DIAGNOSIS — X58XXXA Exposure to other specified factors, initial encounter: Secondary | ICD-10-CM | POA: Diagnosis not present

## 2016-03-27 DIAGNOSIS — Y9289 Other specified places as the place of occurrence of the external cause: Secondary | ICD-10-CM | POA: Insufficient documentation

## 2016-03-27 MED ORDER — HYDROCODONE-ACETAMINOPHEN 5-325 MG PO TABS: 2.0000 | ORAL_TABLET | ORAL | 0 refills | Status: DC | PRN

## 2016-03-27 MED ORDER — FLUORESCEIN SODIUM 0.6 MG OP STRP
1.0000 | ORAL_STRIP | Freq: Once | OPHTHALMIC | Status: AC
  Administered 2016-03-27: 1 via OPHTHALMIC
  Filled 2016-03-27: qty 1

## 2016-03-27 MED ORDER — TETRACAINE HCL 0.5 % OP SOLN
2.0000 [drp] | Freq: Once | OPHTHALMIC | Status: AC
  Administered 2016-03-27: 2 [drp] via OPHTHALMIC
  Filled 2016-03-27: qty 2

## 2016-03-27 MED ORDER — ERYTHROMYCIN 5 MG/GM OP OINT: TOPICAL_OINTMENT | OPHTHALMIC | 0 refills | Status: DC

## 2016-03-27 MED ORDER — HYDROCODONE-ACETAMINOPHEN 5-325 MG PO TABS
1.0000 | ORAL_TABLET | Freq: Once | ORAL | Status: AC
  Administered 2016-03-27: 1 via ORAL
  Filled 2016-03-27: qty 1

## 2016-03-27 NOTE — ED Triage Notes (Signed)
Patient complains of left eye redness and pain with light x 2 days after possible metal shavings on Friday. No visual changes

## 2016-03-27 NOTE — Discharge Instructions (Signed)
Please use the eye ointment. Have given you the referral to the ophthalmologist. Please be there at 8:30 in the morning. Take pain medicine as needed. May take Motrin.

## 2016-03-27 NOTE — ED Notes (Signed)
Declined W/C at D/C and was escorted to lobby by RN. 

## 2016-03-27 NOTE — ED Triage Notes (Signed)
Visual acuity preformed by PA.

## 2016-03-27 NOTE — ED Provider Notes (Signed)
[facility]-EMERGENCY DEPT Provider Note   CSN: [id number] Arrival date & time: 03/27/16  0907   By signing my name below, I, Terry Mckinney, attest that this documentation has been prepared under the direction and in the presence of Terry LollKenneth Crimson Dubberly, PA-C. Electronically Signed: Freida Busmaniana Mckinney, Scribe. 03/27/2016. 10:14 AM.   History   Chief Complaint Chief Complaint  Patient presents with  . Eye Pain    The history is provided by the patient. No language interpreter was used.    HPI Comments:  Terry Mckinney is a 56 y.o. male who presents to the Emergency Department complaining of constant left eye pain x 3 days. 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 He states he washed the eye out but still has a foreign body sensation in the eye. His pain is exacerbated by light.  He reports associated blurry vision of the left eye and redness. He denies use of corrective/contact lenses.  No alleviating factors noted.    Past Medical History:  Diagnosis Date  . Phobia     There are no active problems to display for this patient.   Past Surgical History:  Procedure Laterality Date  . ABDOMINAL SURGERY    . I&D EXTREMITY Right 11/07/2013   Procedure: IRRIGATION AND DEBRIDEMENT EXTREMITY;  Surgeon: Betha LoaKevin Kuzma, MD;  Location: [facility] OR;  Service: Orthopedics;  Laterality: Right;  . KNEE SURGERY         Home Medications    Prior to Admission medications   Medication Sig Start Date End Date Taking? Authorizing Provider  cyclobenzaprine (FLEXERIL) 10 MG tablet Take 1 tablet (10 mg total) by mouth 2 (two) times daily as needed for muscle spasms. 01/16/16   Cheri FowlerKayla Rose, PA-C  HYDROcodone-acetaminophen (NORCO/VICODIN) 5-325 MG tablet Take 2 tablets by mouth every 4 (four) hours as needed. 01/16/16   Cheri FowlerKayla Rose, PA-C  sulfamethoxazole-trimethoprim (SEPTRA DS) 800-160 MG per tablet Take 1 tablet by mouth 2 (two) times daily. 11/06/13   Arby BarretteMarcy Pfeiffer, MD  trimethoprim-polymyxin b  (POLYTRIM) ophthalmic solution Place 2 drops into the left eye every 6 (six) hours. Treat for 5-7 days 09/17/14   Ozella Rocksavid J Merrell, MD    Family History Family History  Problem Relation Age of Onset  . Diabetes Other   . Hypertension Other     Social History Social History  Substance Use Topics  . Smoking status: Former Smoker    Packs/day: 1.00    Types: Cigarettes  . Smokeless tobacco: Never Used  . Alcohol use Yes     Comment: socially     Allergies   Naproxen   Review of Systems Review of Systems  Constitutional: Negative for chills and fever.  Eyes: Positive for photophobia, pain, redness and visual disturbance.  Respiratory: Negative for shortness of breath.   Cardiovascular: Negative for chest pain.     Physical Exam Updated Vital Signs BP 117/98 (BP Location: Right Arm)   Pulse 68   Temp 98.7 F (37.1 C) (Oral)   Resp 20   SpO2 98%   Physical Exam  Constitutional: He is oriented to person, place, and time. He appears well-developed and well-nourished. No distress.  Very uncomfortable due to pain.  HENT:  Head: Normocephalic and atraumatic.  Eyes: EOM and lids are normal. Pupils are equal, round, and reactive to light. Lids are everted and swept, no foreign bodies found. Right eye exhibits no discharge. Left eye exhibits no discharge. Right conjunctiva is not injected. Right conjunctiva has no hemorrhage.  Left conjunctiva is injected. Left conjunctiva has no hemorrhage.  Slit lamp exam:      The right eye shows no corneal abrasion, no corneal flare, no corneal ulcer, no foreign body, no hyphema and no fluorescein uptake.       The left eye shows corneal abrasion, foreign body and fluorescein uptake. The left eye shows no corneal flare, no corneal ulcer and no hyphema.  Visual acuity  20/32 right 20/63 left  IOP:  Left 14; Right 16 metallic foreign body in the left eye over the cornea. Rust ring noted.  Cardiovascular: Normal rate.   Pulmonary/Chest:  Effort normal.  Abdominal: He exhibits no distension.  Neurological: He is alert and oriented to person, place, and time.  Skin: Skin is warm and dry.  Psychiatric: He has a normal mood and affect.  Nursing note and vitals reviewed.    ED Treatments / Results  DIAGNOSTIC STUDIES:  Oxygen Saturation is 98% on RA, normal by my interpretation.    COORDINATION OF CARE:  10:06 AM Discussed treatment plan with pt at bedside and pt agreed to plan.  Labs (all labs ordered are listed, but only abnormal results are displayed) Labs Reviewed - No data to display  EKG  EKG Interpretation None       Radiology No results found.  Procedures Procedures (including critical care time)  Medications Ordered in ED Medications  tetracaine (PONTOCAINE) 0.5 % ophthalmic solution 2 drop (2 drops Left Eye Given 03/27/16 0937)  fluorescein ophthalmic strip 1 strip (1 strip Left Eye Given 03/27/16 0937)  HYDROcodone-acetaminophen (NORCO/VICODIN) 5-325 MG per tablet 1 tablet (1 tablet Oral Given 03/27/16 0936)     Initial Impression / Assessment and Plan / ED Course  I have reviewed the triage vital signs and the nursing notes.  Pertinent labs & imaging results that were available during my care of the patient were reviewed by me and considered in my medical decision making (see chart for details).      10:59 AM Discussed case with PA Flemming with (opthalmologist). Will see in the office at 8AM in the morning.    Pt with corneal abrasion on exam with evidence of a foreign body. IOP normal. Decreased visual acuity in the left eye.  Foreign body removed at bedside by Dr. Freida Busman with slit lamp. Rust ring noted. Corneal uptake from abrasion. Exam not concerning for orbital cellulitis, hyphema. No concern for uveitis. Patient will be discharged home with erythromycin.   Patient understands to follow up with ophthalmology; return precautions discussed. Patient appears safe for discharged. Pain  improved after the foreign body was removed.   Final Clinical Impressions(s) / ED Diagnoses   Final diagnoses:  Foreign body of left eye, initial encounter    New Prescriptions Discharge Medication List as of 03/27/2016 11:38 AM    START taking these medications   Details  erythromycin ophthalmic ointment Place a 1/2 inch ribbon of ointment into the lower eyelid., Print       I personally performed the services described in this documentation, which was scribed in my presence. The recorded information has been reviewed and is accurate.     Rise Mu, PA-C 03/27/16 2055    Lorre Nick, MD 03/30/16 [phone]

## 2016-04-05 ENCOUNTER — Inpatient Hospital Stay (HOSPITAL_COMMUNITY)
Admission: RE | Admit: 2016-04-05 | Discharge: 2016-04-05 | Disposition: A | Discharge status: Home / Self Care | Payer: PRIVATE HEALTH INSURANCE | Source: Ambulatory Visit

## 2016-04-05 NOTE — Progress Notes (Signed)
Attempted to call pt about pre-op appointment today, unable to get in contact with patient.

## 2016-04-06 ENCOUNTER — Other Ambulatory Visit (HOSPITAL_COMMUNITY): Payer: Self-pay | Admitting: Neurosurgery

## 2016-04-06 DIAGNOSIS — S0540XA Penetrating wound of orbit with or without foreign body, unspecified eye, initial encounter: Secondary | ICD-10-CM

## 2016-04-07 ENCOUNTER — Encounter (HOSPITAL_COMMUNITY): Payer: Self-pay

## 2016-04-07 ENCOUNTER — Ambulatory Visit (HOSPITAL_COMMUNITY): Admission: RE | Admit: 2016-04-07 | Payer: PRIVATE HEALTH INSURANCE | Source: Ambulatory Visit | Admitting: Neurosurgery

## 2016-04-07 ENCOUNTER — Ambulatory Visit (HOSPITAL_COMMUNITY): Payer: PRIVATE HEALTH INSURANCE

## 2016-04-07 ENCOUNTER — Encounter (HOSPITAL_COMMUNITY): Admission: RE | Payer: Self-pay | Source: Ambulatory Visit

## 2016-04-07 SURGERY — RADIOLOGY WITH ANESTHESIA
Anesthesia: General

## 2016-04-26 NOTE — Addendum Note (Signed)
Encounter addended by: Konor Noren A Charne Mcbrien, RN on: 04/26/2016  3:16 PM<BR>    Actions taken: Delete clinical note

## 2018-04-23 ENCOUNTER — Emergency Department (HOSPITAL_COMMUNITY): Payer: Self-pay

## 2018-04-23 ENCOUNTER — Other Ambulatory Visit: Payer: Self-pay

## 2018-04-23 ENCOUNTER — Encounter (HOSPITAL_COMMUNITY): Payer: Self-pay

## 2018-04-23 ENCOUNTER — Emergency Department (HOSPITAL_COMMUNITY)
Admission: EM | Admit: 2018-04-23 | Discharge: 2018-04-23 | Disposition: A | Discharge status: Home / Self Care | Payer: Self-pay | Attending: Emergency Medicine | Admitting: Emergency Medicine

## 2018-04-23 DIAGNOSIS — Z87891 Personal history of nicotine dependence: Secondary | ICD-10-CM | POA: Insufficient documentation

## 2018-04-23 DIAGNOSIS — R0602 Shortness of breath: Secondary | ICD-10-CM | POA: Insufficient documentation

## 2018-04-23 LAB — CBC WITH DIFFERENTIAL/PLATELET
Abs Immature Granulocytes: 0.01 10*3/uL (ref 0.00–0.07)
Basophils Absolute: 0.1 10*3/uL (ref 0.0–0.1)
Basophils Relative: 1 %
Eosinophils Absolute: 0.1 10*3/uL (ref 0.0–0.5)
Eosinophils Relative: 2 %
HCT: 43.9 % (ref 39.0–52.0)
Hemoglobin: 15.6 g/dL (ref 13.0–17.0)
Immature Granulocytes: 0 %
Lymphocytes Relative: 35 %
Lymphs Abs: 2.4 10*3/uL (ref 0.7–4.0)
MCH: 29.1 pg (ref 26.0–34.0)
MCHC: 35.5 g/dL (ref 30.0–36.0)
MCV: 81.9 fL (ref 80.0–100.0)
Monocytes Absolute: 0.5 10*3/uL (ref 0.1–1.0)
Monocytes Relative: 8 %
Neutro Abs: 3.6 10*3/uL (ref 1.7–7.7)
Neutrophils Relative %: 54 %
Platelets: 269 10*3/uL (ref 150–400)
RBC: 5.36 MIL/uL (ref 4.22–5.81)
RDW: 12.9 % (ref 11.5–15.5)
WBC: 6.6 10*3/uL (ref 4.0–10.5)
nRBC: 0 % (ref 0.0–0.2)

## 2018-04-23 LAB — BASIC METABOLIC PANEL
Anion gap: 9 (ref 5–15)
BUN: 14 mg/dL (ref 6–20)
CO2: 25 mmol/L (ref 22–32)
Calcium: 9.2 mg/dL (ref 8.9–10.3)
Chloride: 102 mmol/L (ref 98–111)
Creatinine, Ser: 1 mg/dL (ref 0.61–1.24)
GFR calc Af Amer: >60 mL/min (ref >60)
GFR calc non Af Amer: >60 mL/min (ref >60)
Glucose, Bld: 120 mg/dL — ABNORMAL HIGH (ref 70–99)
Potassium: 3.8 mmol/L (ref 3.5–5.1)
Sodium: 136 mmol/L (ref 135–145)

## 2018-04-23 LAB — TROPONIN I: Troponin I: <0.03 ng/mL (ref <0.03)

## 2018-04-23 IMAGING — CR CHEST - 2 VIEW
2 series · 2 of 2 positions shown · non-contrast
Comparison: [DATE]

CLINICAL DATA: Shortness of breath

EXAM:
CHEST - 2 VIEW

[w chest pa]
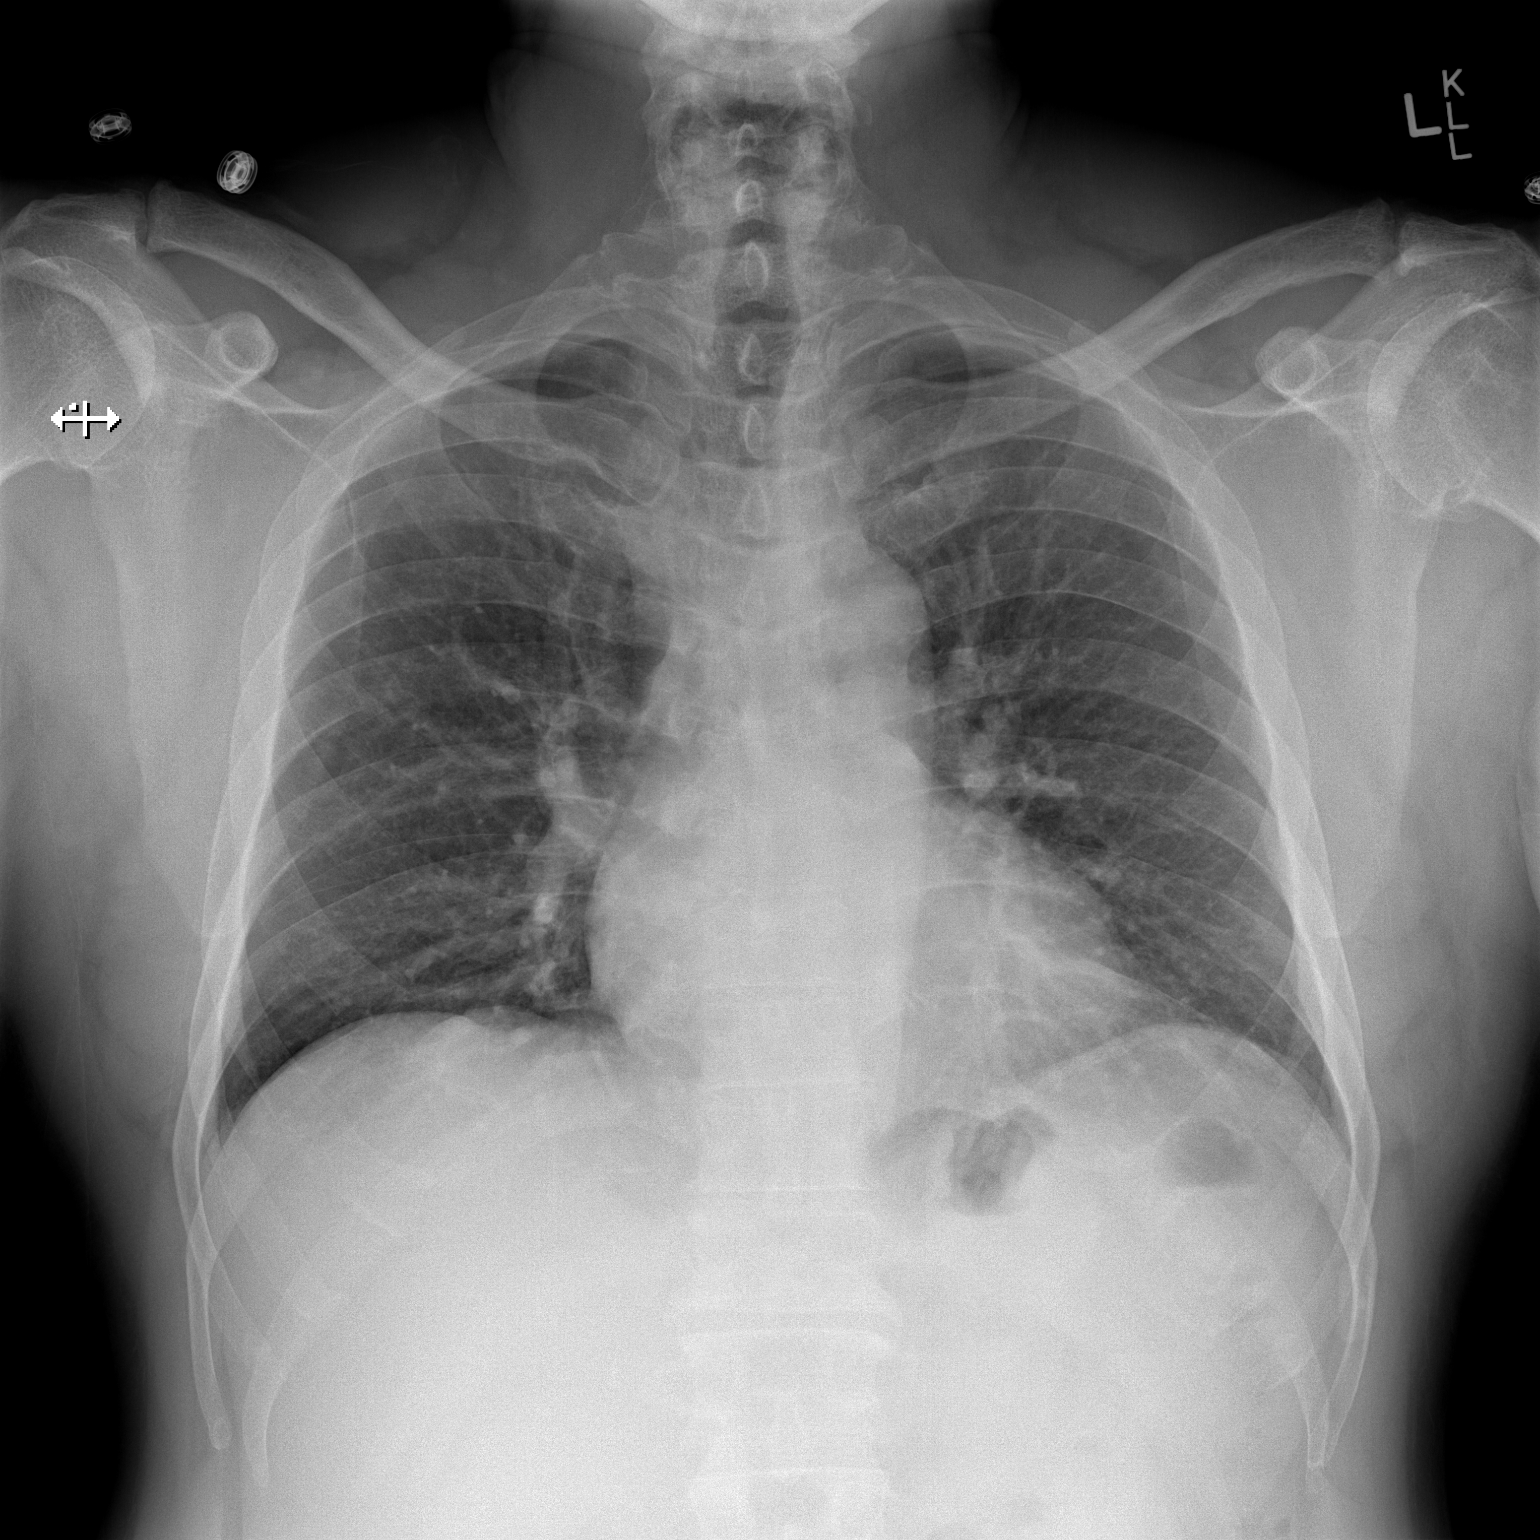

[w chest lat]
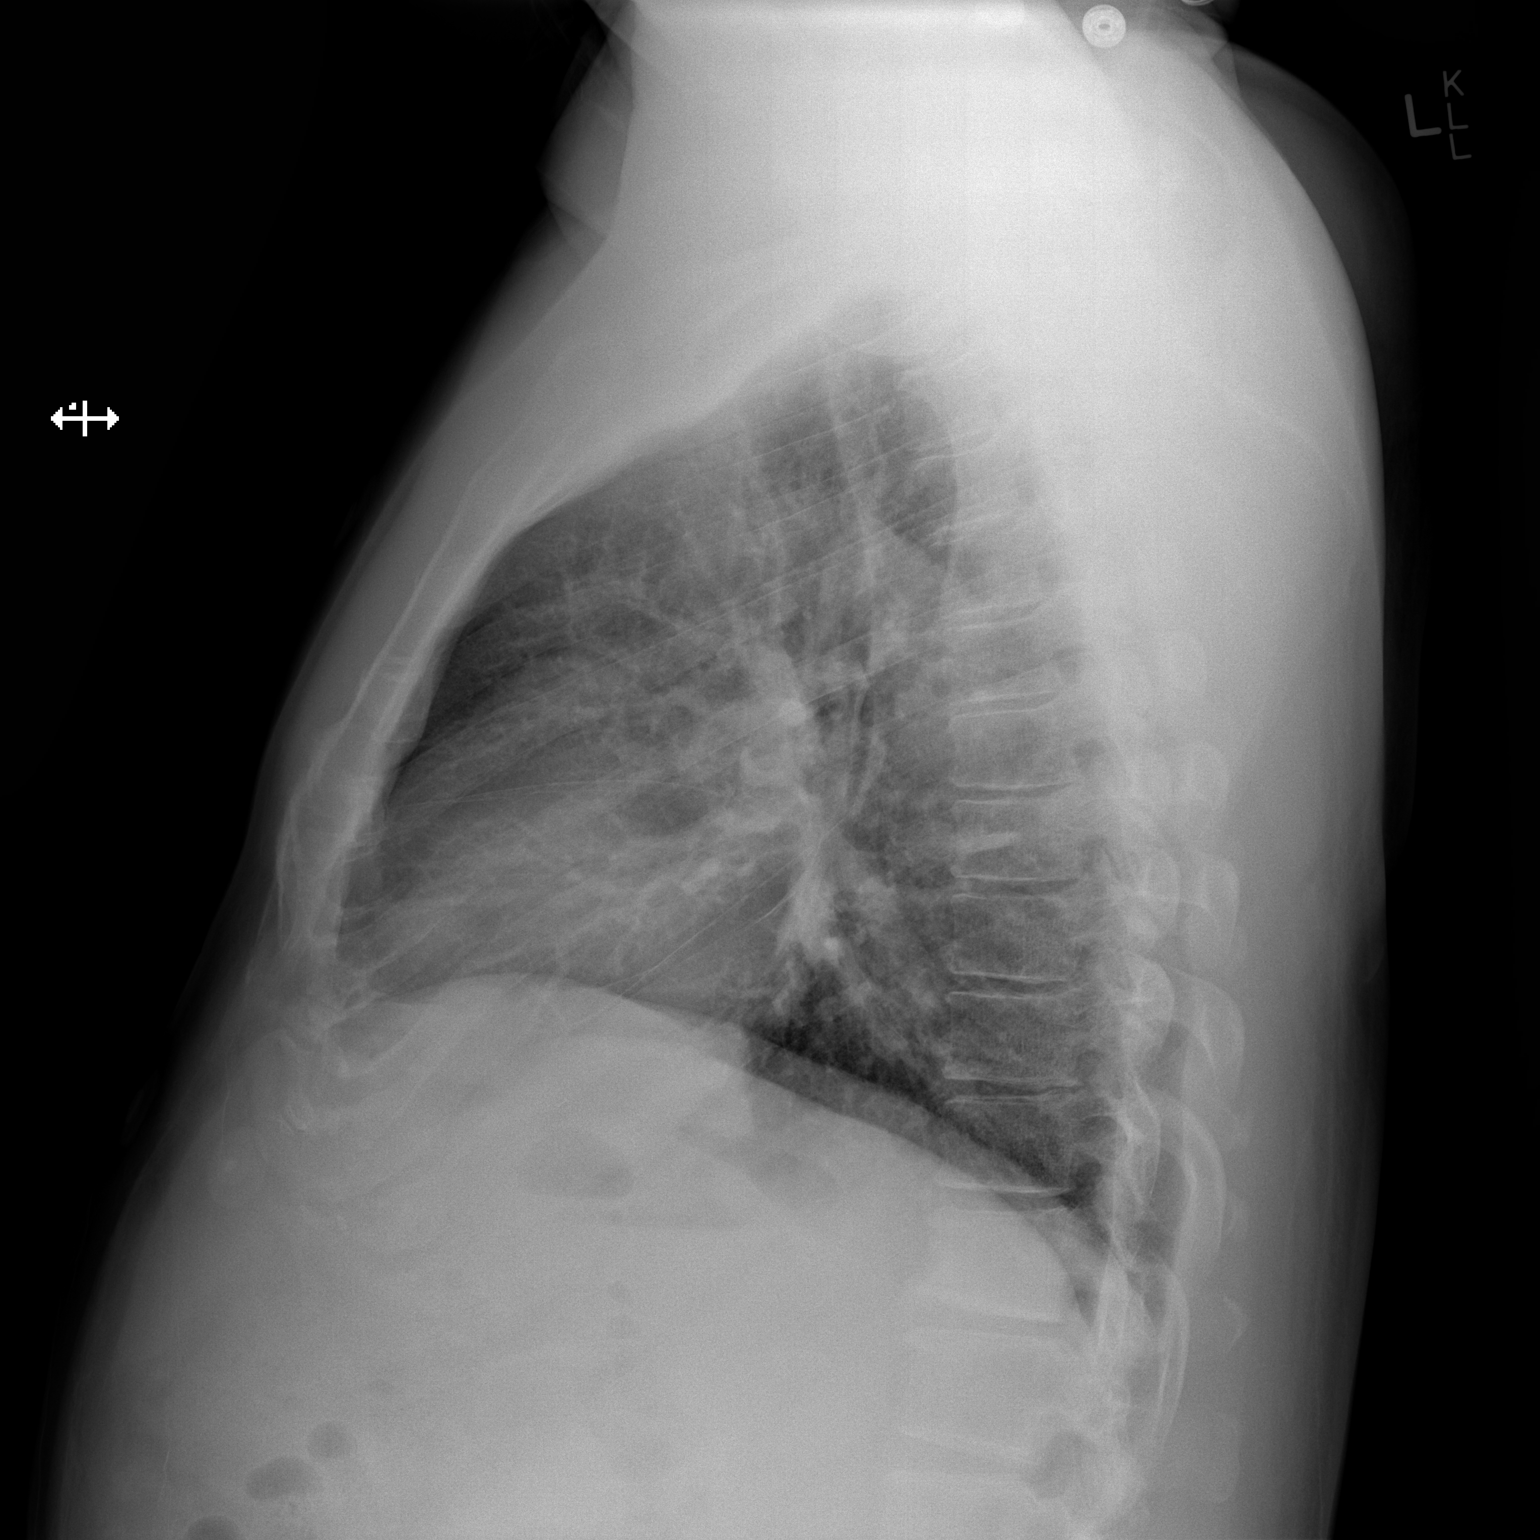

[2 of 2 positions shown; findings below may reference images not displayed]

FINDINGS: There is no appreciable edema or consolidation. Heart size and
pulmonary vascularity are normal. No adenopathy. No bone lesions.
IMPRESSION: No edema or consolidation.

## 2018-04-23 NOTE — ED Provider Notes (Signed)
[facility]-EMERGENCY DEPT Provider Note   CSN: [id number] Arrival date & time: 04/23/18  1918    History   Chief Complaint Chief Complaint  Patient presents with  . Shortness of Breath    HPI Terry Mckinney is a 58 y.o. male with a PMH of claustrophobia and panic attacks presenting with intermittent shortness of breath onset today about 1 hour ago. Patient reports he has had similar episodes over the last 10 years. Patient states episodes are associated with stress and anxiety. Patient states using a cold wash cloth and resting makes symptoms better. Patient states he was at work when the episode started today. Patient denies any symptoms currently. Patient denies fever, cough, body aches, chills, sweats, congestion, rhinorrhea, nausea, vomiting, abdominal pain, or diarrhea. Patient denies sick contacts or recent travel. Patient denies palpitations or chest pain. Patient denies recent surgery, leg edema/pain, or hx of DVT/PE. Patient denies any cardiac problems.     HPI  Past Medical History:  Diagnosis Date  . Phobia     There are no active problems to display for this patient.   Past Surgical History:  Procedure Laterality Date  . ABDOMINAL SURGERY    . I&D EXTREMITY Right 11/07/2013   Procedure: IRRIGATION AND DEBRIDEMENT EXTREMITY;  Surgeon: Betha Loa, MD;  Location: [facility] OR;  Service: Orthopedics;  Laterality: Right;  . KNEE SURGERY          Home Medications    Prior to Admission medications   Medication Sig Start Date End Date Taking? Authorizing Provider  ibuprofen (ADVIL,MOTRIN) 200 MG tablet Take 200 mg by mouth daily as needed for mild pain.   Yes [provider]  Liniments (BEN GAY EX) Apply 1 application topically daily as needed (muscle aches).   Yes [provider]  erythromycin ophthalmic ointment Place a 1/2 inch ribbon of ointment into the lower eyelid. Patient not taking: Reported on 04/23/2018 03/27/16   Demetrios Loll T, PA-C  HYDROcodone-acetaminophen (NORCO/VICODIN) 5-325 MG tablet Take 2 tablets by mouth every 4 (four) hours as needed. Patient not taking: Reported on 04/23/2018 03/27/16   Rise Mu, PA-C    Family History Family History  Problem Relation Age of Onset  . Diabetes Other   . Hypertension Other     Social History Social History   Tobacco Use  . Smoking status: Former Smoker    Packs/day: 1.00    Types: Cigarettes  . Smokeless tobacco: Never Used  Substance Use Topics  . Alcohol use: Yes    Comment: socially  . Drug use: No     Allergies   Naproxen   Review of Systems Review of Systems  Constitutional: Negative for chills, diaphoresis, fatigue, fever and unexpected weight change.  HENT: Negative for congestion, rhinorrhea, sore throat and trouble swallowing.   Eyes: Negative for visual disturbance.  Respiratory: Positive for shortness of breath. Negative for cough, chest tightness, wheezing and stridor.   Cardiovascular: Negative for chest pain, palpitations and leg swelling.  Gastrointestinal: Negative for abdominal pain, blood in stool, diarrhea, nausea and vomiting.  Endocrine: Negative for cold intolerance and heat intolerance.  Skin: Negative for pallor and rash.  Allergic/Immunologic: Negative for environmental allergies, food allergies and immunocompromised state.  Neurological: Negative for dizziness, syncope, speech difficulty, weakness, light-headedness and numbness.  Psychiatric/Behavioral: The patient is nervous/anxious.     Physical Exam Updated Vital Signs BP (!) 153/106 (BP Location: Left Arm)   Pulse (!) 55   Temp 97.9 F (36.6  C) (Oral)   Resp 16   Ht 5\' 9"  (1.753 m)   Wt 95.3 kg   SpO2 98%   BMI 31.01 kg/m   Physical Exam Vitals signs and nursing note reviewed.  Constitutional:      General: He is not in acute distress.    Appearance: He is well-developed. He is not diaphoretic.     Comments: Patient is sitting up in  bed scrolling through phone in no acute distress.   HENT:     Head: Normocephalic and atraumatic.     Mouth/Throat:     Mouth: Mucous membranes are moist.     Pharynx: No oropharyngeal exudate.  Neck:     Musculoskeletal: Normal range of motion and neck supple.     Vascular: No JVD.  Cardiovascular:     Rate and Rhythm: Regular rhythm. Bradycardia present.     Pulses: Normal pulses.          Radial pulses are 2+ on the right side and 2+ on the left side.       Dorsalis pedis pulses are 2+ on the right side and 2+ on the left side.     Heart sounds: Normal heart sounds. No murmur. No friction rub. No gallop.   Pulmonary:     Effort: Pulmonary effort is normal. No respiratory distress.     Breath sounds: Normal breath sounds. No decreased breath sounds, wheezing, rhonchi or rales.     Comments: Patient is speaking in full sentences in no acute distress on room air.  Chest:     Chest wall: No tenderness.  Abdominal:     Palpations: Abdomen is soft.     Tenderness: There is no abdominal tenderness.  Musculoskeletal: Normal range of motion.     Right lower leg: He exhibits no tenderness. No edema.     Left lower leg: He exhibits no tenderness. No edema.  Skin:    General: Skin is warm.     Capillary Refill: Capillary refill takes less than 2 seconds.     Coloration: Skin is not pale.     Findings: No rash.  Neurological:     Mental Status: He is alert.  Psychiatric:        Mood and Affect: Mood is anxious.     ED Treatments / Results  Labs (all labs ordered are listed, but only abnormal results are displayed) Labs Reviewed  BASIC METABOLIC PANEL - Abnormal; Notable for the following components:      Result Value   Glucose, Bld 120 (*)    All other components within normal limits  CBC WITH DIFFERENTIAL/PLATELET  TROPONIN I    EKG EKG Interpretation  Date/Time:  Monday April 23 2018 19:41:54 EDT Ventricular Rate:  61 PR Interval:    QRS Duration: 95 QT Interval:   393 QTC Calculation: 396 R Axis:   24 Text Interpretation:  Sinus rhythm No significant change since last tracing Confirmed by Richardean Canal [phone]) on 04/23/2018 7:57:06 PM   Radiology Dg Chest 2 View  Result Date: 04/23/2018 CLINICAL DATA:  Shortness of breath EXAM: CHEST - 2 VIEW COMPARISON:  September 27, 2013 FINDINGS: There is no appreciable edema or consolidation. Heart size and pulmonary vascularity are normal. No adenopathy. No bone lesions. IMPRESSION: No edema or consolidation. Electronically Signed   By: Bretta Bang III M.D.   On: 04/23/2018 20:26    Procedures Procedures (including critical care time)  Medications Ordered in ED Medications - No data  to display   Initial Impression / Assessment and Plan / ED Course  I have reviewed the triage vital signs and the nursing notes.  Pertinent labs & imaging results that were available during my care of the patient were reviewed by me and considered in my medical decision making (see chart for details).  Clinical Course as of Apr 23 2139  Mon Apr 23, 2018  2028 CBC is unremarkable.   CBC with Differential [AH]  2030 No edema or consolidation noted on CXR.  DG Chest 2 View [AH]    Clinical Course User Index [AH] Leretha DykesHernandez, Elfa Wooton P, [place]PA-C      Patient presents with shortness of breath. Symptoms improved prior to arrival. Patient is to be discharged with recommendation to follow up with PCP in regards to today's hospital visit. Shortness of breath is not likely of cardiac or pulmonary etiology d/t presentation, PERC negative, VSS, no tracheal deviation, no JVD or new murmur, RRR, breath sounds equal bilaterally, EKG without acute abnormalities, negative troponin, and negative CXR. Pt has been advised to return to the ED if symptoms worsen or becomes concerning in any way. Provided counseling resources to patient. Discussed elevated BP during today's visit. Patient denies any symptoms of hypertensive crisis. Advised patient to  follow up with PCP. Pt appears reliable for follow up and is agreeable to discharge.   Case has been discussed with Dr. Silverio LayYao.  Final Clinical Impressions(s) / ED Diagnoses   Final diagnoses:  SOB (shortness of breath)    ED Discharge Orders    None       Leretha DykesHernandez, Rabecka Brendel P, PA-C 04/23/18 2143    Charlynne PanderYao, David Hsienta, MD 04/23/18 2258

## 2018-04-23 NOTE — Discharge Instructions (Addendum)
You have been seen today for shortness of breath. Please read and follow all provided instructions.  ° °1. Medications: usual home medications °2. Treatment: rest, drink plenty of fluids °3. Follow Up: Please follow up with your primary doctor in 2-5 days for discussion of your diagnoses and further evaluation after today's visit; if you do not have a primary care doctor use the resource guide provided to find one; Please return to the ER for any new or worsening symptoms. Please obtain all of your results from medical records or have your doctors office obtain the results - share them with your doctor - you should be seen at your doctors office. Call today to arrange your follow up.  ° °Take medications as prescribed. Please review all of the medicines and only take them if you do not have an allergy to them. Return to the emergency room for worsening condition or new concerning symptoms. Follow up with your regular doctor. If you don't have a regular doctor use one of the numbers below to establish a primary care doctor. ° °Please be aware that if you are taking birth control pills, taking other prescriptions, ESPECIALLY ANTIBIOTICS may make the birth control ineffective - if this is the case, either do not engage in sexual activity or use alternative methods of birth control such as condoms until you have finished the medicine and your family doctor says it is OK to restart them. If you are on a blood thinner such as COUMADIN, be aware that any other medicine that you take may cause the coumadin to either work too much, or not enough - you should have your coumadin level rechecked in next 7 days if this is the case.  °?  °It is also a possibility that you have an allergic reaction to any of the medicines that you have been prescribed - Everybody reacts differently to medications and while MOST people have no trouble with most medicines, you may have a reaction such as nausea, vomiting, rash, swelling, shortness  of breath. If this is the case, please stop taking the medicine immediately and contact your physician.  °?  °You should return to the ER if you develop severe or worsening symptoms.  ° °Emergency Department Resource Guide °1) Find a Doctor and Pay Out of Pocket °Although you won't have to find out who is covered by your insurance plan, it is a good idea to ask around and get recommendations. You will then need to call the office and see if the doctor you have chosen will accept you as a new patient and what types of options they offer for patients who are self-pay. Some doctors offer discounts or will set up payment plans for their patients who do not have insurance, but you will need to ask so you aren't surprised when you get to your appointment. ° °2) Contact Your Local Health Department °Not all health departments have doctors that can see patients for sick visits, but many do, so it is worth a call to see if yours does. If you don't know where your local health department is, you can check in your phone book. The CDC also has a tool to help you locate your state's health department, and many state websites also have listings of all of their local health departments. ° °3) Find a Walk-in Clinic °If your illness is not likely to be very severe or complicated, you may want to try a walk in clinic. These are popping up all   over the country in pharmacies, drugstores, and shopping centers. They're usually staffed by nurse practitioners or physician assistants that have been trained to treat common illnesses and complaints. They're usually fairly quick and inexpensive. However, if you have serious medical issues or chronic medical problems, these are probably not your best option. ° °No Primary Care Doctor: °Call [facility] at  832-8000 - they can help you locate a primary care doctor that  accepts your insurance, provides certain services, etc. °Physician Referral Service- 1-800-533-3463 ° °Chronic Pain  Problems: °Organization         Address  Phone   Notes  °[facility]  ([phone]) [phone] Patients need to be referred by their primary care doctor.  ° °Medication Assistance: °Organization         Address  Phone   Notes  °[place] [facility] [address] °[place], [place] [address] [phone]) [phone] --Must be a resident of [place] °-- Must have NO insurance coverage whatsoever (no Medicaid/ Medicare, etc.) °-- The pt. MUST have a primary care doctor that directs their care regularly and follows them in the community °  °[facility]  (866) 331-1348   °[employer]  (888) 892-1162   ° °Agencies that provide inexpensive medical care: °Organization         Address  Phone   Notes  °[facility]  ([phone]) [phone]   °[facility]    ([phone]) 832-7272   °[facility] [address] °[place], [place] [address] ([phone]) [phone]   °[facility] [address], °[place] (336) [phone]   °[facility]    (336) 373-0678   °[facility]    (336) 272-1050   °[facility] ° [address], [place] Phone:  (336) [phone], Fax:  ([phone]) [phone] Hours of Operation:  9 am - 6 pm, M-F.  Also accepts Medicaid/Medicare and self-pay.  °[facility] ° [address], [place] Phone: (336) [phone], Fax: ([phone]) [phone]. Hours of Operation:  8:30 am - 5:30 pm, M-F.  Also accepts Medicaid and self-pay.  °[facility] [place] [address], [place] Phone: ([phone]) [phone]   °[facility] [address], [place], [place] (336)[phone], Ext. 123 Mondays & Thursdays: 7-9 AM.  First 15 patients are seen on a first come, first serve basis. °  ° °Medicaid-accepting [place] Providers: ° °Organization         Address  Phone   Notes  °[facility] [address], [place] ([phone]) [phone] Also  accepts self-pay patients.  °[facility] [address], [place] ° ([phone]) [phone]   °[facility] [address], [place] ([phone]) [phone]   °[facility] [address] [place] [address], [place] (336) [phone]   °[facility] [address], [place]  ° ([phone]) [phone] Only accepts [place] Access Medicaid patients after they have their name applied to their card.  ° °Self-Pay (no insurance) in [place]: ° °Organization         Address  Phone   Notes  °Sickle Cell Patients, [facility] [address], [place] ([phone]) [phone]   °[facility] [address], [place] ([phone]) [phone]   °[facility] ° [address]   [address], [place] ([phone]) [phone]   °[facility]/Dr. Osei-Bonsu ° [address] [place] [address], [place] or [address], [place] [phone] Phone number for both [place] and [place] locations is the same.  °[facility] [address], [place] ([phone]) [phone]   °[facility] [address] [place] [address], [place] or [address] ([phone]) [phone] °(336) [phone]   °[facility] [address],  ([phone]) [phone], phone; ([phone]) [phone], fax Sees patients 1st and 3rd Saturday of every month.  Must not qualify for public or private insurance (i.e. Medicaid, Medicare, [place] Health Choice, Veterans' Benefits)  Household income should be no more than 200% of the poverty level The clinic cannot treat you if you are pregnant or think you are pregnant  Sexually transmitted diseases are not treated at the clinic.  °  ° °

## 2018-04-23 NOTE — ED Notes (Addendum)
Pt said he is claustrophobic, has anxiety, and pain attacks for about 10 years. A fan, cool air, or water help calm him.

## 2018-04-23 NOTE — ED Triage Notes (Signed)
Pt complains of becoming short of breath in the last hour, he denies cough, chills or body aches, he also states that he has severe panic attacks and this is making it worse, pt also states that he's claustrophobic

## 2018-04-23 NOTE — ED Notes (Signed)
Patient transported to X-ray 

## 2018-06-28 ENCOUNTER — Emergency Department (HOSPITAL_COMMUNITY): Payer: BC Managed Care – PPO

## 2018-06-28 ENCOUNTER — Other Ambulatory Visit: Payer: Self-pay

## 2018-06-28 ENCOUNTER — Emergency Department (HOSPITAL_COMMUNITY)
Admission: EM | Admit: 2018-06-28 | Discharge: 2018-06-28 | Disposition: A | Discharge status: Home / Self Care | Payer: BC Managed Care – PPO | Attending: Emergency Medicine | Admitting: Emergency Medicine

## 2018-06-28 DIAGNOSIS — R200 Anesthesia of skin: Secondary | ICD-10-CM | POA: Insufficient documentation

## 2018-06-28 DIAGNOSIS — M25552 Pain in left hip: Secondary | ICD-10-CM | POA: Diagnosis present

## 2018-06-28 DIAGNOSIS — Z87891 Personal history of nicotine dependence: Secondary | ICD-10-CM | POA: Insufficient documentation

## 2018-06-28 DIAGNOSIS — M542 Cervicalgia: Secondary | ICD-10-CM | POA: Insufficient documentation

## 2018-06-28 DIAGNOSIS — R202 Paresthesia of skin: Secondary | ICD-10-CM | POA: Diagnosis not present

## 2018-06-28 IMAGING — CR DG HIP (WITH OR WITHOUT PELVIS) 2-3V LEFT
3 series · 3 of 3 positions shown · non-contrast
Comparison: Lumbar spine [DATE]

CLINICAL DATA: Severe left lateral hip pain radiating down left leg
3 days. No injury.

EXAM:
DG HIP (WITH OR WITHOUT PELVIS) 2-3V LEFT

[pelvis ap]
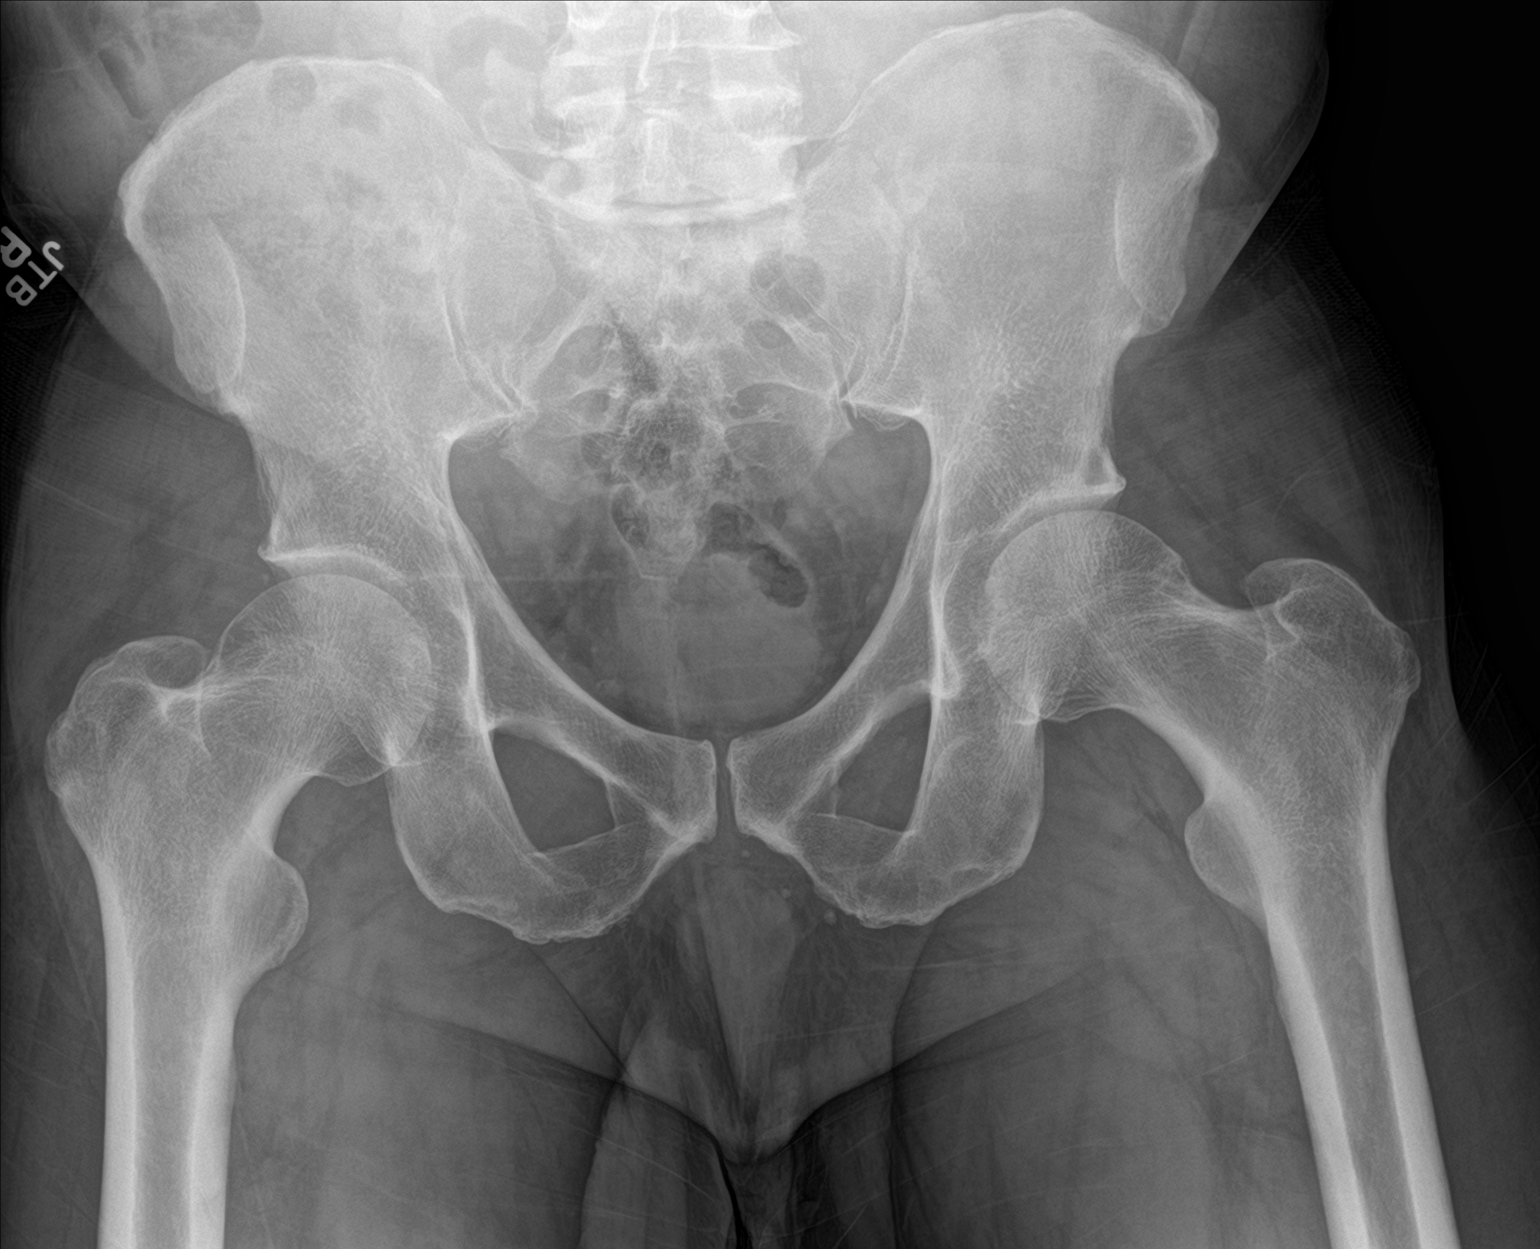

[hip ap]
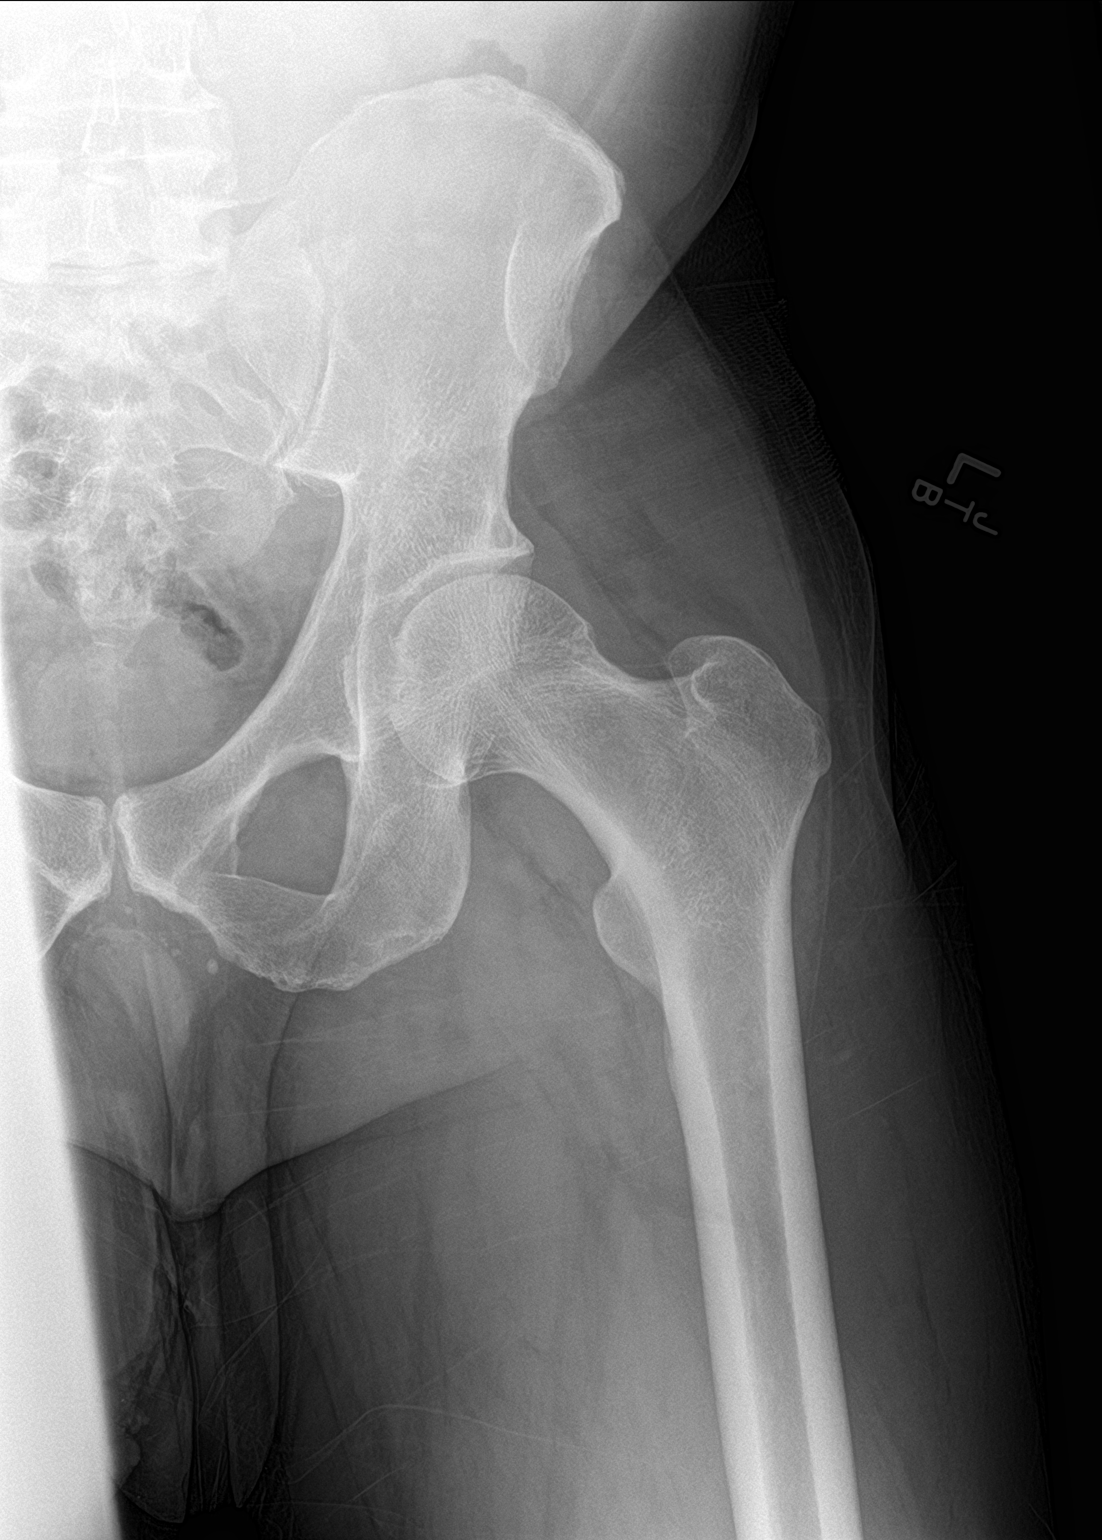

[hip lat]
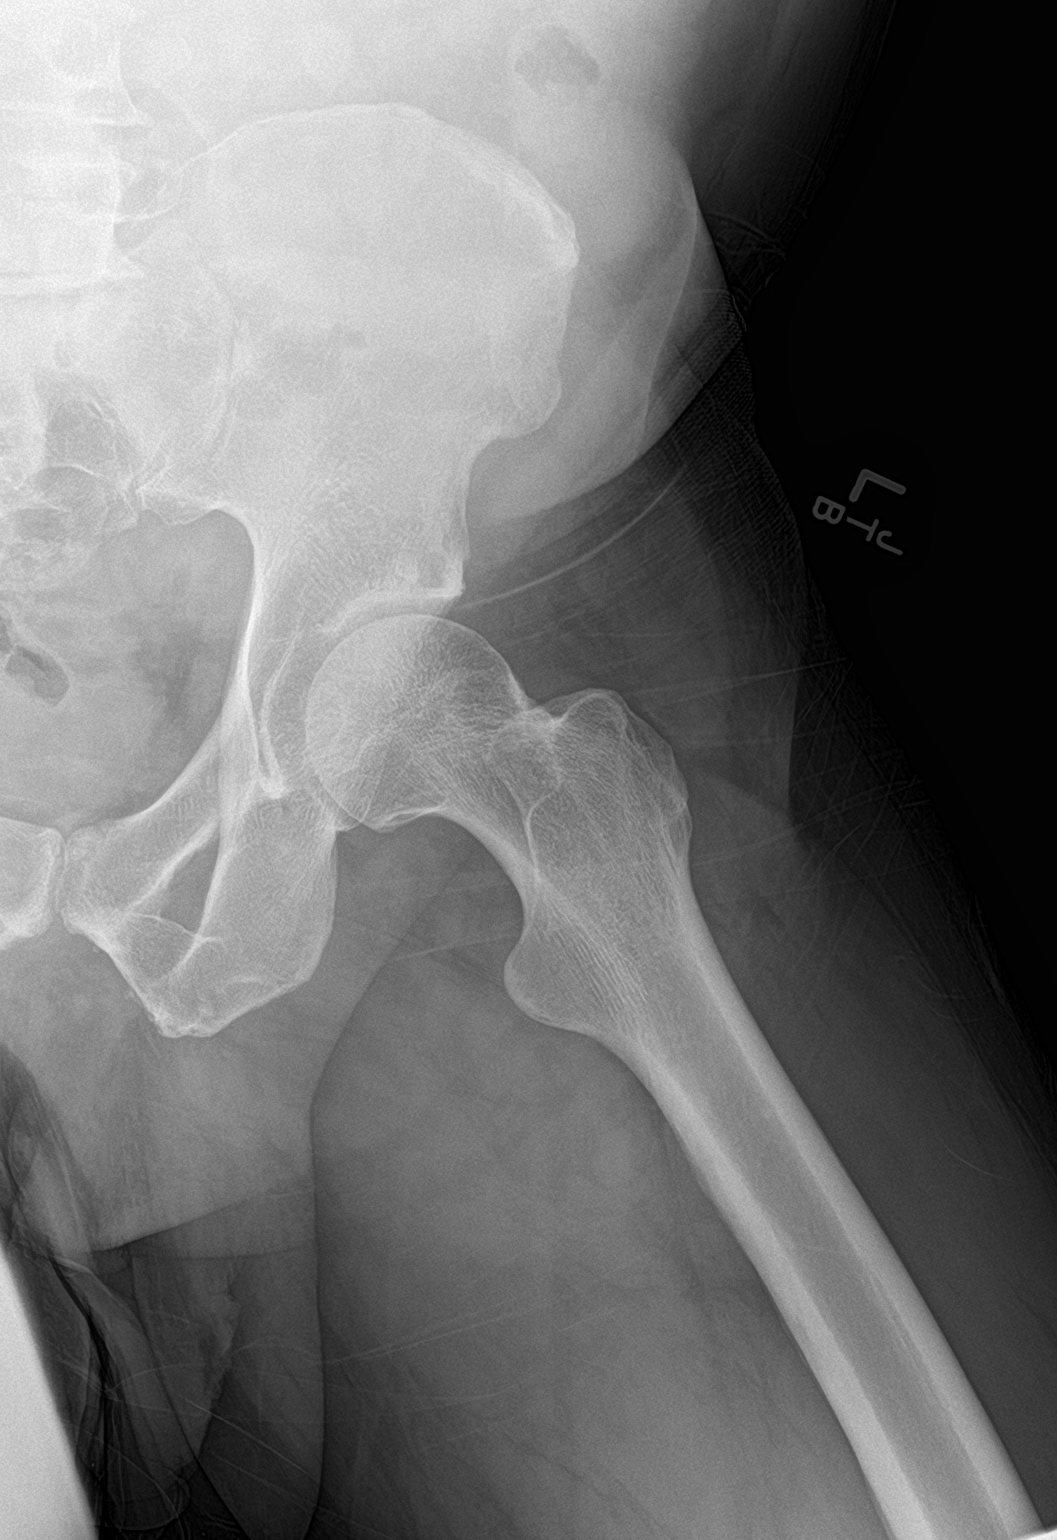

[3 of 3 positions shown; findings below may reference images not displayed]

FINDINGS: There is no evidence of hip fracture or dislocation. There is no
evidence of arthropathy or other focal bone abnormality. Mild
degenerative change of the spine.
IMPRESSION: No acute findings.  No significant degenerative changes of the hips.

## 2018-06-28 IMAGING — CT CT CERVICAL SPINE WITHOUT CONTRAST
4 series · 16 of 33 positions shown, 18 images · non-contrast
Comparison: None.

CLINICAL DATA: Pain and stiffness

EXAM:
CT CERVICAL SPINE WITHOUT CONTRAST
TECHNIQUE: Multidetector CT imaging of the cervical spine was performed without
intravenous contrast. Multiplanar CT image reconstructions were also
generated.

[Series 5: c spine soft · axial · 0.40mm/px · z∈[-224,-132]mm · 4 of 108 slices shown]
[im 16/108  soft-tissue]
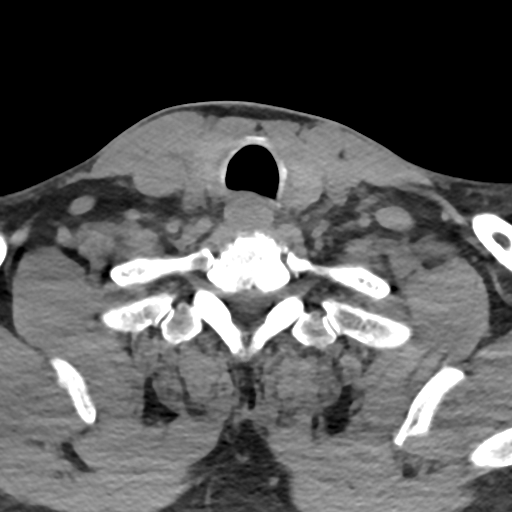
[im 31/108  soft-tissue]
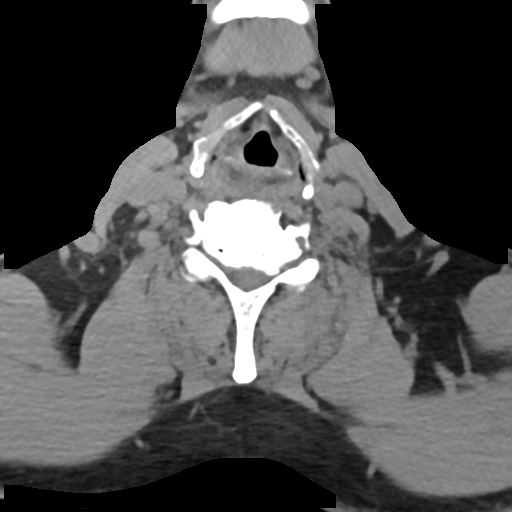
[im 46/108  soft-tissue]
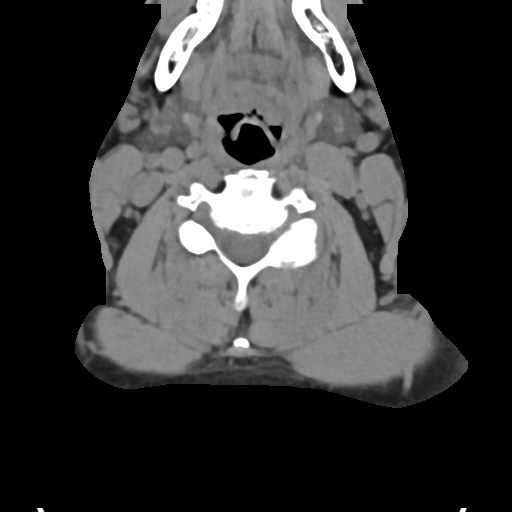
[im 62/108  soft-tissue]
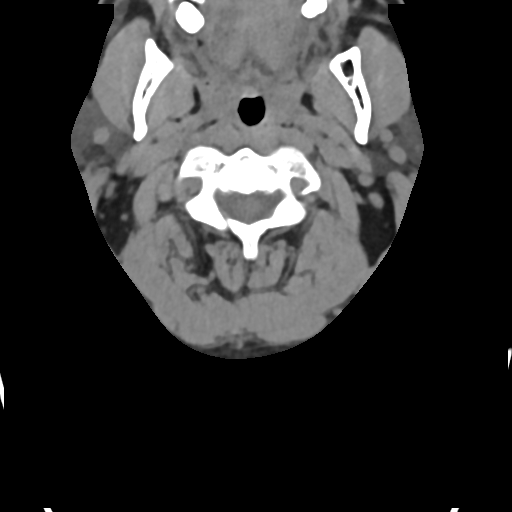

[Series 6: sag bone · sagittal · 0.40mm/px · 5 of 61 slices shown, 6 images]
[im 21/61  bone]
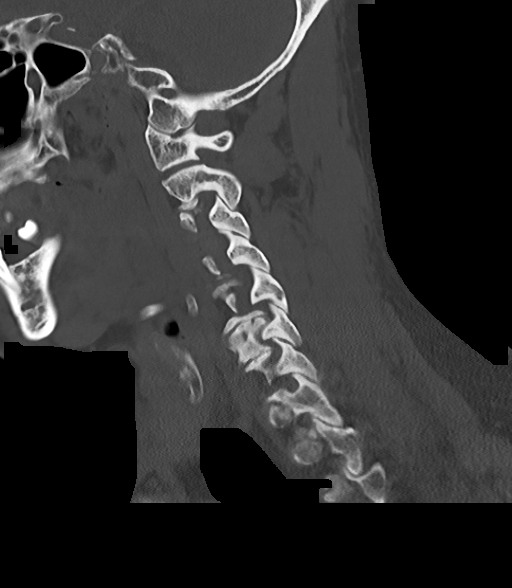
[im 26/61  bone]
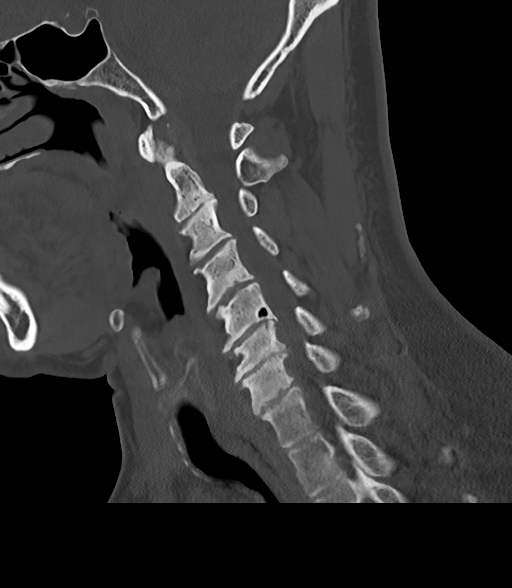
[im 31/61  soft-tissue]
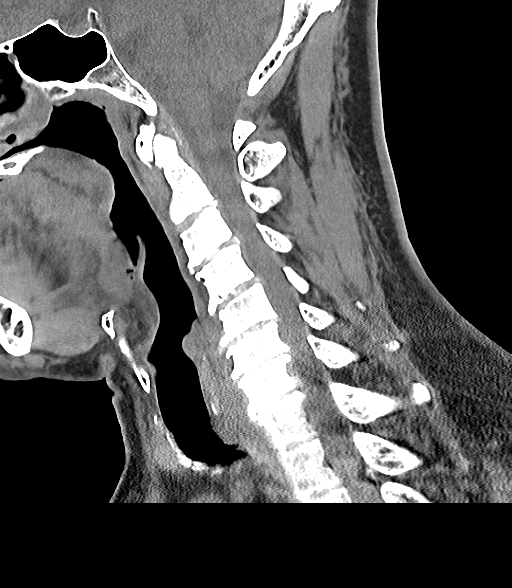
[im 31/61  bone]
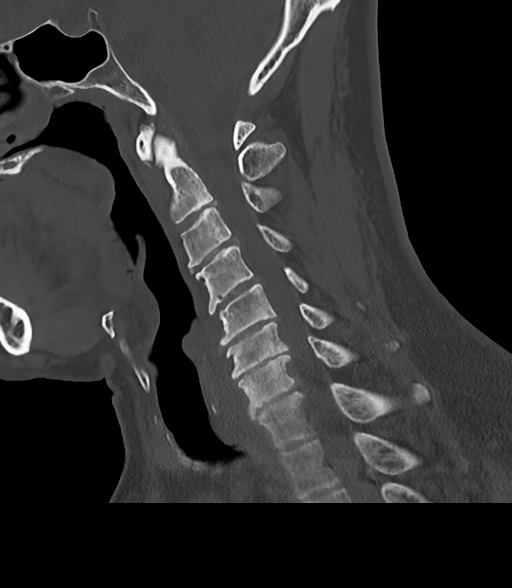
[im 36/61  bone]
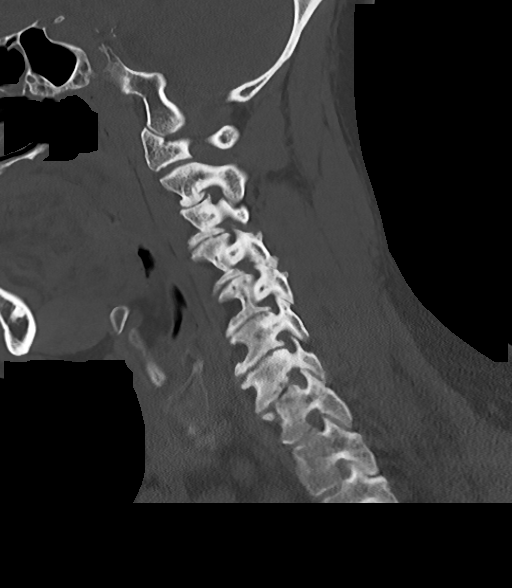
[im 41/61  bone]
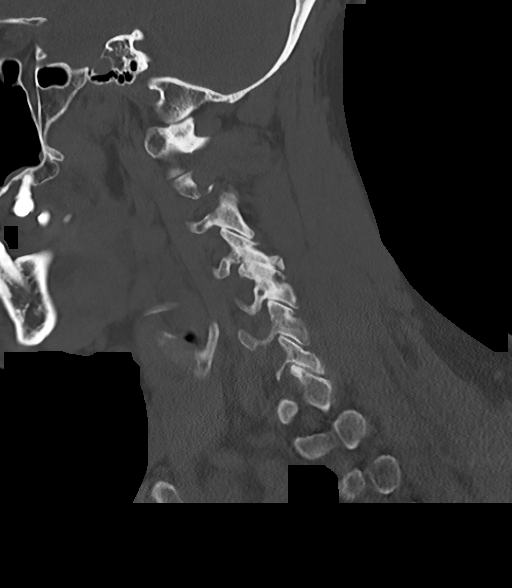

[Series 7: cor bone · coronal · 0.32mm/px · 3 of 72 slices shown]
[im 15/72  bone]
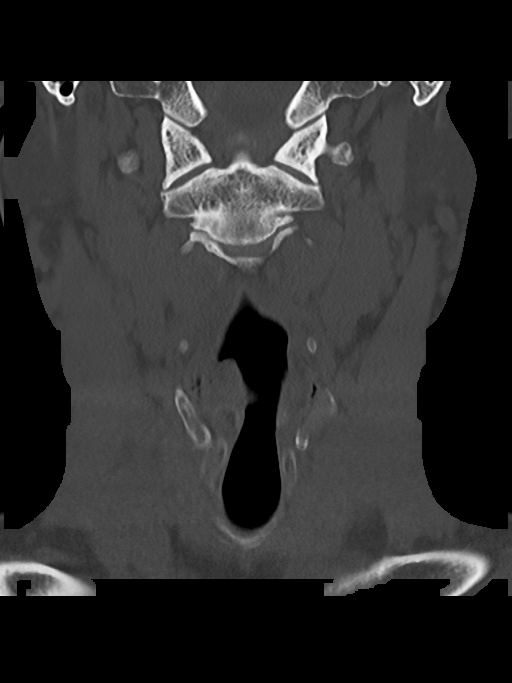
[im 29/72  bone]
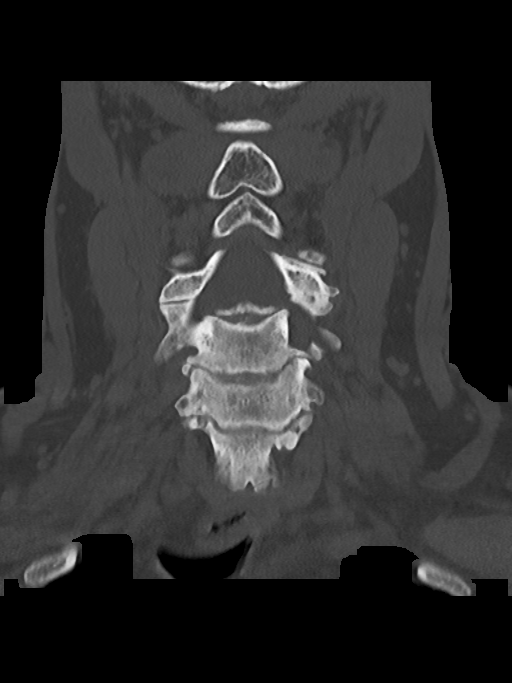
[im 43/72  bone]
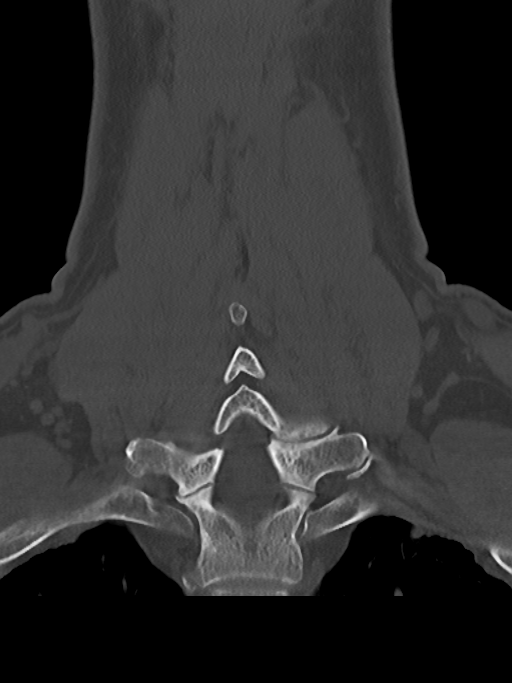

[Series 8: orthogonal axials · axial · 0.21mm/px · z∈[-233,-136]mm · 4 of 90 slices shown, 5 images]
[im 18/90  soft-tissue]
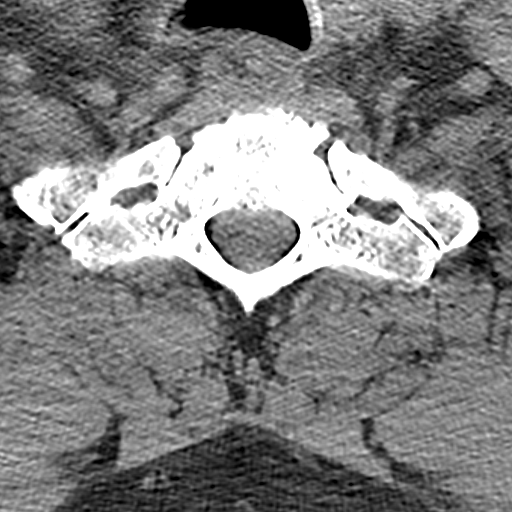
[im 18/90  bone]
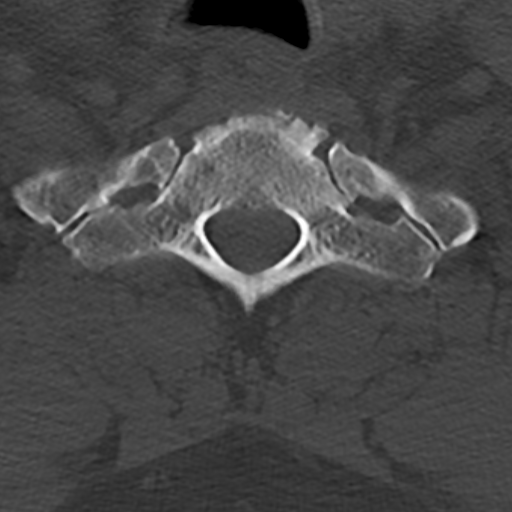
[im 36/90  bone]
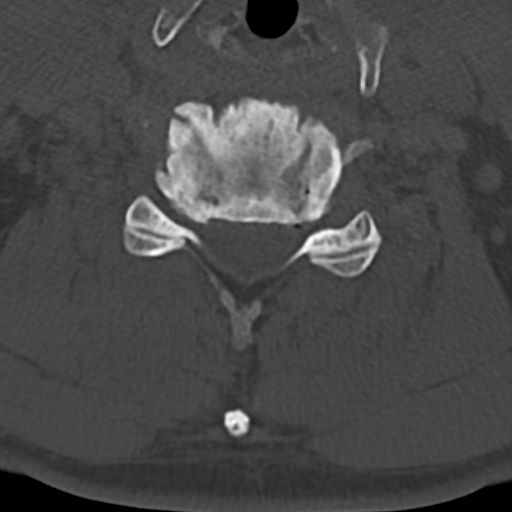
[im 54/90  bone]
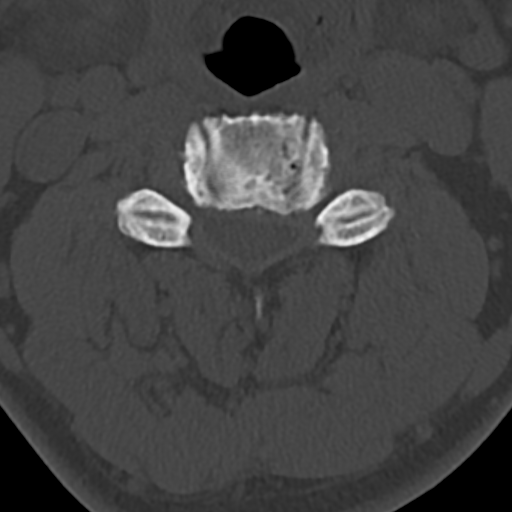
[im 72/90  bone]
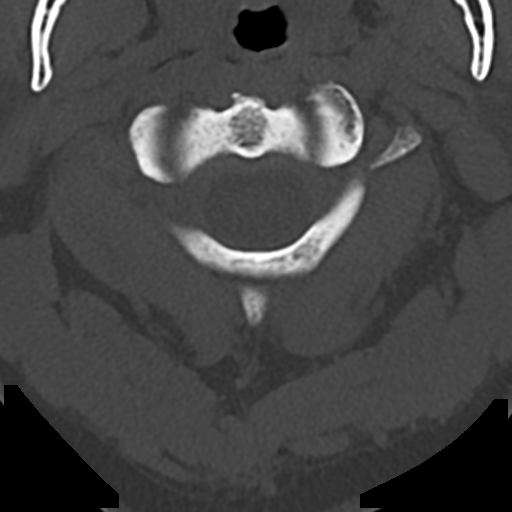

[16 of 33 positions shown; findings below may reference images not displayed]

FINDINGS: Alignment: Within normal limits.

Skull base and vertebrae: 7 cervical segments are well visualized.
Disc space narrowing is noted throughout the cervical spine with
associated osteophytic changes and mild facet hypertrophic changes.
No acute fracture or acute facet abnormality is noted. Mild neural
foraminal narrowing is noted bilaterally.

Soft tissues and spinal canal: Surrounding soft tissues are within
normal limits.

Upper chest: Within normal limits.

Other: None
IMPRESSION: Mild degenerative change without acute abnormality.

## 2018-06-28 MED ORDER — HYDROCODONE-ACETAMINOPHEN 5-325 MG PO TABS
1.0000 | ORAL_TABLET | Freq: Once | ORAL | Status: AC
  Administered 2018-06-28: 1 via ORAL
  Filled 2018-06-28: qty 1

## 2018-06-28 MED ORDER — CYCLOBENZAPRINE HCL 10 MG PO TABS
10.0000 mg | ORAL_TABLET | Freq: Once | ORAL | Status: AC
  Administered 2018-06-28: 10 mg via ORAL
  Filled 2018-06-28: qty 1

## 2018-06-28 MED ORDER — PREDNISONE 10 MG (21) PO TBPK: ORAL_TABLET | Freq: Every day | ORAL | 0 refills | Status: DC

## 2018-06-28 MED ORDER — CYCLOBENZAPRINE HCL 10 MG PO TABS: 10.0000 mg | ORAL_TABLET | Freq: Two times a day (BID) | ORAL | 0 refills | Status: DC | PRN

## 2018-06-28 NOTE — ED Triage Notes (Signed)
Pt complains of right hip pain X3. Also left neck pain X3. Pt is unsure how this happened.

## 2018-06-28 NOTE — ED Provider Notes (Signed)
[facility] Provider Note   CSN: [id number] Arrival date & time: 06/28/18  [id number]    History   Chief Complaint Chief Complaint  Patient presents with  . Hip Pain    HPI Terry Mckinney is a 58 y.o. male presents for evaluation of acute onset, progressively worsening left hip pain for 3 days and acute onset, constant left-sided neck pain beginning earlier today.  He reports that for the last 3 days or so he has had sharp stabbing stinging pains to the left hip radiating down into the lower extremity and up into the low back.  Pain worsens with attempts to ambulate and flex the hip.  Occasional numbness and tingling associated with this radiating down into the left lower extremity.  Earlier today as he was bending down to put on a pair of socks he experienced severe sharp shooting pain to the left side of the neck.  States that during this time he experienced numbness and some weakness to the left upper and lower extremities lasting around 5 minutes before resolving.  He notes some persistent numbness radiating down the medial aspect of the left upper extremity into all of the digits but worst in the fourth and fifth digits.  He has taken ibuprofen with little relief.  He denies any known trauma.  No fevers or chills.  No chest pain, shortness of breath, abdominal pain, nausea, or vomiting.  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     The history is provided by the patient.    Past Medical History:  Diagnosis Date  . Phobia     There are no active problems to display for this patient.   Past Surgical History:  Procedure Laterality Date  . ABDOMINAL SURGERY    . I&D EXTREMITY Right 11/07/2013   Procedure: IRRIGATION AND DEBRIDEMENT EXTREMITY;  Surgeon: Betha LoaKevin Kuzma, MD;  Location: [facility] OR;  Service: Orthopedics;  Laterality: Right;  . KNEE SURGERY          Home Medications    Prior to Admission medications   Medication Sig Start  Date End Date Taking? Authorizing Provider  cyclobenzaprine (FLEXERIL) 10 MG tablet Take 1 tablet (10 mg total) by mouth 2 (two) times daily as needed. 06/28/18   Breslyn Abdo A, PA-C  erythromycin ophthalmic ointment Place a 1/2 inch ribbon of ointment into the lower eyelid. Patient not taking: Reported on 04/23/2018 03/27/16   Demetrios LollLeaphart, Kenneth T, PA-C  HYDROcodone-acetaminophen (NORCO/VICODIN) 5-325 MG tablet Take 2 tablets by mouth every 4 (four) hours as needed. Patient not taking: Reported on 04/23/2018 03/27/16   Demetrios LollLeaphart, Kenneth T, PA-C  ibuprofen (ADVIL,MOTRIN) 200 MG tablet Take 200 mg by mouth daily as needed for mild pain.    [provider]  Liniments (BEN GAY EX) Apply 1 application topically daily as needed (muscle aches).    [provider]  predniSONE (STERAPRED UNI-PAK 21 TAB) 10 MG (21) TBPK tablet Take by mouth daily. Take 6 tablets on day 1, 5 tablets on day 2, 4 tablets on day 3, 3 tablets on day 4, 2 tablets on day 5, and 1 tablet on day 6 06/28/18   Jeanie SewerFawze, Matei Magnone A, PA-C    Family History Family History  Problem Relation Age of Onset  . Diabetes Other   . Hypertension Other     Social History Social History   Tobacco Use  . Smoking status: Former Smoker    Packs/day: 1.00  Types: Cigarettes  . Smokeless tobacco: Never Used  Substance Use Topics  . Alcohol use: Yes    Comment: socially  . Drug use: No     Allergies   Naproxen   Review of Systems Review of Systems  Constitutional: Negative for chills and fever.  Respiratory: Negative for shortness of breath.   Cardiovascular: Negative for chest pain.  Gastrointestinal: Negative for abdominal pain, nausea and vomiting.  Musculoskeletal: Positive for arthralgias and neck pain.  Neurological: Positive for weakness and numbness. Negative for headaches.  All other systems reviewed and are negative.    Physical Exam Updated Vital Signs BP (!) 131/91 (BP Location: Left Arm)   Pulse 62    Temp 98.2 F (36.8 C) (Oral)   Resp 12   Ht 5\' 9"  (1.753 m)   Wt 95.3 kg   SpO2 96%   BMI 31.01 kg/m   Physical Exam Vitals signs and nursing note reviewed.  Constitutional:      General: He is not in acute distress.    Appearance: He is well-developed.  HENT:     Head: Normocephalic and atraumatic.  Eyes:     General:        Right eye: No discharge.        Left eye: No discharge.     Conjunctiva/sclera: Conjunctivae normal.  Neck:     Vascular: No JVD.     Trachea: No tracheal deviation.  Cardiovascular:     Rate and Rhythm: Normal rate and regular rhythm.     Pulses: Normal pulses.     Comments: 2+ radial and DP/PT pulses bilaterally, Homans sign absent bilaterally, no lower extremity edema, no palpable cords, compartments are soft  Pulmonary:     Effort: Pulmonary effort is normal.     Breath sounds: Normal breath sounds.  Abdominal:     General: Bowel sounds are normal. There is no distension.     Palpations: Abdomen is soft.     Tenderness: There is no abdominal tenderness. There is no guarding or rebound.  Musculoskeletal:     Comments: There is some midline cervical spine tenderness at around the levels of C6-C7.  Left-sided paracervical muscle tenderness and spasm noted in the trapezius distribution.  No midline thoracic or lumbar spine tenderness.  Mild left paralumbar muscle tenderness and spasm noted.  Left SI joint tenderness and diffuse tenderness to palpation of the left hip with no erythema.  Normal passive range of motion with pain.  5/5 strength of BUE and BLE major muscle groups.  Skin:    General: Skin is warm and dry.     Findings: No erythema.  Neurological:     Mental Status: He is alert.     Comments: Fluent speech, no facial droop.  Cranial nerves appear grossly intact.  5/5 strength of BUE and BLE major muscle groups although limited effort on the left side secondary to pain.  Altered sensation to soft touch of the left lower extremity in the L5/S1  dermatomal distribution.  He ambulates with an antalgic gait, is able to heel walk but is unable to toe walk.  Psychiatric:        Behavior: Behavior normal.      ED Treatments / Results  Labs (all labs ordered are listed, but only abnormal results are displayed) Labs Reviewed - No data to display  EKG None  Radiology Ct Cervical Spine Wo Contrast  Result Date: 06/28/2018 CLINICAL DATA:  Pain and stiffness EXAM: CT CERVICAL SPINE  WITHOUT CONTRAST TECHNIQUE: Multidetector CT imaging of the cervical spine was performed without intravenous contrast. Multiplanar CT image reconstructions were also generated. COMPARISON:  None. FINDINGS: Alignment: Within normal limits. Skull base and vertebrae: 7 cervical segments are well visualized. Disc space narrowing is noted throughout the cervical spine with associated osteophytic changes and mild facet hypertrophic changes. No acute fracture or acute facet abnormality is noted. Mild neural foraminal narrowing is noted bilaterally. Soft tissues and spinal canal: Surrounding soft tissues are within normal limits. Upper chest: Within normal limits. Other: None IMPRESSION: Mild degenerative change without acute abnormality. Electronically Signed   By: Inez Catalina M.D.   On: 06/28/2018 09:28   Dg Hip Unilat With Pelvis 2-3 Views Left  Result Date: 06/28/2018 CLINICAL DATA:  Severe left lateral hip pain radiating down left leg 3 days. No injury. EXAM: DG HIP (WITH OR WITHOUT PELVIS) 2-3V LEFT COMPARISON:  Lumbar spine 02/18/2016 FINDINGS: There is no evidence of hip fracture or dislocation. There is no evidence of arthropathy or other focal bone abnormality. Mild degenerative change of the spine. IMPRESSION: No acute findings.  No significant degenerative changes of the hips. Electronically Signed   By: Marin Olp M.D.   On: 06/28/2018 09:20    Procedures Procedures (including critical care time)  Medications Ordered in ED Medications   HYDROcodone-acetaminophen (NORCO/VICODIN) 5-325 MG per tablet 1 tablet (1 tablet Oral Given 06/28/18 1001)  cyclobenzaprine (FLEXERIL) tablet 10 mg (10 mg Oral Given 06/28/18 1001)     Initial Impression / Assessment and Plan / ED Course  I have reviewed the triage vital signs and the nursing notes.  Pertinent labs & imaging results that were available during my care of the patient were reviewed by me and considered in my medical decision making (see chart for details).        Patient presenting for evaluation of left-sided hip pain for 3 days and left-sided neck pain today.  Pain is reproducible on palpation.  He is afebrile, mildly hypertensive but vital signs otherwise stable.  He is nontoxic but uncomfortable in appearance.  He is ambulatory with pain but weightbearing and exhibits good balance.  No concern for septic arthritis, osteomyelitis, DVT.  No concern for CVA.  Imaging shows degenerative changes with no acute abnormalities.  His pain was managed in the ED and on reevaluation he is resting comfortably in no apparent distress.  He feels comfortable with discharge home.  Suspect his pain is secondary to osteoarthritis.  Will discharge with steroid taper, Flexeril.  Discussed appropriate use of medications and attentional side effects.  Discussed conservative therapy and management.  Recommend follow-up with PCP or orthopedist for reevaluation of symptoms.  Discussed strict ED return precautions Pt verbalized understanding of and agreement with plan and is safe for discharge home at this time.    Final Clinical Impressions(s) / ED Diagnoses   Final diagnoses:  Neck pain on left side  Acute pain of left hip    ED Discharge Orders         Ordered    predniSONE (STERAPRED UNI-PAK 21 TAB) 10 MG (21) TBPK tablet  Daily     06/28/18 1050    cyclobenzaprine (FLEXERIL) 10 MG tablet  2 times daily PRN     06/28/18 1050           Nicola Quesnell, [place] A, PA-C 06/28/18 1058    Lajean Saver, MD 06/30/18 1428

## 2018-06-28 NOTE — Discharge Instructions (Signed)
1. Medications: Take steroid taper as prescribed with food to avoid upset stomach issues.  Do not take ibuprofen, Advil, Aleve, or Motrin while taking this medicine.  You may take [phone] mg of Tylenol every 6 hours as needed for pain. Do not exceed 4000 mg of Tylenol daily.  You can take Flexeril as needed for muscle relaxation but this medication may make you drowsy so do not drive, drink alcohol, operate heavy machinery, or make important decisions while you are using this medicine.  I typically recommend only taking this medicine at night.  You can also cut these tablets in half if they are very strong. 2. Treatment: rest, drink plenty of fluids, apply ice or heat whichever feels best for 20 minutes at a time 3-4 times daily, gentle stretching (see attached exercises, but you can also YouTube search hip and neck physical therapy exercises).   3. Follow Up: Please followup with orthopedics as directed or your PCP in 1 week if no improvement for discussion of your diagnoses and further evaluation after today's visit; if you do not have a primary care doctor use the resource guide provided to find one; Please return to the ER for worsening symptoms or other concerns such as worsening swelling, redness of the skin, fevers, loss of pulses, or loss of feeling

## 2019-02-10 ENCOUNTER — Emergency Department (HOSPITAL_COMMUNITY)
Admission: EM | Admit: 2019-02-10 | Discharge: 2019-02-10 | Disposition: A | Discharge status: Home / Self Care | Payer: BC Managed Care – PPO | Attending: Emergency Medicine | Admitting: Emergency Medicine

## 2019-02-10 ENCOUNTER — Other Ambulatory Visit: Payer: Self-pay

## 2019-02-10 ENCOUNTER — Encounter (HOSPITAL_COMMUNITY): Payer: Self-pay | Admitting: Emergency Medicine

## 2019-02-10 DIAGNOSIS — L0291 Cutaneous abscess, unspecified: Secondary | ICD-10-CM

## 2019-02-10 DIAGNOSIS — L02811 Cutaneous abscess of head [any part, except face]: Secondary | ICD-10-CM | POA: Diagnosis not present

## 2019-02-10 DIAGNOSIS — Z87891 Personal history of nicotine dependence: Secondary | ICD-10-CM | POA: Insufficient documentation

## 2019-02-10 DIAGNOSIS — R220 Localized swelling, mass and lump, head: Secondary | ICD-10-CM | POA: Diagnosis present

## 2019-02-10 MED ORDER — LIDOCAINE HCL 2 % IJ SOLN
10.0000 mL | Freq: Once | INTRAMUSCULAR | Status: AC
  Administered 2019-02-10: 200 mg
  Filled 2019-02-10: qty 20

## 2019-02-10 MED ORDER — SULFAMETHOXAZOLE-TRIMETHOPRIM 800-160 MG PO TABS: 1.0000 | ORAL_TABLET | Freq: Two times a day (BID) | ORAL | 0 refills | Status: AC

## 2019-02-10 NOTE — ED Provider Notes (Signed)
[facility] EMERGENCY DEPARTMENT Provider Note   CSN: [id number] Arrival date & time: 02/10/19  0630     History Chief Complaint  Patient presents with  . Abscess    Terry Mckinney is a 59 y.o. male.  The history is provided by the patient. No language interpreter was used.  Abscess Location:  Head/neck Head/neck abscess location:  Head Size:  1 Fluctuance: 3.   Red streaking: no   Duration:  3 days Progression:  Worsening Chronicity:  New Relieved by:  Nothing Worsened by:  Nothing Ineffective treatments:  None tried Associated symptoms: no nausea    Pt complains of a swollen area to the back of his scalp     Past Medical History:  Diagnosis Date  . Phobia     There are no problems to display for this patient.   Past Surgical History:  Procedure Laterality Date  . ABDOMINAL SURGERY    . I & D EXTREMITY Right 11/07/2013   Procedure: IRRIGATION AND DEBRIDEMENT EXTREMITY;  Surgeon: Betha Loa, MD;  Location: [facility] OR;  Service: Orthopedics;  Laterality: Right;  . KNEE SURGERY         Family History  Problem Relation Age of Onset  . Diabetes Other   . Hypertension Other     Social History   Tobacco Use  . Smoking status: Former Smoker    Packs/day: 1.00    Types: Cigarettes  . Smokeless tobacco: Never Used  Substance Use Topics  . Alcohol use: Yes    Comment: socially  . Drug use: No    Home Medications Prior to Admission medications   Medication Sig Start Date End Date Taking? Authorizing Provider  cyclobenzaprine (FLEXERIL) 10 MG tablet Take 1 tablet (10 mg total) by mouth 2 (two) times daily as needed. 06/28/18   Fawze, Mina A, PA-C  erythromycin ophthalmic ointment Place a 1/2 inch ribbon of ointment into the lower eyelid. Patient not taking: Reported on 04/23/2018 03/27/16   Demetrios Loll T, PA-C  HYDROcodone-acetaminophen (NORCO/VICODIN) 5-325 MG tablet Take 2 tablets by mouth every 4 (four) hours as needed. Patient not  taking: Reported on 04/23/2018 03/27/16   Demetrios Loll T, PA-C  ibuprofen (ADVIL,MOTRIN) 200 MG tablet Take 200 mg by mouth daily as needed for mild pain.    [provider]  Liniments (BEN GAY EX) Apply 1 application topically daily as needed (muscle aches).    [provider]  predniSONE (STERAPRED UNI-PAK 21 TAB) 10 MG (21) TBPK tablet Take by mouth daily. Take 6 tablets on day 1, 5 tablets on day 2, 4 tablets on day 3, 3 tablets on day 4, 2 tablets on day 5, and 1 tablet on day 6 06/28/18   Fawze, Mina A, PA-C  sulfamethoxazole-trimethoprim (BACTRIM DS) 800-160 MG tablet Take 1 tablet by mouth 2 (two) times daily for 7 days. 02/10/19 02/17/19  Elson Areas, PA-C    Allergies    Naproxen  Review of Systems   Review of Systems  Gastrointestinal: Negative for nausea.  All other systems reviewed and are negative.   Physical Exam Updated Vital Signs BP (!) 154/110 (BP Location: Left Arm)   Pulse 75   Temp 99.2 F (37.3 C) (Oral)   Resp 20   Ht 5\' 9"  (1.753 m)   Wt 95.3 kg   SpO2 98%   BMI 31.01 kg/m   Physical Exam Vitals reviewed.  Cardiovascular:     Rate and Rhythm: Normal rate.  Pulmonary:  Effort: Pulmonary effort is normal.  Musculoskeletal:        General: Normal range of motion.  Skin:    General: Skin is warm.  Neurological:     General: No focal deficit present.     Mental Status: He is alert.  Psychiatric:        Mood and Affect: Mood normal.     ED Results / Procedures / Treatments   Labs (all labs ordered are listed, but only abnormal results are displayed) Labs Reviewed - No data to display  EKG None  Radiology No results found.  Procedures .Marland KitchenIncision and Drainage  Date/Time: 02/10/2019 8:12 AM Performed by: Fransico Meadow, PA-C Authorized by: Fransico Meadow, PA-C   Consent:    Consent obtained:  Verbal   Consent given by:  Patient   Risks discussed:  Bleeding   Alternatives discussed:  No treatment Location:      Type:  Abscess   Size:  3 Pre-procedure details:    Skin preparation:  Betadine Anesthesia (see MAR for exact dosages):    Anesthesia method:  Local infiltration   Local anesthetic:  Lidocaine 2% w/o epi Procedure type:    Complexity:  Simple Procedure details:    Needle aspiration: no     Incision types:  Single straight and cruciate   Scalpel blade:  11   Wound management:  Probed and deloculated and irrigated with saline   Drainage:  Purulent   Drainage amount:  Moderate   Wound treatment:  Wound left open   Packing materials:  None Post-procedure details:    Patient tolerance of procedure:  Tolerated well, no immediate complications   (including critical care time)  Medications Ordered in ED Medications  lidocaine (XYLOCAINE) 2 % (with pres) injection 200 mg (200 mg Infiltration Given 02/10/19 0737)    ED Course  I have reviewed the triage vital signs and the nursing notes.  Pertinent labs & imaging results that were available during my care of the patient were reviewed by me and considered in my medical decision making (see chart for details).    MDM Rules/Calculators/A&P                      MDM  Pt counseled on wound care.  Rx for bactrim  Final Clinical Impression(s) / ED Diagnoses Final diagnoses:  Abscess    Rx / DC Orders ED Discharge Orders         Ordered    sulfamethoxazole-trimethoprim (BACTRIM DS) 800-160 MG tablet  2 times daily     02/10/19 0804        An After Visit Summary was printed and given to the patient.    Fransico Meadow, [place] 02/10/19 0813    Carmin Muskrat, MD 02/10/19 1003

## 2019-02-10 NOTE — ED Notes (Signed)
Pt has had an abscess at the base of his skull  For several days  Last pm the area swelled up  Larger than usual   Much pain

## 2019-02-10 NOTE — Discharge Instructions (Signed)
Return if any problems.

## 2019-02-10 NOTE — ED Triage Notes (Signed)
Pt reports large lump to the back of his head for the past few days, states it has gone down some in size, denies any drainage.

## 2019-08-20 ENCOUNTER — Emergency Department (HOSPITAL_COMMUNITY)
Admission: EM | Admit: 2019-08-20 | Discharge: 2019-08-21 | Disposition: A | Discharge status: Home / Self Care | Payer: BC Managed Care – PPO | Attending: Emergency Medicine | Admitting: Emergency Medicine

## 2019-08-20 ENCOUNTER — Encounter (HOSPITAL_COMMUNITY): Payer: Self-pay | Admitting: Emergency Medicine

## 2019-08-20 ENCOUNTER — Other Ambulatory Visit: Payer: Self-pay

## 2019-08-20 ENCOUNTER — Emergency Department (HOSPITAL_COMMUNITY): Payer: BC Managed Care – PPO

## 2019-08-20 DIAGNOSIS — M79602 Pain in left arm: Secondary | ICD-10-CM | POA: Diagnosis not present

## 2019-08-20 DIAGNOSIS — Z5321 Procedure and treatment not carried out due to patient leaving prior to being seen by health care provider: Secondary | ICD-10-CM | POA: Diagnosis not present

## 2019-08-20 DIAGNOSIS — R079 Chest pain, unspecified: Secondary | ICD-10-CM | POA: Diagnosis present

## 2019-08-20 LAB — BASIC METABOLIC PANEL
Anion gap: 14 (ref 5–15)
BUN: 10 mg/dL (ref 6–20)
CO2: 22 mmol/L (ref 22–32)
Calcium: 8.9 mg/dL (ref 8.9–10.3)
Chloride: 99 mmol/L (ref 98–111)
Creatinine, Ser: 1.05 mg/dL (ref 0.61–1.24)
GFR calc Af Amer: >60 mL/min (ref >60)
GFR calc non Af Amer: >60 mL/min (ref >60)
Glucose, Bld: 175 mg/dL — ABNORMAL HIGH (ref 70–99)
Potassium: 3.3 mmol/L — ABNORMAL LOW (ref 3.5–5.1)
Sodium: 135 mmol/L (ref 135–145)

## 2019-08-20 LAB — CBC
HCT: 42.5 % (ref 39.0–52.0)
Hemoglobin: 14.9 g/dL (ref 13.0–17.0)
MCH: 28.3 pg (ref 26.0–34.0)
MCHC: 35.1 g/dL (ref 30.0–36.0)
MCV: 80.6 fL (ref 80.0–100.0)
Platelets: 311 10*3/uL (ref 150–400)
RBC: 5.27 MIL/uL (ref 4.22–5.81)
RDW: 13.1 % (ref 11.5–15.5)
WBC: 6.1 10*3/uL (ref 4.0–10.5)
nRBC: 0 % (ref 0.0–0.2)

## 2019-08-20 LAB — TROPONIN I (HIGH SENSITIVITY)
Troponin I (High Sensitivity): 4 ng/L (ref <18)
Troponin I (High Sensitivity): 4 ng/L (ref <18)

## 2019-08-20 IMAGING — CR DG CHEST 2V
2 series · 2 of 2 positions shown · non-contrast
Comparison: [DATE]

CLINICAL DATA: Chest pain.

EXAM:
CHEST - 2 VIEW

[chest pa]
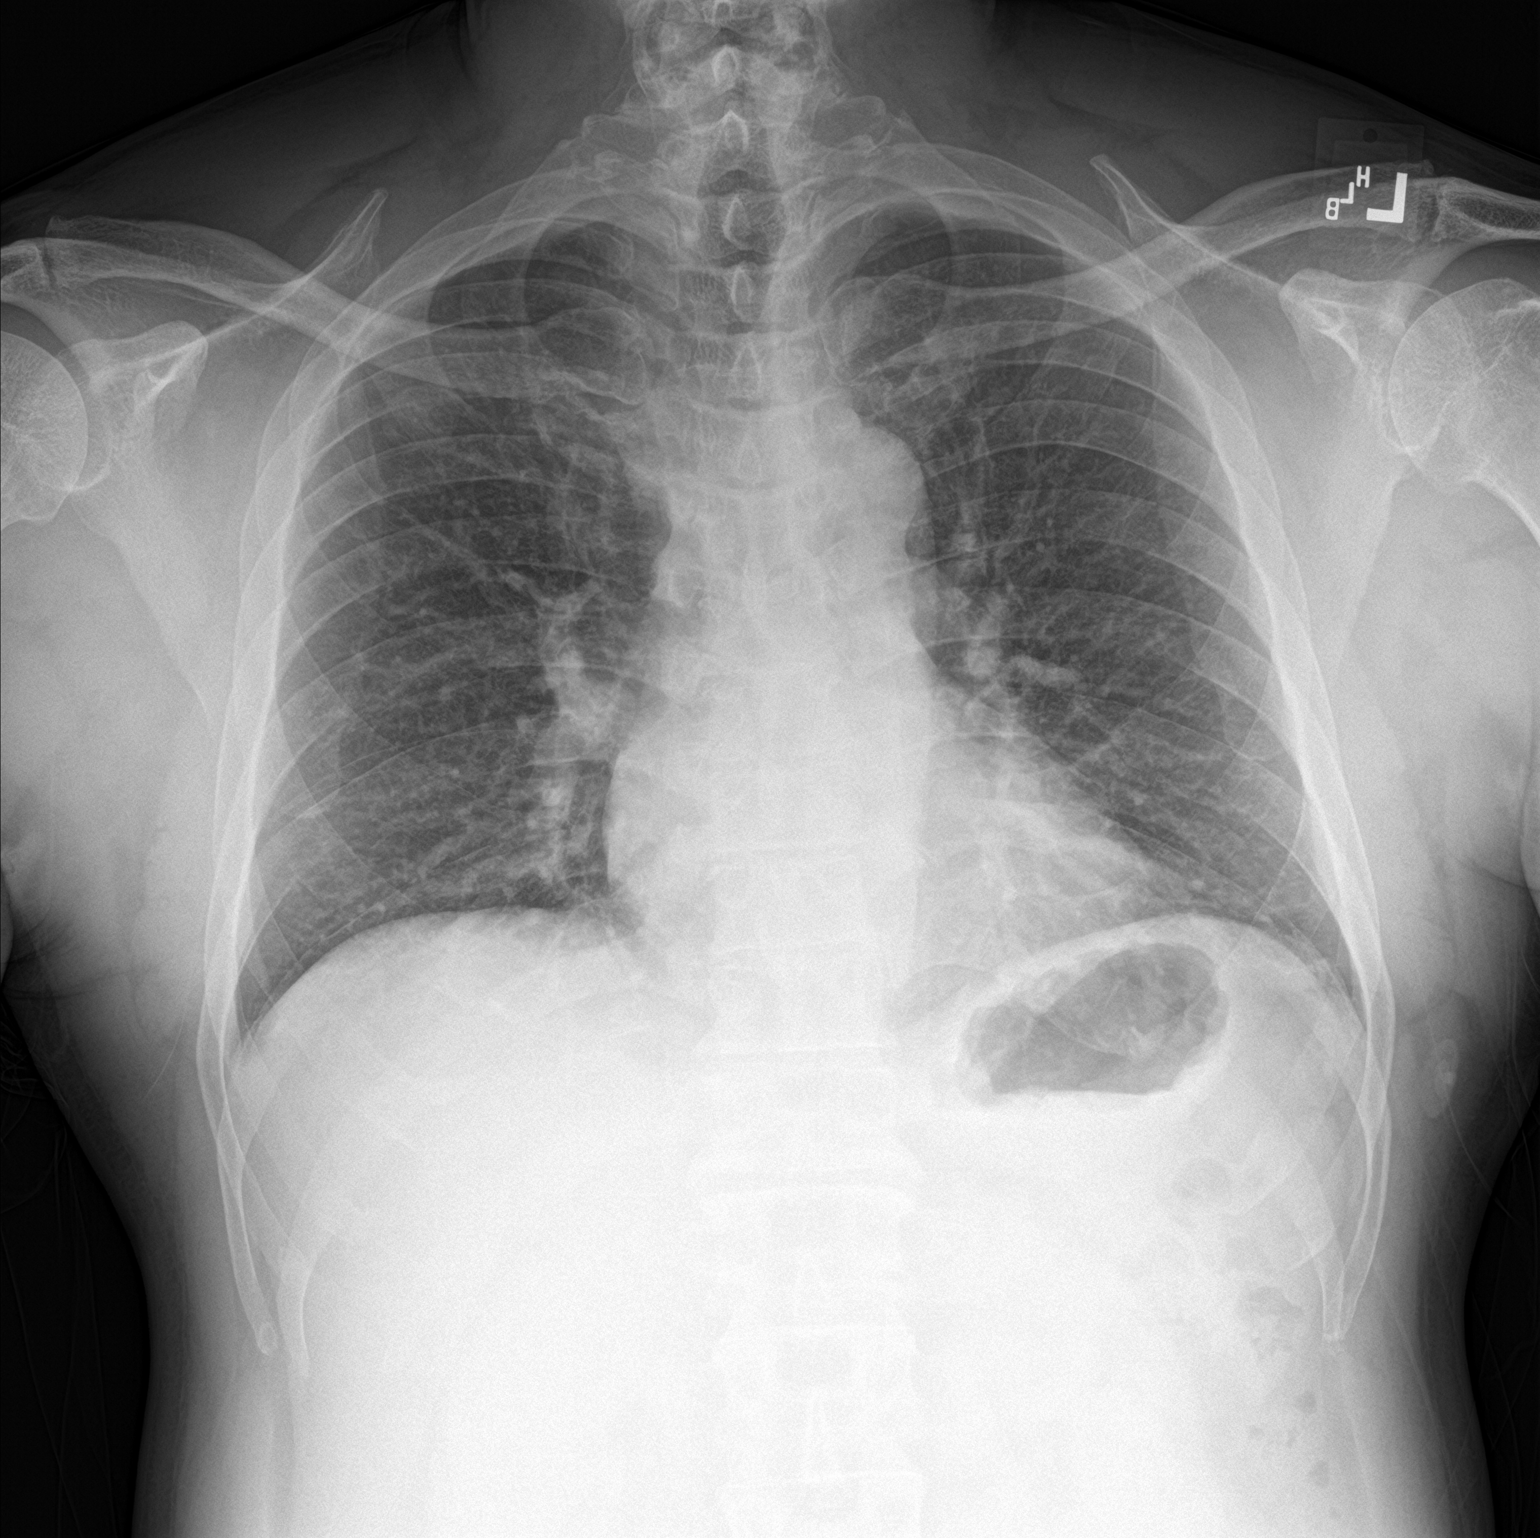

[chest lat]
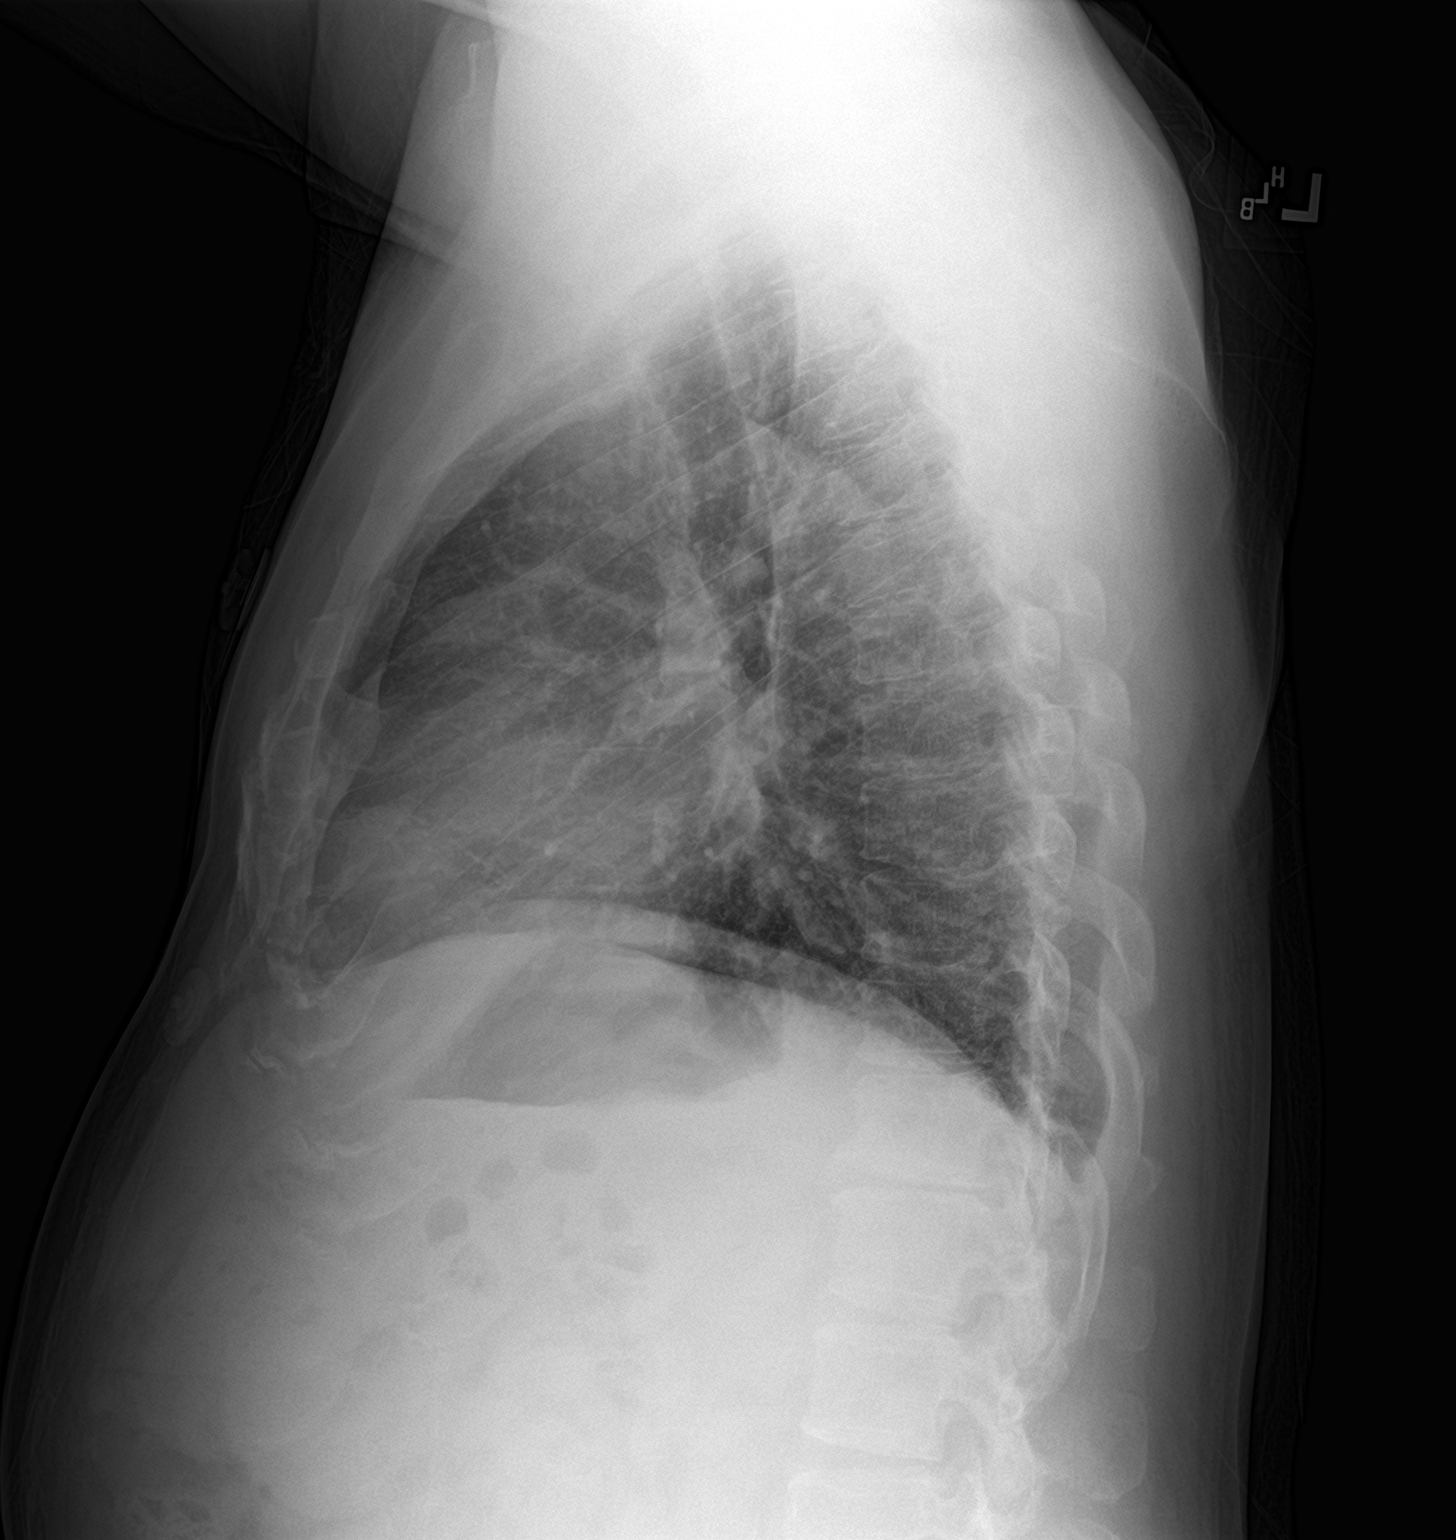

[2 of 2 positions shown; findings below may reference images not displayed]

FINDINGS: The heart size and mediastinal contours are within normal limits.
Both lungs are clear. The visualized skeletal structures are
unremarkable.
IMPRESSION: No active cardiopulmonary disease.

## 2019-08-20 MED ORDER — SODIUM CHLORIDE 0.9% FLUSH: 3.0000 mL | Freq: Once | INTRAVENOUS | Status: DC

## 2019-08-20 NOTE — ED Triage Notes (Signed)
Pt c/o sharp chest pain off and on x's 1 week.  St's when he has the pain it radiates down left arm.  Pt denies any pain at this time

## 2019-08-21 NOTE — ED Notes (Signed)
Stated he needed to go pick his wife up and was leaving.

## 2020-01-15 ENCOUNTER — Emergency Department (HOSPITAL_COMMUNITY)
Admission: EM | Admit: 2020-01-15 | Discharge: 2020-01-15 | Disposition: A | Discharge status: Home / Self Care | Payer: BC Managed Care – PPO

## 2020-01-15 ENCOUNTER — Encounter (HOSPITAL_BASED_OUTPATIENT_CLINIC_OR_DEPARTMENT_OTHER): Payer: Self-pay | Admitting: *Deleted

## 2020-01-15 ENCOUNTER — Emergency Department (HOSPITAL_BASED_OUTPATIENT_CLINIC_OR_DEPARTMENT_OTHER)
Admission: EM | Admit: 2020-01-15 | Discharge: 2020-01-15 | Disposition: A | Discharge status: Home / Self Care | Payer: BC Managed Care – PPO | Attending: Emergency Medicine | Admitting: Emergency Medicine

## 2020-01-15 ENCOUNTER — Other Ambulatory Visit: Payer: Self-pay

## 2020-01-15 DIAGNOSIS — E119 Type 2 diabetes mellitus without complications: Secondary | ICD-10-CM | POA: Diagnosis not present

## 2020-01-15 DIAGNOSIS — Z87891 Personal history of nicotine dependence: Secondary | ICD-10-CM | POA: Diagnosis not present

## 2020-01-15 DIAGNOSIS — M545 Low back pain, unspecified: Secondary | ICD-10-CM | POA: Diagnosis present

## 2020-01-15 LAB — URINALYSIS, ROUTINE W REFLEX MICROSCOPIC
Bilirubin Urine: NEGATIVE
Glucose, UA: NEGATIVE mg/dL
Hgb urine dipstick: NEGATIVE
Ketones, ur: NEGATIVE mg/dL
Leukocytes,Ua: NEGATIVE
Nitrite: NEGATIVE
Protein, ur: NEGATIVE mg/dL
Specific Gravity, Urine: 1.03 (ref 1.005–1.030)
pH: 5 (ref 5.0–8.0)

## 2020-01-15 MED ORDER — CYCLOBENZAPRINE HCL 10 MG PO TABS
10.0000 mg | ORAL_TABLET | Freq: Two times a day (BID) | ORAL | 0 refills | Status: DC | PRN
Start: 2020-01-15 — End: 2020-03-07

## 2020-01-15 MED ORDER — ACETAMINOPHEN 500 MG PO TABS
1000.0000 mg | ORAL_TABLET | Freq: Once | ORAL | Status: AC
  Administered 2020-01-15: 09:00:00 1000 mg via ORAL
  Filled 2020-01-15: qty 2

## 2020-01-15 MED ORDER — OXYCODONE HCL 5 MG PO TABS
10.0000 mg | ORAL_TABLET | Freq: Once | ORAL | Status: AC
  Administered 2020-01-15: 09:00:00 10 mg via ORAL
  Filled 2020-01-15: qty 2

## 2020-01-15 MED ORDER — LIDOCAINE 5 % EX PTCH
1.0000 | MEDICATED_PATCH | CUTANEOUS | 0 refills | Status: DC
Start: 2020-01-15 — End: 2021-11-02

## 2020-01-15 NOTE — ED Provider Notes (Signed)
[facility]EM[facility]Y [facility] Emergency Department Provider Note MRN:  [id number]  Arrival date & time: 01/15/20     Chief Complaint   No chief complaint on file.   History of Present Illness   Shadrack Brummitt is a 59 y.o. year-old male with a history of diabetes presenting to the ED with chief complaint of back pain.  Location: Left lumbar back with radiation into the left leg Duration: 2 or 3 days, first started when lifting something heavy at work Onset: Sudden Timing: Constant, progressively worsening. Description: Lambert Mody Severity: Severe Exacerbating/Alleviating Factors: Worse with motion or palpation Associated Symptoms: None Pertinent Negatives: Denies dizziness or sweatiness, no chest pain or shortness of breath, no abdominal pain, no hematuria or dysuria, no numbness or weakness to the arms or legs, no bowel or bladder dysfunction   Review of Systems  A complete 10 system review of systems was obtained and all systems are negative except as noted in the HPI and PMH.   Patient's Health History    Past Medical History:  Diagnosis Date  . Diabetes mellitus without complication (HCC)   . Phobia     Past Surgical History:  Procedure Laterality Date  . ABDOMINAL SURGERY    . I & D EXTREMITY Right 11/07/2013   Procedure: IRRIGATION AND DEBRIDEMENT EXTREMITY;  Surgeon: Betha Loa, MD;  Location: [facility] OR;  Service: Orthopedics;  Laterality: Right;  . KNEE SURGERY      Family History  Problem Relation Age of Onset  . Diabetes Other   . Hypertension Other   . Diabetes Mother     Social History   Socioeconomic History  . Marital status: Single    Spouse name: Not on file  . Number of children: Not on file  . Years of education: Not on file  . Highest education level: Not on file  Occupational History  . Not on file  Tobacco Use  . Smoking status: Former Smoker    Packs/day: 1.00    Types: Cigarettes  . Smokeless tobacco: Never Used  Substance and  Sexual Activity  . Alcohol use: Yes    Comment: socially  . Drug use: No  . Sexual activity: Not on file  Other Topics Concern  . Not on file  Social History Narrative  . Not on file   Social Determinants of Health   Financial Resource Strain: Not on file  Food Insecurity: Not on file  Transportation Needs: Not on file  Physical Activity: Not on file  Stress: Not on file  Social Connections: Not on file  Intimate Partner Violence: Not on file     Physical Exam   Vitals:   01/15/20 0824 01/15/20 0901  BP: 124/81 134/89  Pulse: 61 60  Resp: 16 18  Temp: 98.4 F (36.9 C) 98.7 F (37.1 C)  SpO2:  100%    CONSTITUTIONAL: Well-appearing, NAD NEURO:  Alert and oriented x 3, normal and symmetric strength and sensation EYES:  eyes equal and reactive ENT/NECK:  no LAD, no JVD CARDIO: Regular rate, well-perfused, normal S1 and S2 PULM:  CTAB no wheezing or rhonchi GI/GU:  normal bowel sounds, non-distended, non-tender MSK/SPINE:  No gross deformities, no edema; tenderness palpation to the left lumbar back SKIN:  no rash, atraumatic PSYCH:  Appropriate speech and behavior  *Additional and/or pertinent findings included in MDM below  Diagnostic and Interventional Summary    EKG Interpretation  Date/Time:    Ventricular Rate:    PR Interval:  QRS Duration:   QT Interval:    QTC Calculation:   R Axis:     Text Interpretation:        Labs Reviewed  URINALYSIS, ROUTINE W REFLEX MICROSCOPIC    No orders to display    Medications  acetaminophen (TYLENOL) tablet 1,000 mg (1,000 mg Oral Given 01/15/20 0919)  oxyCODONE (Oxy IR/ROXICODONE) immediate release tablet 10 mg (10 mg Oral Given 01/15/20 0919)     Procedures  /  Critical Care Procedures  ED Course and Medical Decision Making  I have reviewed the triage vital signs, the nursing notes, and pertinent available records from the EMR.  Listed above are laboratory and imaging tests that I personally ordered,  reviewed, and interpreted and then considered in my medical decision making (see below for details).  Suspect muscle strain or spasm versus herniated disc, nothing to suggest myelopathy, reassuring exam, normal vital signs, appropriate for discharge with pain control. No significant trauma, no indication for imaging at this time       Elmer Sow. Pilar Plate, MD [facility] Emergency Medicine [facility] [email or web address]  Final Clinical Impressions(s) / ED Diagnoses     ICD-10-CM   1. Acute left-sided low back pain, unspecified whether sciatica present  M54.50     ED Discharge Orders         Ordered    lidocaine (LIDODERM) 5 %  Every 24 hours        01/15/20 0951    cyclobenzaprine (FLEXERIL) 10 MG tablet  2 times daily PRN        01/15/20 0951           Discharge Instructions Discussed with and Provided to Patient:     Discharge Instructions     You were evaluated in the Emergency Department and after careful evaluation, we did not find any emergent condition requiring admission or further testing in the hospital.  Your exam/testing today was overall reassuring. Symptoms seem to be due to muscle strain or spasm or possibly a ruptured disc in the back. We recommend Tylenol 1000 mg every 4-6 hours. Please use the Lidoderm patches during the day for pain. For more significant pain you can use the Flexeril muscle relaxer provided but use caution as this medication can cause drowsiness.  Please return to the Emergency Department if you experience any worsening of your condition.  Thank you for allowing [place] to be a part of your care.       Sabas Sous, MD 01/15/20 1000

## 2020-01-15 NOTE — Discharge Instructions (Signed)
You were evaluated in the Emergency Department and after careful evaluation, we did not find any emergent condition requiring admission or further testing in the hospital.  Your exam/testing today was overall reassuring. Symptoms seem to be due to muscle strain or spasm or possibly a ruptured disc in the back. We recommend Tylenol 1000 mg every 4-6 hours. Please use the Lidoderm patches during the day for pain. For more significant pain you can use the Flexeril muscle relaxer provided but use caution as this medication can cause drowsiness.  Please return to the Emergency Department if you experience any worsening of your condition.  Thank you for allowing [place] to be a part of your care.

## 2020-01-15 NOTE — ED Triage Notes (Signed)
Left flank pain radiating to his left knee since last night. No injury.

## 2020-01-15 NOTE — ED Notes (Signed)
Called for triage x1, no answer 

## 2020-01-15 NOTE — ED Notes (Signed)
Pt is claustrophobic, and likes the door cracked.

## 2020-03-07 ENCOUNTER — Emergency Department (HOSPITAL_BASED_OUTPATIENT_CLINIC_OR_DEPARTMENT_OTHER)
Admission: EM | Admit: 2020-03-07 | Discharge: 2020-03-07 | Disposition: A | Discharge status: Home / Self Care | Payer: BC Managed Care – PPO | Attending: Emergency Medicine | Admitting: Emergency Medicine

## 2020-03-07 ENCOUNTER — Emergency Department (HOSPITAL_BASED_OUTPATIENT_CLINIC_OR_DEPARTMENT_OTHER): Payer: BC Managed Care – PPO

## 2020-03-07 ENCOUNTER — Encounter (HOSPITAL_BASED_OUTPATIENT_CLINIC_OR_DEPARTMENT_OTHER): Payer: Self-pay | Admitting: *Deleted

## 2020-03-07 ENCOUNTER — Other Ambulatory Visit: Payer: Self-pay

## 2020-03-07 DIAGNOSIS — F17290 Nicotine dependence, other tobacco product, uncomplicated: Secondary | ICD-10-CM | POA: Diagnosis not present

## 2020-03-07 DIAGNOSIS — M5442 Lumbago with sciatica, left side: Secondary | ICD-10-CM | POA: Insufficient documentation

## 2020-03-07 DIAGNOSIS — M5432 Sciatica, left side: Secondary | ICD-10-CM

## 2020-03-07 DIAGNOSIS — M545 Low back pain, unspecified: Secondary | ICD-10-CM | POA: Diagnosis present

## 2020-03-07 IMAGING — DX DG LUMBAR SPINE COMPLETE 4+V
5 series · 5 of 5 positions shown · non-contrast
Comparison: [DATE]

CLINICAL DATA: Low back and hip pain for 1-2 weeks

EXAM:
LUMBAR SPINE - COMPLETE 4+ VIEW

[l-spine ap]
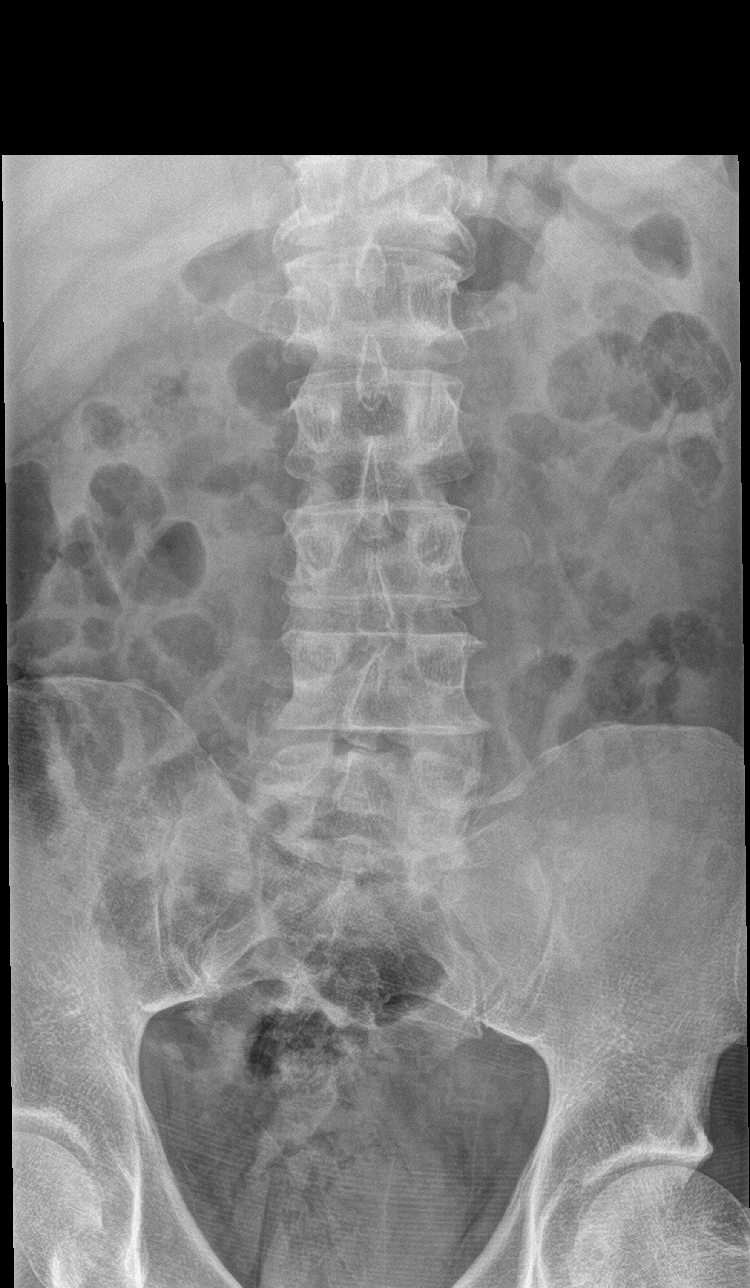

[l-spine obl (1 of 2)]
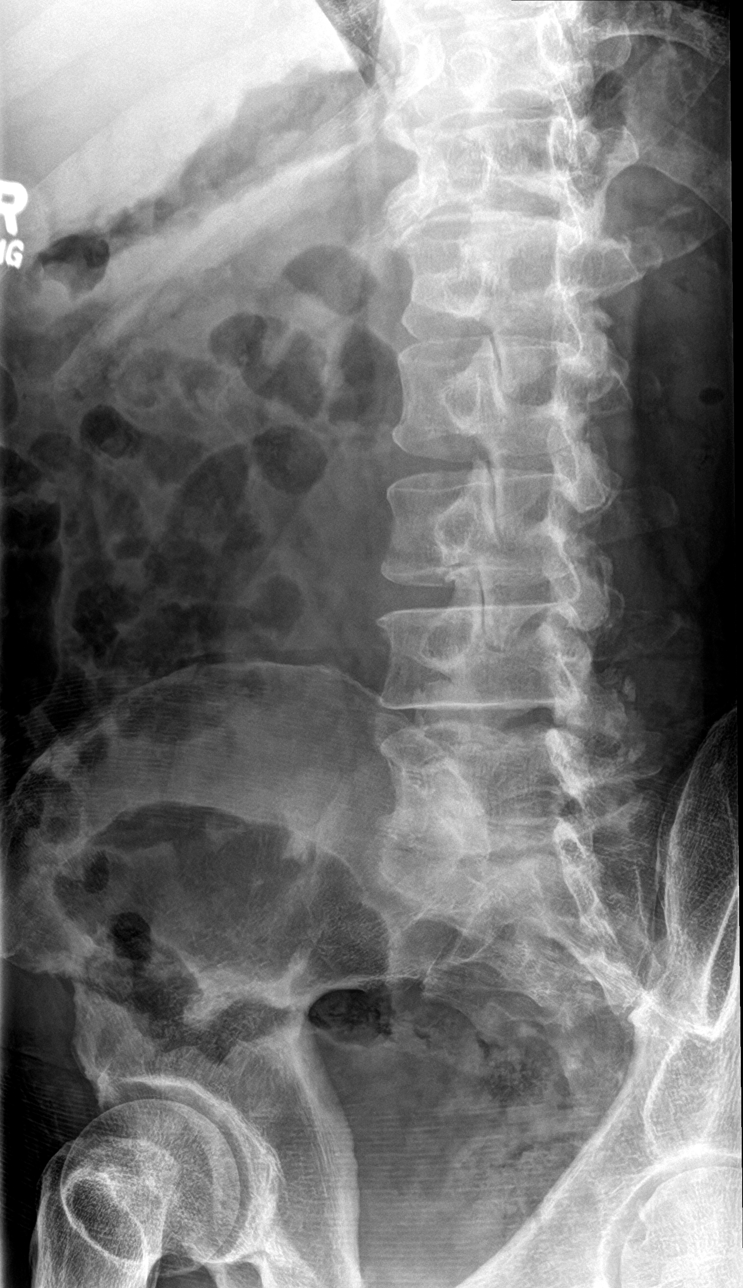

[l-spine obl (2 of 2)]
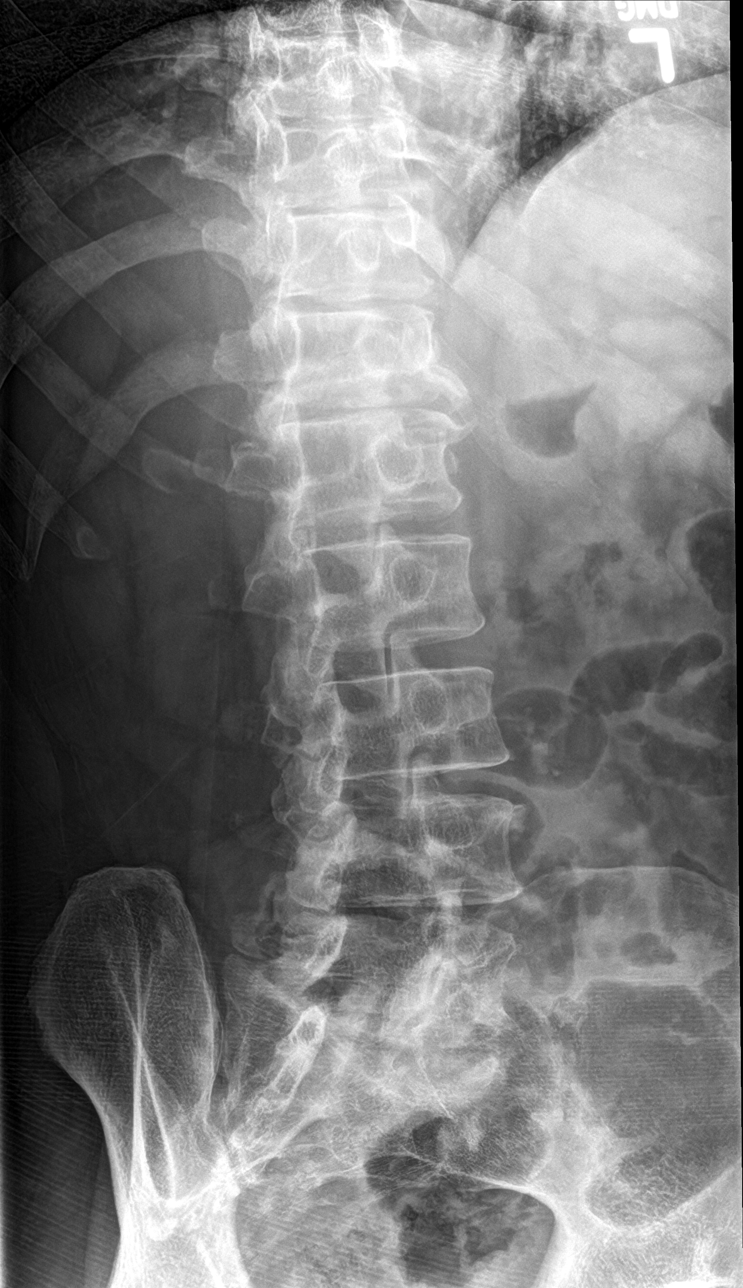

[l-spine lat]
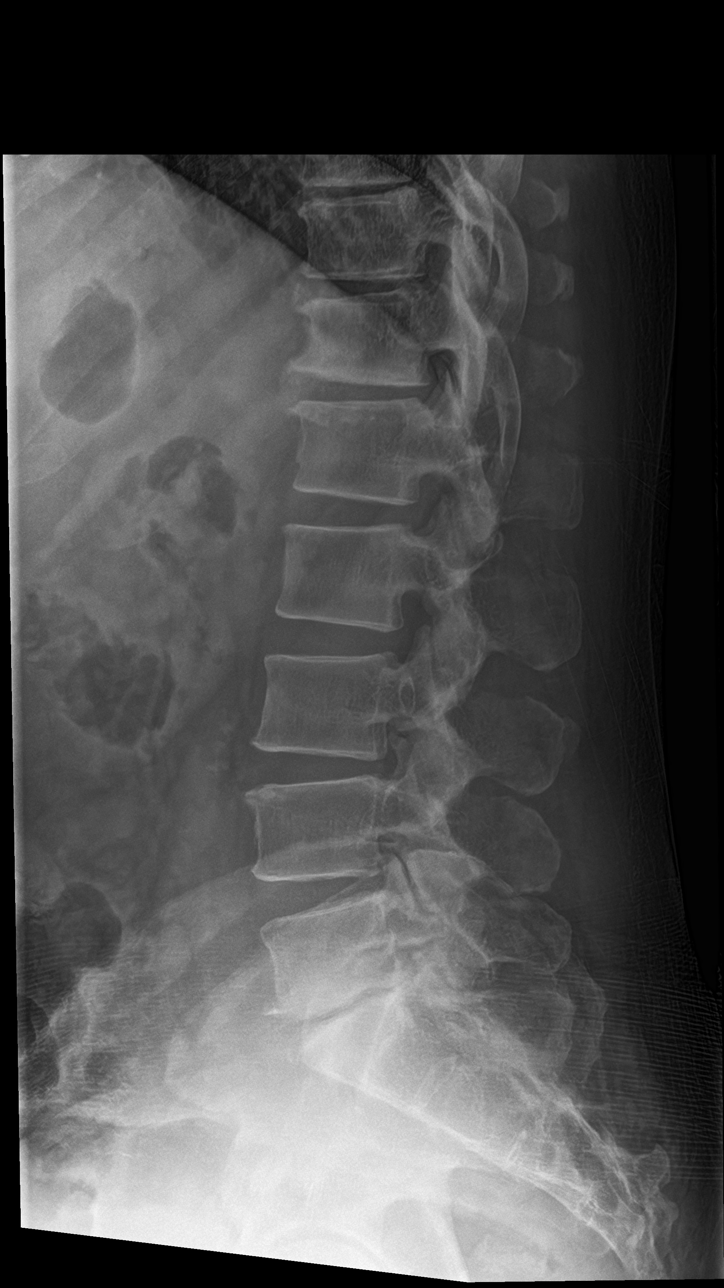

[l-spine spot]
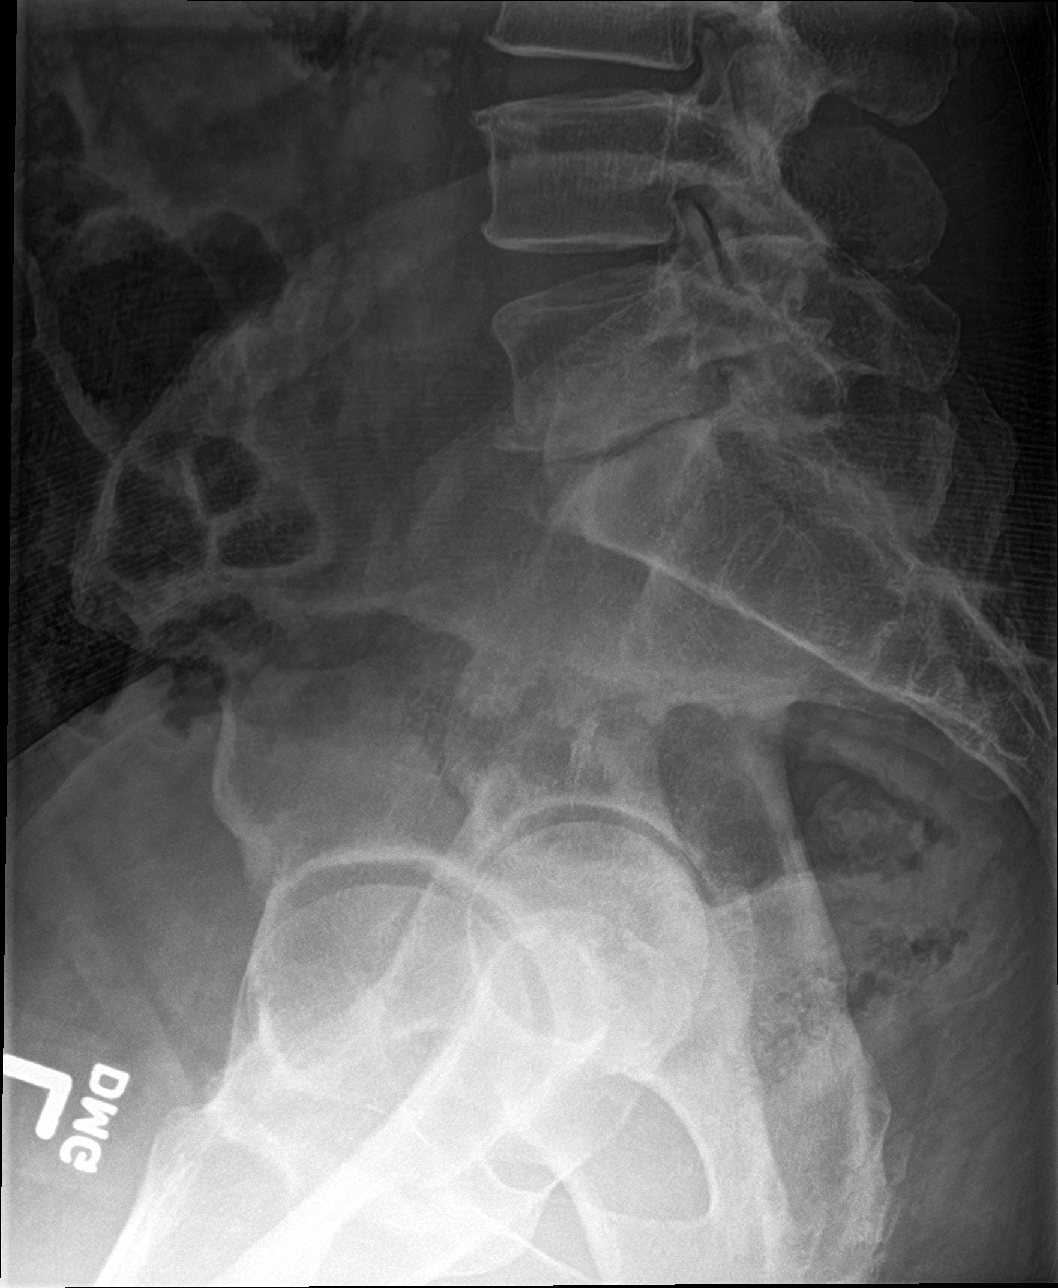

[5 of 5 positions shown; findings below may reference images not displayed]

FINDINGS: Five non-rib-bearing lumbar vertebra.

Osseous mineralization normal.

Disc space narrowing L5-S1 with small endplate spurs and endplate
sclerosis.

Vertebral body and disc space heights otherwise maintained.

Small endplate spurs superior L4 as well as at T12-L1.

No fracture, subluxation, or bone destruction.

No spondylolysis.

SI joints preserved.
IMPRESSION: Mild degenerative disc disease changes lumbar spine.

No acute abnormalities.

## 2020-03-07 MED ORDER — PREDNISONE 10 MG (21) PO TBPK
ORAL_TABLET | Freq: Every day | ORAL | 0 refills | Status: DC
Start: 2020-03-07 — End: 2021-11-02

## 2020-03-07 MED ORDER — CYCLOBENZAPRINE HCL 10 MG PO TABS
10.0000 mg | ORAL_TABLET | Freq: Two times a day (BID) | ORAL | 0 refills | Status: DC | PRN
Start: 2020-03-07 — End: 2021-11-02

## 2020-03-07 MED ORDER — OXYCODONE-ACETAMINOPHEN 5-325 MG PO TABS
1.0000 | ORAL_TABLET | Freq: Once | ORAL | Status: AC
  Administered 2020-03-07: 1 via ORAL
  Filled 2020-03-07: qty 1

## 2020-03-07 MED ORDER — IBUPROFEN 600 MG PO TABS: 600.0000 mg | ORAL_TABLET | Freq: Four times a day (QID) | ORAL | 0 refills | Status: AC | PRN

## 2020-03-07 MED ORDER — CYCLOBENZAPRINE HCL 5 MG PO TABS
5.0000 mg | ORAL_TABLET | Freq: Once | ORAL | Status: AC
  Administered 2020-03-07: 5 mg via ORAL
  Filled 2020-03-07: qty 1

## 2020-03-07 NOTE — ED Triage Notes (Addendum)
Pt reports low back and hip pain x 1-2 weeks. Denies injury. States he was seen here 1 month ago for same. Reports fall this morning "my leg gave out". Denies incontinence but states "almost"

## 2020-03-07 NOTE — ED Provider Notes (Signed)
[facility] EMERGENCY DEPARTMENT Provider Note   CSN: [id number] Arrival date & time: 03/07/20  1403     History Chief Complaint  Patient presents with  . Back Pain    Terry Mckinney is a 60 y.o. male.  The history is provided by the patient. No language interpreter was used.  Back Pain Associated symptoms: no fever and no numbness      60 year old male presenting for evaluation of back pain.  Patient report for the past 2 weeks he has had recurrent pain to his lower back that radiates down his left leg.  Pain is described as a sharp shooting throbbing sensation, waxing waning, initially radiates down to his left thigh but now down to his entire leg.  This morning he got up and his leg immediately gave out.  Moderate pain at this time.  No associated fever or chills no bowel bladder incontinence or saddle anesthesia no rash no recent injury.  Was seen in the ED for similar complaint couple weeks ago and did receive the medication that did provide some relief but pain intensified today.  No history of IV drug use or active cancer.  Past Medical History:  Diagnosis Date  . Phobia     There are no problems to display for this patient.   Past Surgical History:  Procedure Laterality Date  . ABDOMINAL SURGERY    . I & D EXTREMITY Right 11/07/2013   Procedure: IRRIGATION AND DEBRIDEMENT EXTREMITY;  Surgeon: Betha Loa, MD;  Location: [facility] OR;  Service: Orthopedics;  Laterality: Right;  . KNEE SURGERY         Family History  Problem Relation Age of Onset  . Diabetes Other   . Hypertension Other   . Diabetes Mother     Social History   Tobacco Use  . Smoking status: Current Every Day Smoker    Packs/day: 1.00    Types: Cigars  . Smokeless tobacco: Never Used  Vaping Use  . Vaping Use: Never used  Substance Use Topics  . Alcohol use: Yes    Comment: socially  . Drug use: No    Home Medications Prior to Admission medications   Medication Sig Start Date  End Date Taking? Authorizing Provider  cyclobenzaprine (FLEXERIL) 10 MG tablet Take 1 tablet (10 mg total) by mouth 2 (two) times daily as needed for muscle spasms. 01/15/20   Sabas Sous, MD  erythromycin ophthalmic ointment Place a 1/2 inch ribbon of ointment into the lower eyelid. Patient not taking: No sig reported 03/27/16   Demetrios Loll T, PA-C  HYDROcodone-acetaminophen (NORCO/VICODIN) 5-325 MG tablet Take 2 tablets by mouth every 4 (four) hours as needed. Patient not taking: No sig reported 03/27/16   Demetrios Loll T, PA-C  ibuprofen (ADVIL,MOTRIN) 200 MG tablet Take 200 mg by mouth daily as needed for mild pain.    [provider]  lidocaine (LIDODERM) 5 % Place 1 patch onto the skin daily. Remove & Discard patch within 12 hours or as directed by MD 01/15/20   Sabas Sous, MD  Liniments (BEN GAY EX) Apply 1 application topically daily as needed (muscle aches).    [provider]  predniSONE (STERAPRED UNI-PAK 21 TAB) 10 MG (21) TBPK tablet Take by mouth daily. Take 6 tablets on day 1, 5 tablets on day 2, 4 tablets on day 3, 3 tablets on day 4, 2 tablets on day 5, and 1 tablet on day 6 06/28/18   Luevenia Maxin, Marlynn Perking,  PA-C    Allergies    Naproxen  Review of Systems   Review of Systems  Constitutional: Negative for fever.  Musculoskeletal: Positive for back pain.  Skin: Negative for wound.  Neurological: Negative for numbness.    Physical Exam Updated Vital Signs BP (!) 144/99 (BP Location: Left Arm)   Pulse 71   Temp 98 F (36.7 C) (Oral)   Resp 18   Ht 5\' 9"  (1.753 m)   Wt 86.2 kg   SpO2 99%   BMI 28.06 kg/m   Physical Exam Vitals and nursing note reviewed.  Constitutional:      General: He is not in acute distress.    Appearance: He is well-developed and well-nourished.  HENT:     Head: Atraumatic.  Eyes:     Conjunctiva/sclera: Conjunctivae normal.  Abdominal:     Palpations: Abdomen is soft.  Musculoskeletal:        General:  Tenderness (Tenderness to left lumbosacral region with positive left straight leg raise.  Walks with a limp.) present.     Cervical back: Neck supple.     Comments: Mild lumbar spine tenderness no crepitus step-off noted.  Skin:    Findings: No rash.  Neurological:     Mental Status: He is alert.     Comments: Patellar deep tendon reflex intact bilaterally, no foot drop  Psychiatric:        Mood and Affect: Mood and affect normal.     ED Results / Procedures / Treatments   Labs (all labs ordered are listed, but only abnormal results are displayed) Labs Reviewed - No data to display  EKG None  Radiology No results found.  Procedures Procedures   Medications Ordered in ED Medications  oxyCODONE-acetaminophen (PERCOCET/ROXICET) 5-325 MG per tablet 1 tablet (1 tablet Oral Given 03/07/20 1633)  cyclobenzaprine (FLEXERIL) tablet 5 mg (5 mg Oral Given 03/07/20 1633)    ED Course  I have reviewed the triage vital signs and the nursing notes.  Pertinent labs & imaging results that were available during my care of the patient were reviewed by me and considered in my medical decision making (see chart for details).    MDM Rules/Calculators/A&P                          BP (!) 153/99 (BP Location: Right Arm)   Pulse 60   Temp 98 F (36.7 C) (Oral)   Resp 18   Ht 5\' 9"  (1.753 m)   Wt 86.2 kg   SpO2 100%   BMI 28.06 kg/m   Final Clinical Impression(s) / ED Diagnoses Final diagnoses:  Sciatica, left side    Rx / DC Orders ED Discharge Orders         Ordered    cyclobenzaprine (FLEXERIL) 10 MG tablet  2 times daily PRN        03/07/20 1611    predniSONE (STERAPRED UNI-PAK 21 TAB) 10 MG (21) TBPK tablet  Daily        03/07/20 1611    ibuprofen (ADVIL) 600 MG tablet  Every 6 hours PRN        03/07/20 1611         3:30 PM Patient here with radicular leg pain suggestive of left-sided sciatica.  No red flags.  This is his second ER visit for same we will obtain x-ray  of lumbar spine.  5:01 PM L-spine x-ray demonstrate mild degenerative disc disease without any  acute abnormalities.  Will treat patient for sciatica.  Return precaution given.  Patient able to ambulate.  No red flags.   Fayrene Helper, PA-C 03/07/20 1704    Long, Arlyss Repress, MD 03/08/20 [phone]

## 2020-03-07 NOTE — ED Notes (Signed)
Patient transported to X-ray 

## 2020-03-15 ENCOUNTER — Emergency Department (HOSPITAL_COMMUNITY): Payer: BC Managed Care – PPO

## 2020-03-15 ENCOUNTER — Encounter (HOSPITAL_COMMUNITY): Payer: Self-pay | Admitting: *Deleted

## 2020-03-15 ENCOUNTER — Other Ambulatory Visit: Payer: Self-pay

## 2020-03-15 ENCOUNTER — Emergency Department (HOSPITAL_COMMUNITY)
Admission: EM | Admit: 2020-03-15 | Discharge: 2020-03-16 | Disposition: A | Discharge status: Home / Self Care | Payer: BC Managed Care – PPO | Attending: Emergency Medicine | Admitting: Emergency Medicine

## 2020-03-15 DIAGNOSIS — G8929 Other chronic pain: Secondary | ICD-10-CM | POA: Diagnosis not present

## 2020-03-15 DIAGNOSIS — F17290 Nicotine dependence, other tobacco product, uncomplicated: Secondary | ICD-10-CM | POA: Diagnosis not present

## 2020-03-15 DIAGNOSIS — G5622 Lesion of ulnar nerve, left upper limb: Secondary | ICD-10-CM | POA: Diagnosis not present

## 2020-03-15 DIAGNOSIS — R202 Paresthesia of skin: Secondary | ICD-10-CM

## 2020-03-15 DIAGNOSIS — M545 Low back pain, unspecified: Secondary | ICD-10-CM

## 2020-03-15 DIAGNOSIS — M5442 Lumbago with sciatica, left side: Secondary | ICD-10-CM | POA: Diagnosis not present

## 2020-03-15 LAB — CBC WITH DIFFERENTIAL/PLATELET
Abs Immature Granulocytes: 0.07 10*3/uL (ref 0.00–0.07)
Basophils Absolute: 0 10*3/uL (ref 0.0–0.1)
Basophils Relative: 1 %
Eosinophils Absolute: 0.2 10*3/uL (ref 0.0–0.5)
Eosinophils Relative: 2 %
HCT: 47.4 % (ref 39.0–52.0)
Hemoglobin: 16.7 g/dL (ref 13.0–17.0)
Immature Granulocytes: 1 %
Lymphocytes Relative: 27 %
Lymphs Abs: 2.3 10*3/uL (ref 0.7–4.0)
MCH: 29 pg (ref 26.0–34.0)
MCHC: 35.2 g/dL (ref 30.0–36.0)
MCV: 82.3 fL (ref 80.0–100.0)
Monocytes Absolute: 0.5 10*3/uL (ref 0.1–1.0)
Monocytes Relative: 6 %
Neutro Abs: 5.7 10*3/uL (ref 1.7–7.7)
Neutrophils Relative %: 63 %
Platelets: 189 10*3/uL (ref 150–400)
RBC: 5.76 MIL/uL (ref 4.22–5.81)
RDW: 14.3 % (ref 11.5–15.5)
WBC: 8.8 10*3/uL (ref 4.0–10.5)
nRBC: 0 % (ref 0.0–0.2)

## 2020-03-15 LAB — BASIC METABOLIC PANEL
Anion gap: 11 (ref 5–15)
BUN: 9 mg/dL (ref 6–20)
CO2: 23 mmol/L (ref 22–32)
Calcium: 8.4 mg/dL — ABNORMAL LOW (ref 8.9–10.3)
Chloride: 101 mmol/L (ref 98–111)
Creatinine, Ser: 0.94 mg/dL (ref 0.61–1.24)
GFR, Estimated: >60 mL/min (ref >60)
Glucose, Bld: 203 mg/dL — ABNORMAL HIGH (ref 70–99)
Potassium: 3.7 mmol/L (ref 3.5–5.1)
Sodium: 135 mmol/L (ref 135–145)

## 2020-03-15 IMAGING — MR MR LUMBAR SPINE W/O CM
4 of 5 series · 27 of 48 positions shown · non-contrast
Comparison: Radiography [DATE]

CLINICAL DATA: Progressive low back pain.

EXAM:
MRI LUMBAR SPINE WITHOUT CONTRAST
TECHNIQUE: Multiplanar, multisequence MR imaging of the lumbar spine was
performed. No intravenous contrast was administered.

[Series 5: T2 · sagittal · 4.0mm · 0.73mm/px · 6 of 16 slices shown (1 of 2)]
[im 1/16]
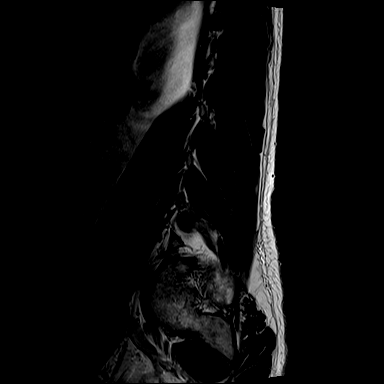
[im 4/16]
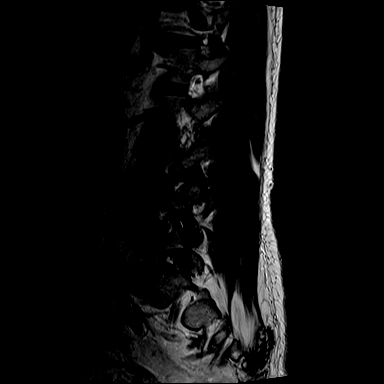
[im 7/16]
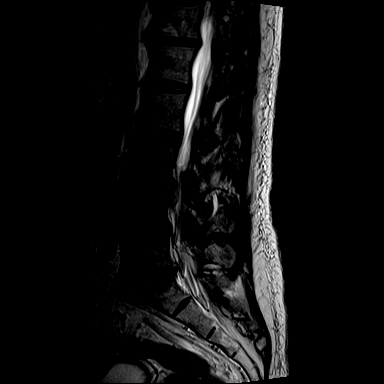
[im 10/16]
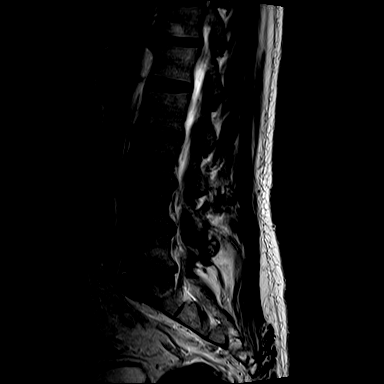
[im 13/16]
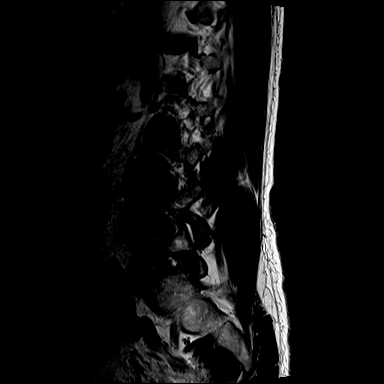
[im 16/16]
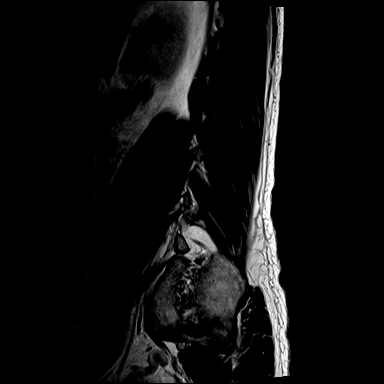

[Series 7: T1 · sagittal · 4.0mm · 0.88mm/px · 7 of 16 slices shown (1 of 2)]
[im 1/16]
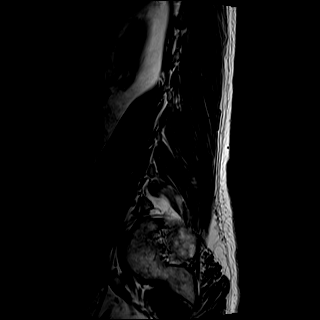
[im 3/16]
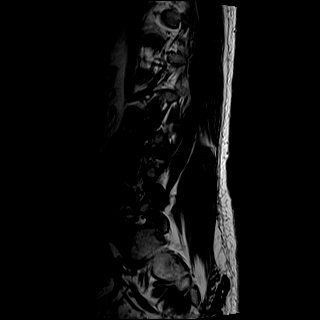
[im 6/16]
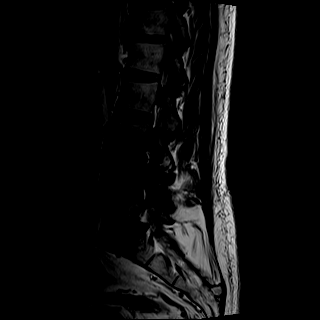
[im 8/16]
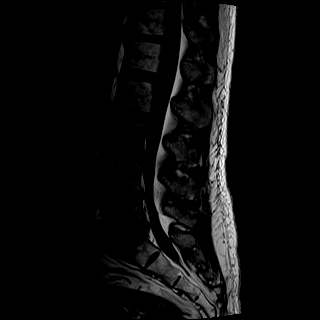
[im 11/16]
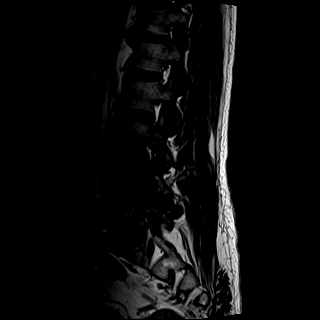
[im 13/16]
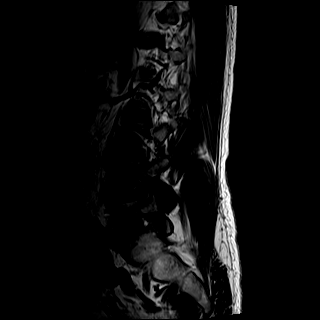
[im 16/16]
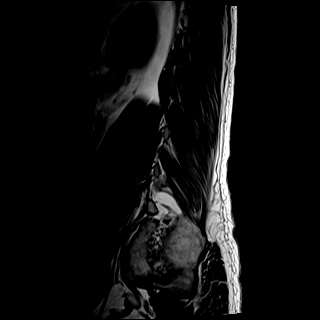

[Series 8: T2 · axial · 4.0mm · 0.57mm/px · z∈[-13,+188]mm · 8 of 34 slices shown (2 of 2)]
[im 1/34]
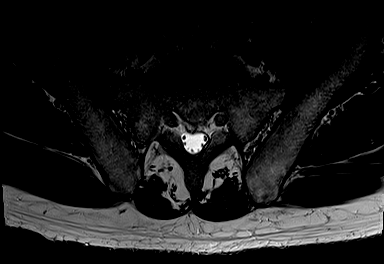
[im 6/34]
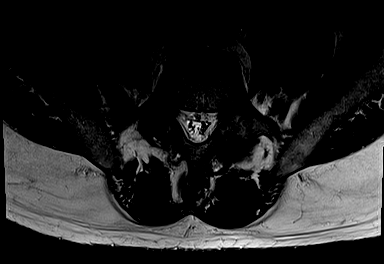
[im 11/34]
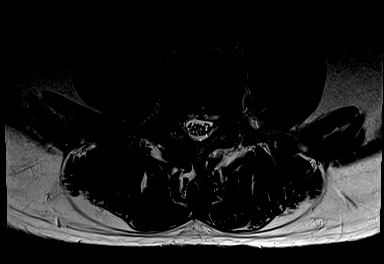
[im 16/34]
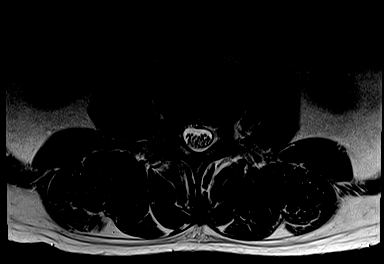
[im 18/34]
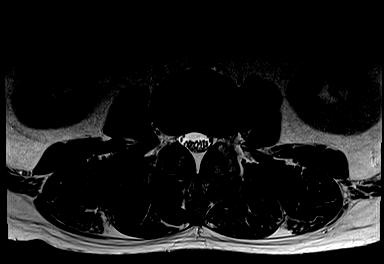
[im 23/34]
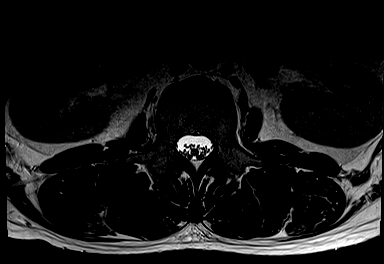
[im 28/34]
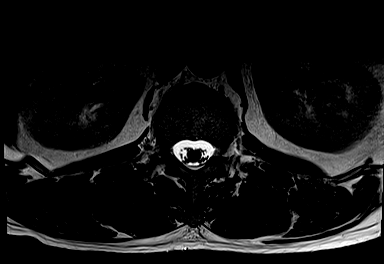
[im 34/34]
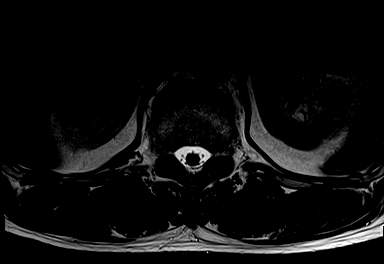

[Series 9: T1 · axial · 4.0mm · 0.34mm/px · z∈[-16,+160]mm · 6 of 34 slices shown (2 of 2)]
[im 1/34]
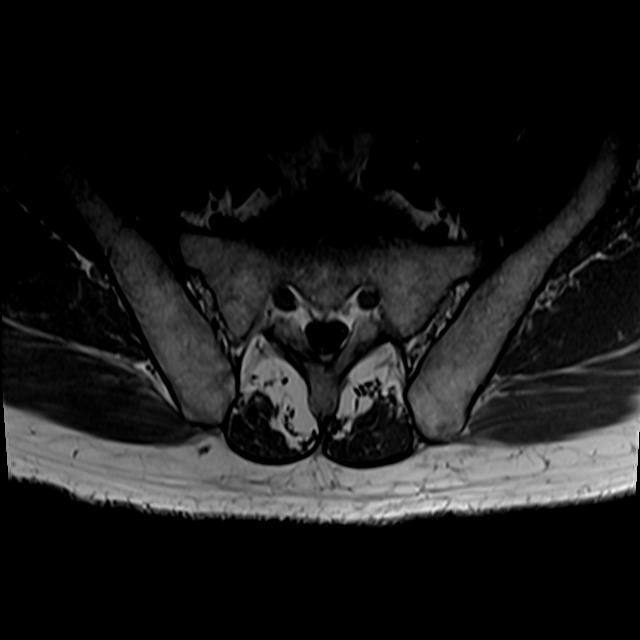
[im 6/34]
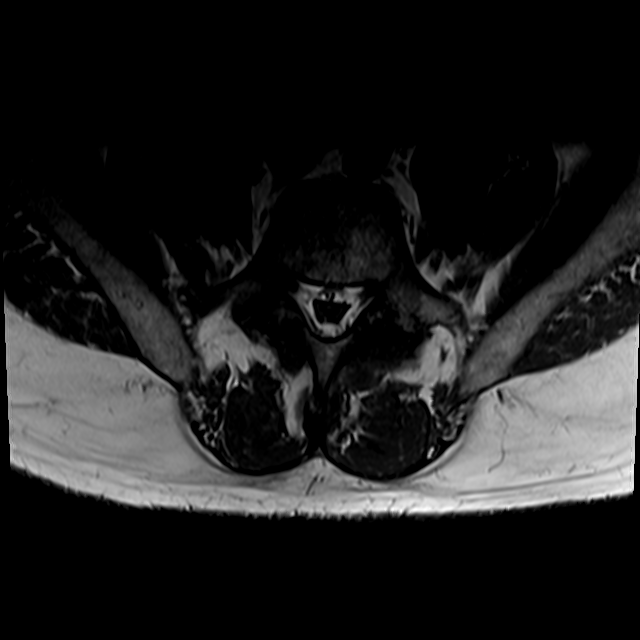
[im 11/34]
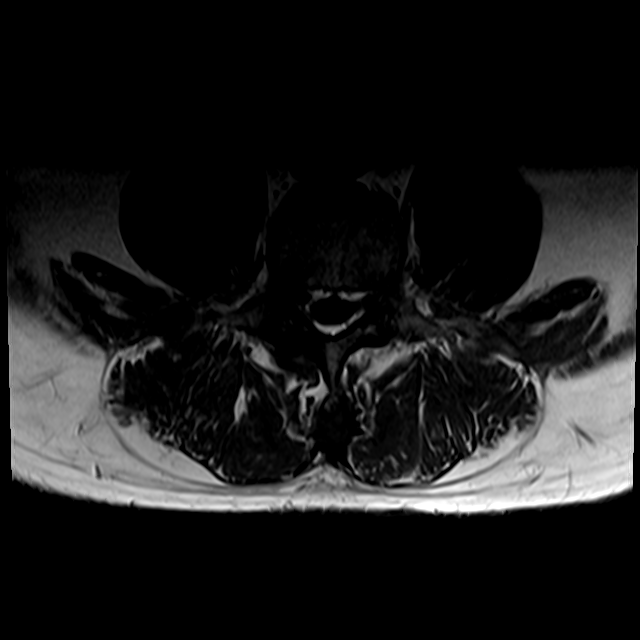
[im 16/34]
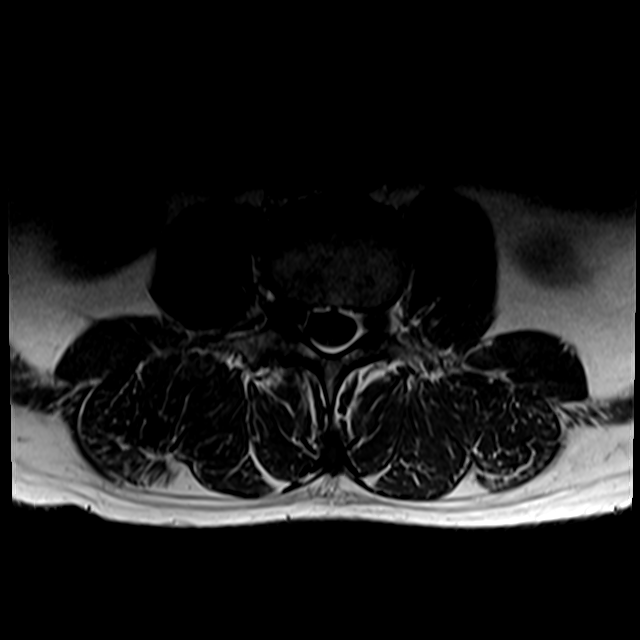
[im 18/34]
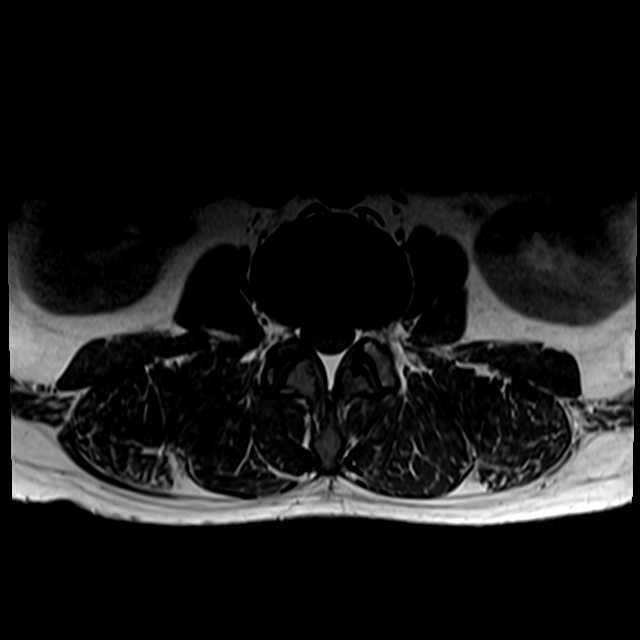
[im 28/34]
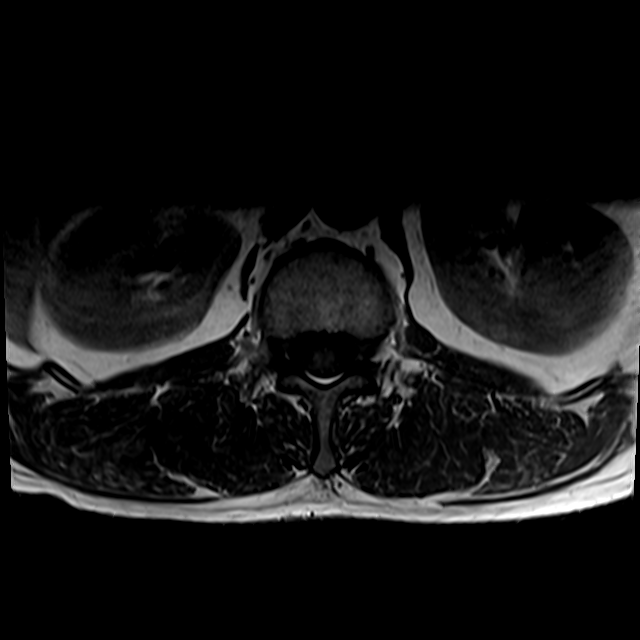

[27 of 48 positions shown; findings below may reference images not displayed]

FINDINGS: Segmentation:  5 lumbar type vertebral bodies.

Alignment:  Normal

Vertebrae: No fracture or primary bone lesion. Chronic discogenic
endplate marrow changes at L5-S1, which could be associated with low
back pain.

Conus medullaris and cauda equina: Conus extends to the L1-2 level.
Conus and cauda equina appear normal.

Paraspinal and other soft tissues: Negative

Disc levels:

T12-L1: Desiccation and mild bulging of the disc. No compressive
stenosis.

L1-2 and L2-3: Normal

L3-4: Bulging of the disc. Facet and ligamentous hypertrophy. Facet
arthropathy much worse on the right, with foraminal encroachment
that could compress the right L3 nerve. There is edema of this joint
which could be associated with back pain or referred facet syndrome
pain as well.

L4-5: Desiccation and bulging of the disc. Shallow left foraminal
disc herniation. Bulging disc material also encroaches somewhat upon
the foramen on the right. Bilateral facet arthropathy with
hypertrophy, joint effusions and edema. Foraminal stenosis left
worse than right could compress either or both L4 nerves, more
likely the left. Facet arthropathy would likely be a cause of back
pain or referred facet syndrome pain.

L5-S1: Chronic disc degeneration with loss of disc height. Endplate
osteophytes and bulging of the disc. No central canal stenosis.
Foraminal narrowing on both sides with some potential to affect
either or both L5 nerves. Bilateral facet osteoarthritis could
contribute to low back pain.
IMPRESSION: L3-4: Disc bulge. Asymmetric facet arthropathy on the right. Right
foraminal stenosis that could compress the right L3 nerve. Edematous
change of the facets, right more than left, which could contribute
to back pain or referred facet syndrome pain.

L4-5: Disc bulging with foraminal protrusions, larger on the left
than the right. Bilateral facet arthropathy. Either L4 nerve could
be compressed, more likely the left. The facet arthropathy shows
pronounced edema and would likely be associated with back pain or
referred facet syndrome pain.

L5-S1: Chronic disc degeneration. Chronic endplate marrow changes
which could be associated with low back pain. Bilateral foraminal
encroachment by osteophyte and bulging disc material could possibly
affect either L5 nerve. The facet arthropathy, though not as
advanced as the levels above, could contribute to low back pain.

## 2020-03-15 IMAGING — MR MR HEAD W/O CM
8 series · 48 of 48 positions shown · non-contrast
Comparison: [DATE] and prior.

CLINICAL DATA: Neuro deficit, acute, stroke suspected L-sided
paresthesia

EXAM:
MRI HEAD WITHOUT CONTRAST
TECHNIQUE: Multiplanar, multiecho pulse sequences of the brain and surrounding
structures were obtained without intravenous contrast.

[Series 5: DWI · axial · 3.0mm · 0.88mm/px · z∈[-96,+54]mm · 13 of 102 slices shown (1 of 4)]
[im 1/102]
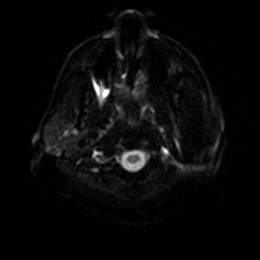
[im 9/102]
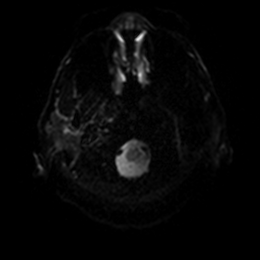
[im 17/102]
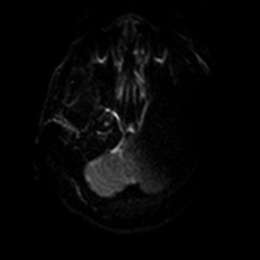
[im 26/102]
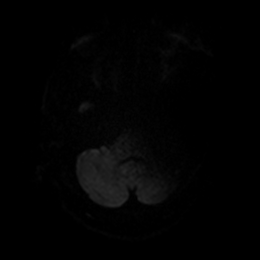
[im 34/102]
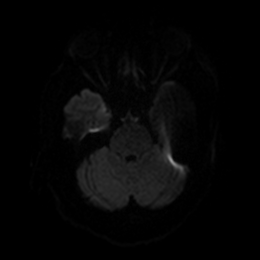
[im 43/102]
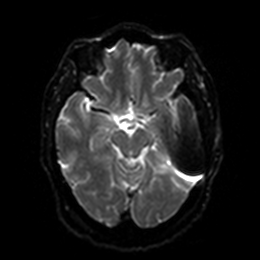
[im 51/102]
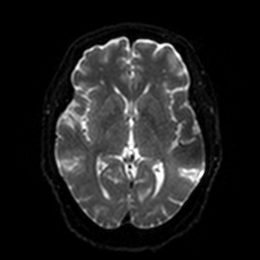
[im 59/102]
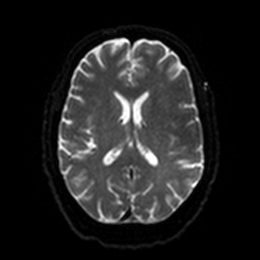
[im 68/102]
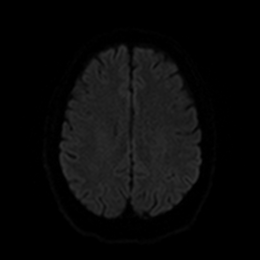
[im 76/102]
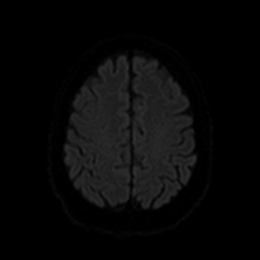
[im 85/102]
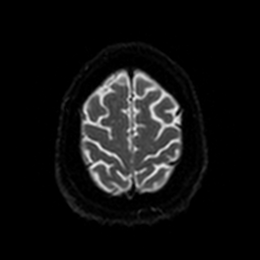
[im 93/102]
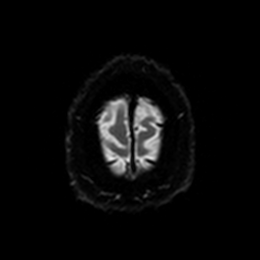
[im 102/102]
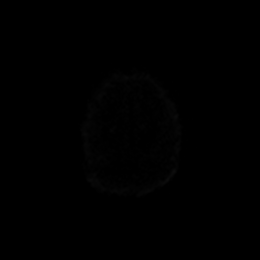

[Series 6: DWI · axial · 3.0mm · 0.88mm/px · z∈[-96,+54]mm · 7 of 51 slices shown (2 of 4)]
[im 1/51]
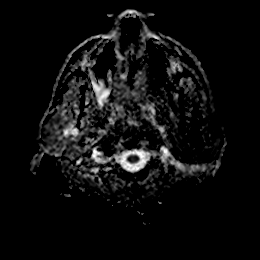
[im 9/51]
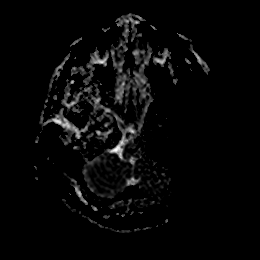
[im 17/51]
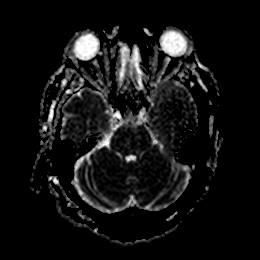
[im 26/51]
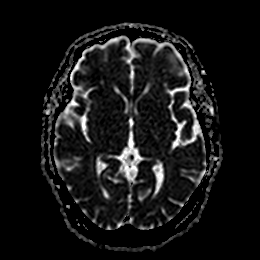
[im 34/51]
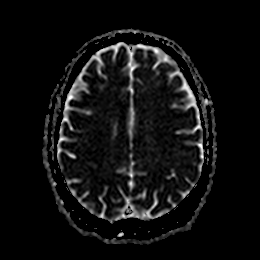
[im 42/51]
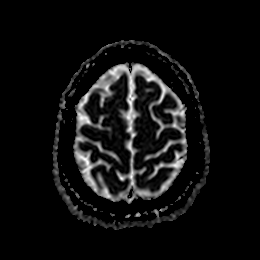
[im 51/51]
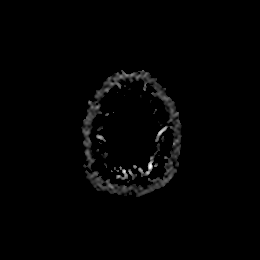

[Series 7: DWI · coronal · 4.0mm · 0.88mm/px · 9 of 70 slices shown (3 of 4)]
[im 1/70]
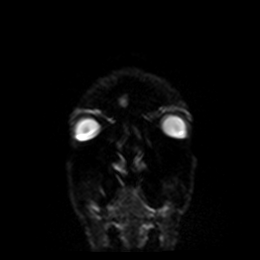
[im 9/70]
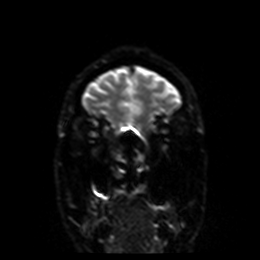
[im 18/70]
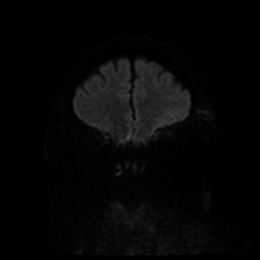
[im 26/70]
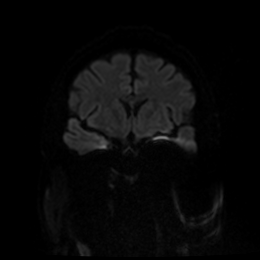
[im 35/70]
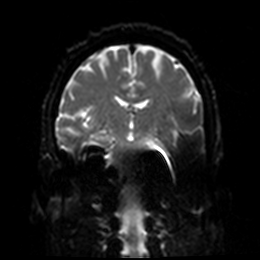
[im 44/70]
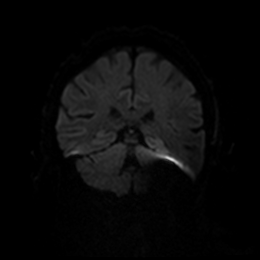
[im 52/70]
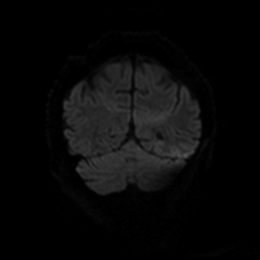
[im 61/70]
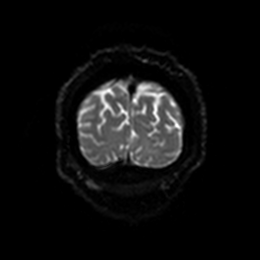
[im 70/70]
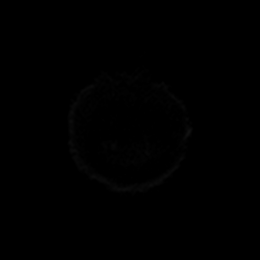

[Series 8: DWI · coronal · 4.0mm · 0.88mm/px · 5 of 35 slices shown (4 of 4)]
[im 1/35]
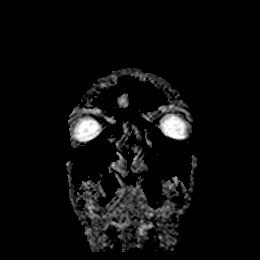
[im 9/35]
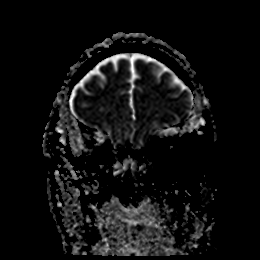
[im 18/35]
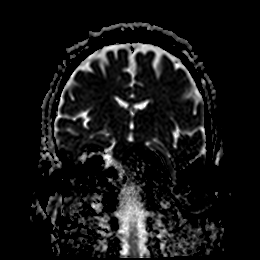
[im 26/35]
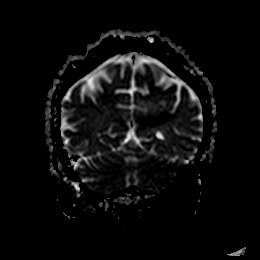
[im 35/35]
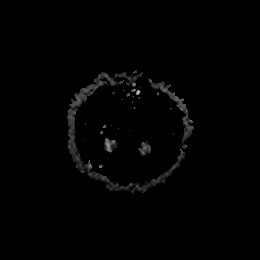

[Series 9: T1 · sagittal · 5.0mm · 0.75mm/px · 3 of 25 slices shown]
[im 1/25]
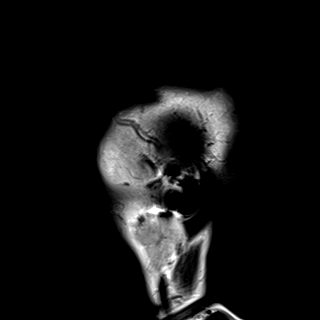
[im 13/25]
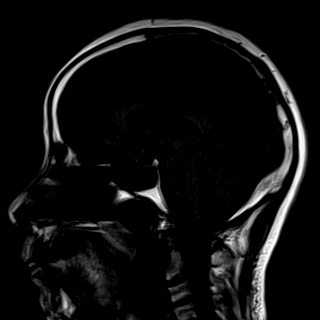
[im 25/25]
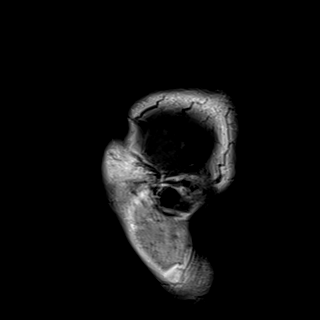

[Series 10: T2 · axial · 5.0mm · 0.72mm/px · z∈[-93,+62]mm · 4 of 27 slices shown]
[im 1/27]
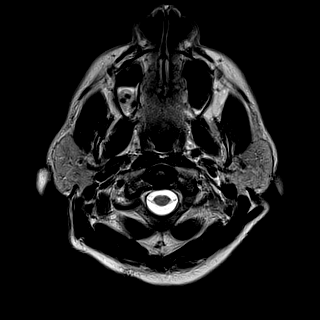
[im 9/27]
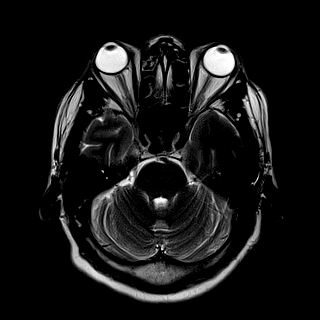
[im 18/27]
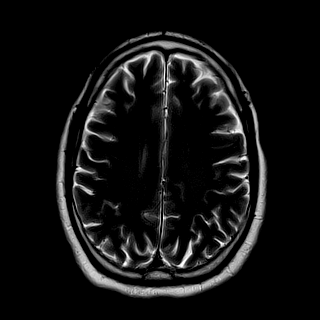
[im 27/27]
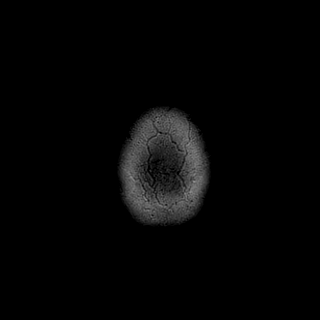

[Series 11: FLAIR · axial · 5.0mm · 0.90mm/px · z∈[-93,+62]mm · 4 of 27 slices shown]
[im 1/27]
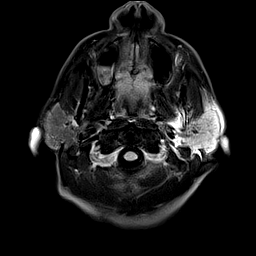
[im 9/27]
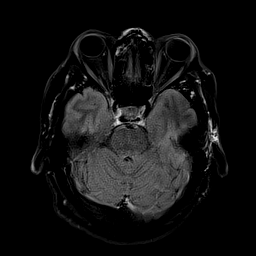
[im 18/27]
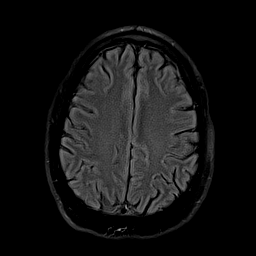
[im 27/27]
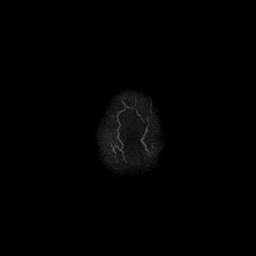

[Series 12: ax hemo · axial · 5.0mm · 0.86mm/px · z∈[-88,+55]mm · 3 of 25 slices shown]
[im 1/25]
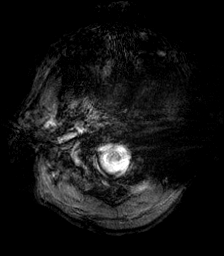
[im 13/25]
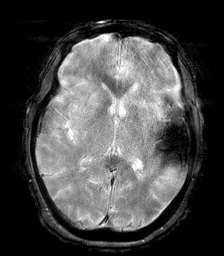
[im 25/25]
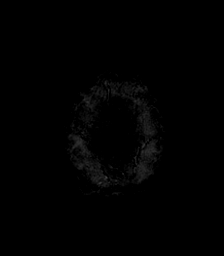

[48 of 48 positions shown; findings below may reference images not displayed]

FINDINGS: Examination was terminated early secondary to patient disposition.
Please note limited sequence acquisition limits evaluation.

Brain: No diffusion-weighted signal abnormality. No intracranial
hemorrhage. No midline shift, ventriculomegaly or extra-axial fluid
collection. No mass lesion. Minimal chronic microvascular ischemic
changes.

Vascular: Please see recent CTA.

Skull and upper cervical spine: Normal marrow signal.

Sinuses/Orbits: Normal orbits. Right maxillary sinus mucous
retention cyst. No mastoid effusion.

Other: Left mastoid susceptibility artifact.
IMPRESSION: No acute intracranial process.

Minimal chronic microvascular ischemic changes.

## 2020-03-15 IMAGING — CT CT ANGIO NECK
2 of 11 series · 7 of 33 positions shown · IV contrast (APPLIED)
Comparison: [DATE].

CLINICAL DATA: Neuro deficit, acute, stroke suspected L-sided
numbness



[Series 9: cta neck/head · axial · 0.52mm/px · z∈[-232,-106]mm · 2 of 191 slices shown]
[im 64/191  soft-tissue]
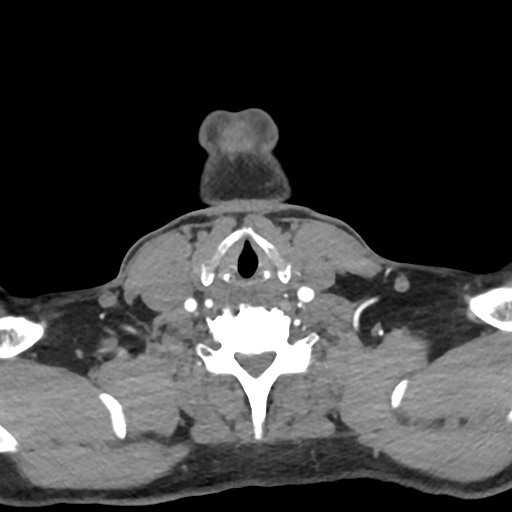
[im 127/191  soft-tissue]
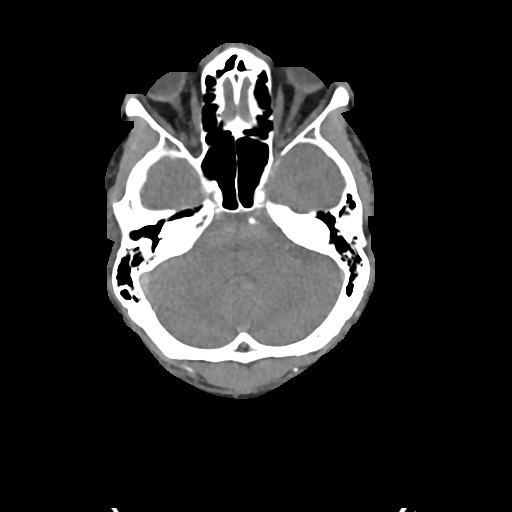

[Series 11: ax thins · axial · 0.39mm/px · z∈[-295,-43]mm · 5 of 379 slices shown]
[im 64/379  soft-tissue]
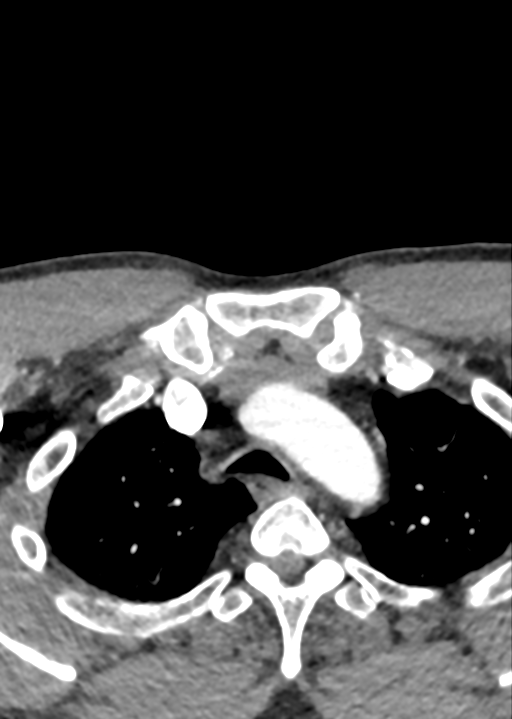
[im 127/379  bone]
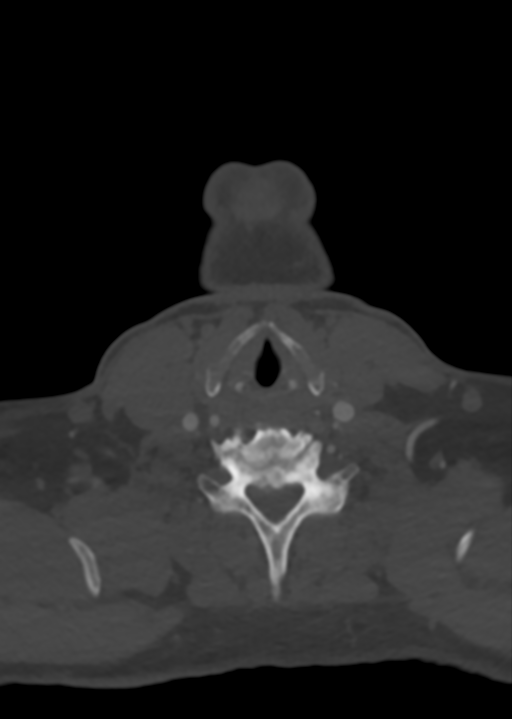
[im 190/379  soft-tissue]
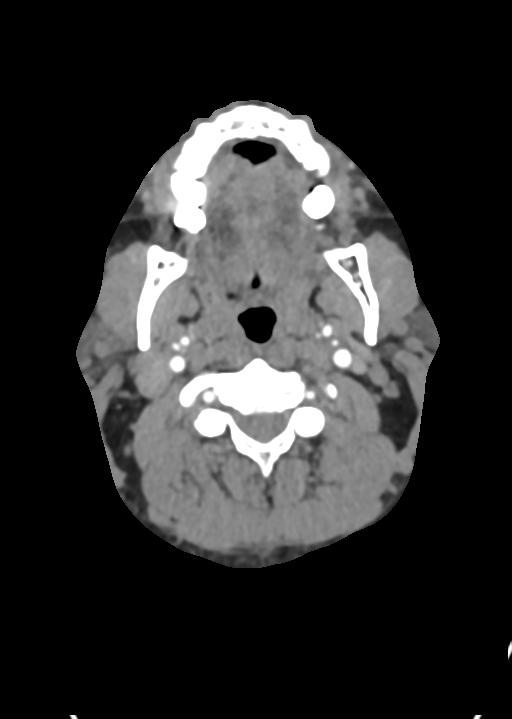
[im 253/379  bone]
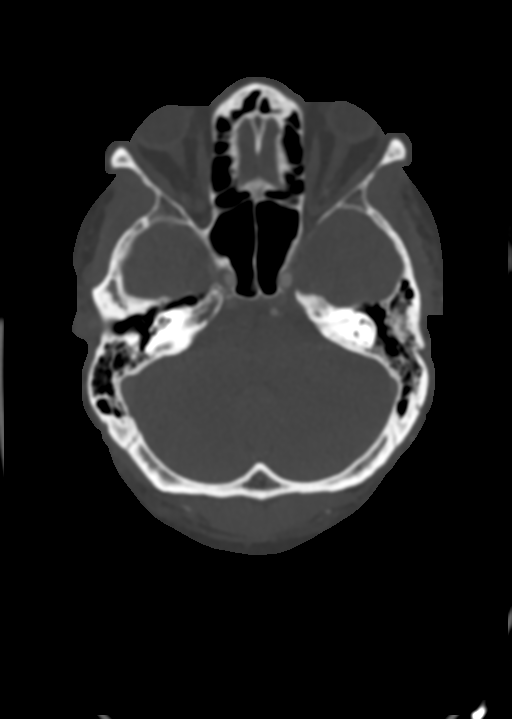
[im 316/379  soft-tissue]
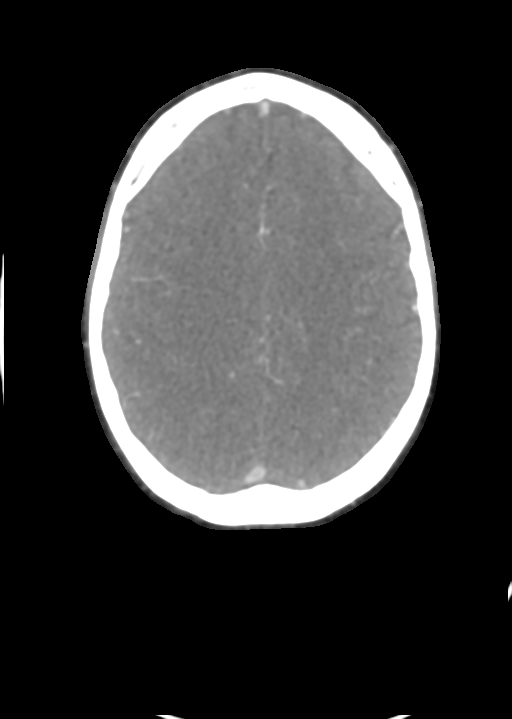

[7 of 33 positions shown; findings below may reference images not displayed]

FINDINGS: CT HEAD FINDINGS

Brain: No acute infarct or intracranial hemorrhage. No mass lesion.
No midline shift, ventriculomegaly or extra-axial fluid collection.

Vascular: No hyperdense vessel or unexpected calcification.

Skull: Negative for fracture or focal lesion.

Sinuses/Orbits: Normal orbits. Small right sphenoid sinus mucous
retention cyst. No mastoid effusion.

Other: None.

Review of the MIP images confirms the above findings

CTA NECK FINDINGS

Aortic arch: Standard branching. Imaged portion shows no evidence of
aneurysm or dissection. No significant stenosis of the major arch
vessel origins.

Right carotid system: No evidence of dissection, stenosis (50% or
greater) or occlusion.

Left carotid system: No evidence of dissection, stenosis (50% or
greater) or occlusion.

Vertebral arteries: Dominant right vertebral artery. No evidence of
dissection, stenosis (50% or greater) or occlusion.

Skeleton: No acute finding.  Multilevel spondylosis.

Other neck: No adenopathy.  No soft tissue mass.

Upper chest: Upper lung atelectasis.

Review of the MIP images confirms the above findings

CTA HEAD FINDINGS

Anterior circulation: Bilateral carotid siphon atherosclerotic
calcifications. Mild right supraclinoid ICA narrowing. Right A1
segment hypoplasia. Patent ACAs. Patent MCAs.

Posterior circulation: Dominant right vertebral artery. Patent V4
segments. Right V4 segment atherosclerotic calcifications with mild
narrowing. Patent proximal PICA. Patent basilar and superior
cerebellar arteries. Patent bilateral PCAs with mild multifocal
narrowing on the right.

Venous sinuses: As permitted by contrast timing, patent.

Anatomic variants: Please see above.

Review of the MIP images confirms the above findings
IMPRESSION: Head CT:

No acute intracranial process.

CTA neck:

No emergent vascular finding within the neck.

CT head:

No emergent vascular finding within the head.

Mild right supraclinoid ICA and right PCA narrowing.

## 2020-03-15 MED ORDER — LORAZEPAM 2 MG/ML IJ SOLN
1.0000 mg | Freq: Once | INTRAMUSCULAR | Status: AC
  Administered 2020-03-15: 1 mg via INTRAVENOUS
  Filled 2020-03-15: qty 1

## 2020-03-15 MED ORDER — LORAZEPAM 2 MG/ML IJ SOLN
2.0000 mg | Freq: Once | INTRAMUSCULAR | Status: AC | PRN
  Administered 2020-03-15: 2 mg via INTRAVENOUS
  Filled 2020-03-15: qty 1

## 2020-03-15 MED ORDER — IOHEXOL 350 MG/ML SOLN
80.0000 mL | Freq: Once | INTRAVENOUS | Status: AC | PRN
  Administered 2020-03-15: 80 mL via INTRAVENOUS

## 2020-03-15 MED ORDER — DIPHENHYDRAMINE HCL 50 MG/ML IJ SOLN
25.0000 mg | Freq: Once | INTRAMUSCULAR | Status: AC
  Administered 2020-03-15: 25 mg via INTRAVENOUS
  Filled 2020-03-15: qty 1

## 2020-03-15 NOTE — ED Triage Notes (Signed)
Patient states he was given nabumetone for back pain on Thurs , just got the medicine filled today and after taking one this am c/o numbness in his mouth no difficulty with breathing or swallowing.

## 2020-03-15 NOTE — ED Provider Notes (Incomplete)
[facility] EMERGENCY DEPARTMENT Provider Note   CSN: [id number] Arrival date & time: 03/15/20  1431     History Chief Complaint  Patient presents with  . Allergic Reaction    Voshon Petro is a 60 y.o. male w/ h/o tobacco use who presents to the ED for possible allergic reaction. Patient prescribed nabumetone today for lower back pain he has been having for the last 2 months. 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 Shortly after taking first dose of nabumetone, patient developed perioral paresthesias as well as numbness across the left side of his body as well as L arm and L leg. Patient also having ongoing L low back pain that radiates down his L leg. Wife reports him having increasing difficulty walking over the last several days as well. Denies SOB, wheezing, GI upset, throat or facial swelling, urticaria, or rash. He did not try taking anything for his symptoms at home. Denies headache, vision changes, hearing loss, neck pain, dizziness/lightheaded, or weakness.  The history is provided by the patient, the spouse and medical records.  Neurologic Problem This is a new problem. The current episode started 6 to 12 hours ago. The problem occurs constantly. The problem has not changed since onset.Pertinent negatives include no chest pain, no abdominal pain, no headaches and no shortness of breath. Nothing aggravates the symptoms. Nothing relieves the symptoms. He has tried nothing for the symptoms.       Past Medical History:  Diagnosis Date  . Phobia     There are no problems to display for this patient.   Past Surgical History:  Procedure Laterality Date  . ABDOMINAL SURGERY    . I & D EXTREMITY Right 11/07/2013   Procedure: IRRIGATION AND DEBRIDEMENT EXTREMITY;  Surgeon: Betha Loa, MD;  Location: [facility] OR;  Service: Orthopedics;  Laterality: Right;  . KNEE SURGERY         Family History  Problem Relation Age of Onset  . Diabetes Other   . Hypertension  Other   . Diabetes Mother     Social History   Tobacco Use  . Smoking status: Current Every Day Smoker    Packs/day: 1.00    Types: Cigars  . Smokeless tobacco: Never Used  Vaping Use  . Vaping Use: Never used  Substance Use Topics  . Alcohol use: Yes    Comment: socially  . Drug use: No    Home Medications Prior to Admission medications   Medication Sig Start Date End Date Taking? Authorizing Provider  cyclobenzaprine (FLEXERIL) 10 MG tablet Take 1 tablet (10 mg total) by mouth 2 (two) times daily as needed for muscle spasms. 03/07/20  Yes Fayrene Helper, PA-C  ibuprofen (ADVIL) 600 MG tablet Take 1 tablet (600 mg total) by mouth every 6 (six) hours as needed. 03/07/20  Yes Fayrene Helper, PA-C  Liniments (BEN GAY EX) Apply 1 application topically daily as needed (muscle aches).   Yes [provider]  nabumetone (RELAFEN) 750 MG tablet Take 750 mg by mouth 2 (two) times daily.   Yes [provider]  predniSONE (STERAPRED UNI-PAK 21 TAB) 10 MG (21) TBPK tablet Take by mouth daily. Take 6 tablets on day 1, 5 tablets on day 2, 4 tablets on day 3, 3 tablets on day 4, 2 tablets on day 5, and 1 tablet on day 6 03/07/20  Yes Fayrene Helper, PA-C  lidocaine (LIDODERM) 5 % Place 1 patch onto the skin daily. Remove & Discard patch within 12 hours  or as directed by MD Patient not taking: No sig reported 01/15/20   Sabas Sous, MD    Allergies    Naproxen  Review of Systems   Review of Systems  Constitutional: Negative for chills and fever.  HENT: Negative for ear pain, facial swelling, sore throat and trouble swallowing.   Eyes: Negative for pain and visual disturbance.  Respiratory: Negative for cough and shortness of breath.   Cardiovascular: Negative for chest pain and palpitations.  Gastrointestinal: Negative for abdominal pain and vomiting.  Genitourinary: Negative for dysuria and hematuria.  Musculoskeletal: Positive for back pain and gait problem. Negative for  arthralgias.  Skin: Negative for color change and rash.  Neurological: Positive for numbness. Negative for dizziness, seizures, syncope, weakness, light-headedness and headaches.  All other systems reviewed and are negative.   Physical Exam Updated Vital Signs BP 111/68   Pulse 77   Temp 98.2 F (36.8 C) (Oral)   Resp 18   Ht 5\' 9"  (1.753 m)   Wt 88.5 kg   SpO2 95%   BMI 28.80 kg/m   Physical Exam Vitals and nursing note reviewed.  Constitutional:      General: He is awake. He is not in acute distress.    Appearance: Normal appearance. He is well-developed, well-groomed and well-nourished. He is not ill-appearing.  HENT:     Head: Normocephalic and atraumatic.     Right Ear: External ear normal.     Left Ear: External ear normal.     Nose: Nose normal. No congestion or rhinorrhea.     Mouth/Throat:     Lips: Pink. No lesions.     Mouth: Mucous membranes are moist. No oral lesions or angioedema.     Tongue: No lesions.     Palate: No lesions.     Pharynx: Oropharynx is clear. Uvula midline. No oropharyngeal exudate, posterior oropharyngeal erythema or uvula swelling.  Eyes:     General: No visual field deficit or scleral icterus.       Right eye: No discharge.        Left eye: No discharge.     Extraocular Movements: Extraocular movements intact.     Conjunctiva/sclera: Conjunctivae normal.     Pupils: Pupils are equal, round, and reactive to light.  Neck:     Trachea: Phonation normal.  Cardiovascular:     Rate and Rhythm: Normal rate and regular rhythm.     Pulses: Normal pulses.     Heart sounds: Normal heart sounds. No murmur heard.   Pulmonary:     Effort: Pulmonary effort is normal. No respiratory distress.     Breath sounds: Normal breath sounds. No wheezing, rhonchi or rales.  Abdominal:     General: Abdomen is flat. There is no distension.     Palpations: Abdomen is soft.     Tenderness: There is no abdominal tenderness. There is no guarding or rebound.   Musculoskeletal:        General: Tenderness present. No signs of injury or edema.     Cervical back: Neck supple. No pain with movement, spinous process tenderness or muscular tenderness.     Right lower leg: No edema.     Left lower leg: No edema.     Comments: L paralumbar spinous process tenderness without midline spinous process tenderness.  Skin:    General: Skin is warm and dry.     Findings: No rash.  Neurological:     General: No focal deficit present.  Mental Status: He is alert and oriented to person, place, and time.     GCS: GCS eye subscore is 4. GCS verbal subscore is 5. GCS motor subscore is 6.     Cranial Nerves: Cranial nerves are intact. No cranial nerve deficit, dysarthria or facial asymmetry.     Motor: Motor function is intact. No weakness or pronator drift.     Coordination: Coordination is intact. Finger-Nose-Finger Test and Heel to [place] Test normal.     Gait: Gait is intact.     Comments: Hypesthesia noted to LUE and LLE; patient reports pins and needles sensation with palpation. Patient able to ambulate but walking with limp to LLE.  Psychiatric:        Mood and Affect: Mood and affect and mood normal.        Behavior: Behavior normal. Behavior is cooperative.     ED Results / Procedures / Treatments   Labs (all labs ordered are listed, but only abnormal results are displayed) Labs Reviewed  BASIC METABOLIC PANEL - Abnormal; Notable for the following components:      Result Value   Glucose, Bld 203 (*)    Calcium 8.4 (*)    All other components within normal limits  CBC WITH DIFFERENTIAL/PLATELET    EKG EKG Interpretation  Date/Time:  Sunday March 15 2020 14:40:25 EST Ventricular Rate:  66 PR Interval:  182 QRS Duration: 86 QT Interval:  384 QTC Calculation: 402 R Axis:   22 Text Interpretation: Sinus rhythm with Premature atrial complexes Possible Left atrial enlargement Borderline ECG since last tracing no significant change Confirmed  by Eber Hong ([id number]) on 03/15/2020 3:24:18 PM   Radiology CT Angio Head W or Wo Contrast  Result Date: 03/15/2020 CLINICAL DATA:  Neuro deficit, acute, stroke suspected L-sided numbness EXAM: CT ANGIOGRAPHY HEAD AND NECK TECHNIQUE: Multidetector CT imaging of the head and neck was performed using the standard protocol during bolus administration of intravenous contrast. Multiplanar CT image reconstructions and MIPs were obtained to evaluate the vascular anatomy. Carotid stenosis measurements (when applicable) are obtained utilizing NASCET criteria, using the distal internal carotid diameter as the denominator. CONTRAST:  66mL OMNIPAQUE IOHEXOL 350 MG/ML SOLN COMPARISON:  05/06/2014. FINDINGS: CT HEAD FINDINGS Brain: No acute infarct or intracranial hemorrhage. No mass lesion. No midline shift, ventriculomegaly or extra-axial fluid collection. Vascular: No hyperdense vessel or unexpected calcification. Skull: Negative for fracture or focal lesion. Sinuses/Orbits: Normal orbits. Small right sphenoid sinus mucous retention cyst. No mastoid effusion. Other: None. Review of the MIP images confirms the above findings CTA NECK FINDINGS Aortic arch: Standard branching. Imaged portion shows no evidence of aneurysm or dissection. No significant stenosis of the major arch vessel origins. Right carotid system: No evidence of dissection, stenosis (50% or greater) or occlusion. Left carotid system: No evidence of dissection, stenosis (50% or greater) or occlusion. Vertebral arteries: Dominant right vertebral artery. No evidence of dissection, stenosis (50% or greater) or occlusion. Skeleton: No acute finding.  Multilevel spondylosis. Other neck: No adenopathy.  No soft tissue mass. Upper chest: Upper lung atelectasis. Review of the MIP images confirms the above findings CTA HEAD FINDINGS Anterior circulation: Bilateral carotid siphon atherosclerotic calcifications. Mild right supraclinoid ICA narrowing. Right A1 segment  hypoplasia. Patent ACAs. Patent MCAs. Posterior circulation: Dominant right vertebral artery. Patent V4 segments. Right V4 segment atherosclerotic calcifications with mild narrowing. Patent proximal PICA. Patent basilar and superior cerebellar arteries. Patent bilateral PCAs with mild multifocal narrowing on the right. Venous sinuses: As permitted  by contrast timing, patent. Anatomic variants: Please see above. Review of the MIP images confirms the above findings IMPRESSION: Head CT: No acute intracranial process. CTA neck: No emergent vascular finding within the neck. CT head: No emergent vascular finding within the head. Mild right supraclinoid ICA and right PCA narrowing. Electronically Signed   By: Stana Bunting M.D.   On: 03/15/2020 18:46   CT Angio Neck W and/or Wo Contrast  Result Date: 03/15/2020 CLINICAL DATA:  Neuro deficit, acute, stroke suspected L-sided numbness EXAM: CT ANGIOGRAPHY HEAD AND NECK TECHNIQUE: Multidetector CT imaging of the head and neck was performed using the standard protocol during bolus administration of intravenous contrast. Multiplanar CT image reconstructions and MIPs were obtained to evaluate the vascular anatomy. Carotid stenosis measurements (when applicable) are obtained utilizing NASCET criteria, using the distal internal carotid diameter as the denominator. CONTRAST:  80mL OMNIPAQUE IOHEXOL 350 MG/ML SOLN COMPARISON:  05/06/2014. FINDINGS: CT HEAD FINDINGS Brain: No acute infarct or intracranial hemorrhage. No mass lesion. No midline shift, ventriculomegaly or extra-axial fluid collection. Vascular: No hyperdense vessel or unexpected calcification. Skull: Negative for fracture or focal lesion. Sinuses/Orbits: Normal orbits. Small right sphenoid sinus mucous retention cyst. No mastoid effusion. Other: None. Review of the MIP images confirms the above findings CTA NECK FINDINGS Aortic arch: Standard branching. Imaged portion shows no evidence of aneurysm or  dissection. No significant stenosis of the major arch vessel origins. Right carotid system: No evidence of dissection, stenosis (50% or greater) or occlusion. Left carotid system: No evidence of dissection, stenosis (50% or greater) or occlusion. Vertebral arteries: Dominant right vertebral artery. No evidence of dissection, stenosis (50% or greater) or occlusion. Skeleton: No acute finding.  Multilevel spondylosis. Other neck: No adenopathy.  No soft tissue mass. Upper chest: Upper lung atelectasis. Review of the MIP images confirms the above findings CTA HEAD FINDINGS Anterior circulation: Bilateral carotid siphon atherosclerotic calcifications. Mild right supraclinoid ICA narrowing. Right A1 segment hypoplasia. Patent ACAs. Patent MCAs. Posterior circulation: Dominant right vertebral artery. Patent V4 segments. Right V4 segment atherosclerotic calcifications with mild narrowing. Patent proximal PICA. Patent basilar and superior cerebellar arteries. Patent bilateral PCAs with mild multifocal narrowing on the right. Venous sinuses: As permitted by contrast timing, patent. Anatomic variants: Please see above. Review of the MIP images confirms the above findings IMPRESSION: Head CT: No acute intracranial process. CTA neck: No emergent vascular finding within the neck. CT head: No emergent vascular finding within the head. Mild right supraclinoid ICA and right PCA narrowing. Electronically Signed   By: Stana Bunting M.D.   On: 03/15/2020 18:46   MR Brain Wo Contrast (neuro protocol)  Result Date: 03/15/2020 CLINICAL DATA:  Neuro deficit, acute, stroke suspected L-sided paresthesia EXAM: MRI HEAD WITHOUT CONTRAST TECHNIQUE: Multiplanar, multiecho pulse sequences of the brain and surrounding structures were obtained without intravenous contrast. COMPARISON:  03/15/2020 and prior. FINDINGS: Examination was terminated early secondary to patient disposition. Please note limited sequence acquisition limits  evaluation. Brain: No diffusion-weighted signal abnormality. No intracranial hemorrhage. No midline shift, ventriculomegaly or extra-axial fluid collection. No mass lesion. Minimal chronic microvascular ischemic changes. Vascular: Please see recent CTA. Skull and upper cervical spine: Normal marrow signal. Sinuses/Orbits: Normal orbits. Right maxillary sinus mucous retention cyst. No mastoid effusion. Other: Left mastoid susceptibility artifact. IMPRESSION: No acute intracranial process. Minimal chronic microvascular ischemic changes. Electronically Signed   By: Stana Bunting M.D.   On: 03/15/2020 20:26   MR LUMBAR SPINE WO CONTRAST  Result Date: 03/15/2020 CLINICAL DATA:  Progressive low back pain. EXAM: MRI LUMBAR SPINE WITHOUT CONTRAST TECHNIQUE: Multiplanar, multisequence MR imaging of the lumbar spine was performed. No intravenous contrast was administered. COMPARISON:  Radiography 03/07/2020 FINDINGS: Segmentation:  5 lumbar type vertebral bodies. Alignment:  Normal Vertebrae: No fracture or primary bone lesion. Chronic discogenic endplate marrow changes at L5-S1, which could be associated with low back pain. Conus medullaris and cauda equina: Conus extends to the L1-2 level. Conus and cauda equina appear normal. Paraspinal and other soft tissues: Negative Disc levels: T12-L1: Desiccation and mild bulging of the disc. No compressive stenosis. L1-2 and L2-3: Normal L3-4: Bulging of the disc. Facet and ligamentous hypertrophy. Facet arthropathy much worse on the right, with foraminal encroachment that could compress the right L3 nerve. There is edema of this joint which could be associated with back pain or referred facet syndrome pain as well. L4-5: Desiccation and bulging of the disc. Shallow left foraminal disc herniation. Bulging disc material also encroaches somewhat upon the foramen on the right. Bilateral facet arthropathy with hypertrophy, joint effusions and edema. Foraminal stenosis left  worse than right could compress either or both L4 nerves, more likely the left. Facet arthropathy would likely be a cause of back pain or referred facet syndrome pain. L5-S1: Chronic disc degeneration with loss of disc height. Endplate osteophytes and bulging of the disc. No central canal stenosis. Foraminal narrowing on both sides with some potential to affect either or both L5 nerves. Bilateral facet osteoarthritis could contribute to low back pain. IMPRESSION: L3-4: Disc bulge. Asymmetric facet arthropathy on the right. Right foraminal stenosis that could compress the right L3 nerve. Edematous change of the facets, right more than left, which could contribute to back pain or referred facet syndrome pain. L4-5: Disc bulging with foraminal protrusions, larger on the left than the right. Bilateral facet arthropathy. Either L4 nerve could be compressed, more likely the left. The facet arthropathy shows pronounced edema and would likely be associated with back pain or referred facet syndrome pain. L5-S1: Chronic disc degeneration. Chronic endplate marrow changes which could be associated with low back pain. Bilateral foraminal encroachment by osteophyte and bulging disc material could possibly affect either L5 nerve. The facet arthropathy, though not as advanced as the levels above, could contribute to low back pain. Electronically Signed   By: Paulina FusiMark  Shogry M.D.   On: 03/15/2020 23:40    Procedures Procedures  Medications Ordered in ED Medications  diphenhydrAMINE (BENADRYL) injection 25 mg (25 mg Intravenous Given 03/15/20 1702)  iohexol (OMNIPAQUE) 350 MG/ML injection 80 mL (80 mLs Intravenous Contrast Given 03/15/20 1827)  LORazepam (ATIVAN) injection 1 mg (1 mg Intravenous Given 03/15/20 1926)  LORazepam (ATIVAN) injection 2 mg (2 mg Intravenous Given 03/15/20 2304)    ED Course  I have reviewed the triage vital signs and the nursing notes.  Pertinent labs & imaging results that were available during  my care of the patient were reviewed by me and considered in my medical decision making (see chart for details).    MDM Rules/Calculators/A&P                          Patient is a 59yoM with history and physical as described above who presents to the ED for perioral paresthesias and L-sided sensation loss. VS reassuring and HDS. Patient in no acute distress and resting comfortably. Hypesthesias noted to LUE and LLE prompting CTA head and neck as well as Neurology consultation who recommend MRI  brain. Presentation not consistent with anaphylaxis. CTA imaging and MRI brain unremarkable. Neurology evaluated patient in ED and further recommend obtaining MRI lumbar spine given patient's ongoing    Final Clinical Impression(s) / ED Diagnoses Final diagnoses:  None    Rx / DC Orders ED Discharge Orders    None

## 2020-03-15 NOTE — ED Provider Notes (Signed)
Care assumed from Dr. Mellody Dance.  Please see his full H&P.  In short,  Terry Mckinney is a 60 y.o. male presents for low back pain and possible allergic reaction.  Patient reports he took a medication to help his low back pain but this was in the same family as NSAIDs for which she is allergic.  He reports this made him itch which initially prompted his visit to the emergency department.  Once here he began to discuss his pain and weakness for which a stroke work-up was initiated and patient was evaluated by neurology.  CT/MRI of his brain were negative however given his low back pain and difficulty walking neurology recommended MRI L-spine.  Plan: follow-up with outpatient neuro. Dispo per MRI l-spine findings.    Physical Exam  BP 111/68    Pulse 77    Temp 98.2 F (36.8 C) (Oral)    Resp 18    Ht 5\' 9"  (1.753 m)    Wt 88.5 kg    SpO2 95%    BMI 28.80 kg/m   Physical Exam Vitals and nursing note reviewed.  Constitutional:      General: He is not in acute distress.    Appearance: He is well-developed and well-nourished.  HENT:     Head: Normocephalic.  Eyes:     General: No scleral icterus.    Conjunctiva/sclera: Conjunctivae normal.  Cardiovascular:     Rate and Rhythm: Normal rate.     Pulses: Intact distal pulses.  Pulmonary:     Effort: Pulmonary effort is normal.  Musculoskeletal:        General: Normal range of motion.     Cervical back: Normal range of motion.  Skin:    General: Skin is warm and dry.  Neurological:     Mental Status: He is alert.     Comments: Patient ambulatory in the room without difficulty.  No ataxia or gait disturbance.     ED Course/Procedures     Procedures    MR LUMBAR SPINE WO CONTRAST  Result Date: 03/15/2020 CLINICAL DATA:  Progressive low back pain. EXAM: MRI LUMBAR SPINE WITHOUT CONTRAST TECHNIQUE: Multiplanar, multisequence MR imaging of the lumbar spine was performed. No intravenous contrast was administered. COMPARISON:  Radiography  03/07/2020 FINDINGS: Segmentation:  5 lumbar type vertebral bodies. Alignment:  Normal Vertebrae: No fracture or primary bone lesion. Chronic discogenic endplate marrow changes at L5-S1, which could be associated with low back pain. Conus medullaris and cauda equina: Conus extends to the L1-2 level. Conus and cauda equina appear normal. Paraspinal and other soft tissues: Negative Disc levels: T12-L1: Desiccation and mild bulging of the disc. No compressive stenosis. L1-2 and L2-3: Normal L3-4: Bulging of the disc. Facet and ligamentous hypertrophy. Facet arthropathy much worse on the right, with foraminal encroachment that could compress the right L3 nerve. There is edema of this joint which could be associated with back pain or referred facet syndrome pain as well. L4-5: Desiccation and bulging of the disc. Shallow left foraminal disc herniation. Bulging disc material also encroaches somewhat upon the foramen on the right. Bilateral facet arthropathy with hypertrophy, joint effusions and edema. Foraminal stenosis left worse than right could compress either or both L4 nerves, more likely the left. Facet arthropathy would likely be a cause of back pain or referred facet syndrome pain. L5-S1: Chronic disc degeneration with loss of disc height. Endplate osteophytes and bulging of the disc. No central canal stenosis. Foraminal narrowing on both sides with some potential  to affect either or both L5 nerves. Bilateral facet osteoarthritis could contribute to low back pain. IMPRESSION: L3-4: Disc bulge. Asymmetric facet arthropathy on the right. Right foraminal stenosis that could compress the right L3 nerve. Edematous change of the facets, right more than left, which could contribute to back pain or referred facet syndrome pain. L4-5: Disc bulging with foraminal protrusions, larger on the left than the right. Bilateral facet arthropathy. Either L4 nerve could be compressed, more likely the left. The facet arthropathy shows  pronounced edema and would likely be associated with back pain or referred facet syndrome pain. L5-S1: Chronic disc degeneration. Chronic endplate marrow changes which could be associated with low back pain. Bilateral foraminal encroachment by osteophyte and bulging disc material could possibly affect either L5 nerve. The facet arthropathy, though not as advanced as the levels above, could contribute to low back pain. Electronically Signed   By: Paulina Fusi M.D.   On: 03/15/2020 23:40     MDM   1:28 AM MRI L-spine with evidence of degenerative disc disease and spinal stenosis.  Additionally some edematous changes to the facets.  All of this is likely causing patient's symptoms.  Nothing emergent to be handled tonight.  Will need outpatient follow-up with neurology and neurosurgery.  Referral was given.  Discussed findings with patient and spouse.  Questions answered.  Patient ambulatory without difficulty at discharge.  Paresthesia  Acute low back pain, unspecified back pain laterality, unspecified whether sciatica present          Milta Deiters 03/16/20 [id number]    Zadie Rhine, MD 03/16/20 [phone]

## 2020-03-15 NOTE — ED Provider Notes (Signed)
This patient is a 60 year old male presenting with a complex of symptoms including perioral numbness as well as left arm and leg numbness paresthesias and hyperesthesias.  On my exam the patient has no facial droop, he does have an elongated uvula and he does feel like there is something tickling the back of his throat or feeling like there is something he cannot clear.  Otherwise his phonation is normal breathing is normal, no wheezing, no swelling of the tongue or the lips.  He does have decreased sensation to the lips, decreased sensation of the left arm and leg but normal strength and coordination.  Memory is normal, vital signs are normal, no difficulty with breathing.  This may be related to the Relafen which she started this morning, he does have a history of a significant and severe reaction to naproxen in the past.  Because of the unilateral nature of the symptoms we will consult with neurology, CT scan of the brain  Neurology requests MR of the brain,  No stroke found -  Reinforced need to avoid nsaids  I saw and evaluated the patient, reviewed the resident's note and I agree with the findings and plan.  I was personally present and directly supervised the following procedures:  Medical evaluation  I personally interpreted the EKG as well as the resident and agree with the interpretation on the resident's chart.  Final diagnoses:  Paresthesia  Acute low back pain, unspecified back pain laterality, unspecified whether sciatica present         Eber Hong, MD 03/17/20 1030

## 2020-03-15 NOTE — Consult Note (Signed)
Neurology Consultation Reason for Consult: Left-sided paresthesias and left leg weakness Requesting Physician: Eber Hong  CC: Left-sided paresthesias and left leg weakness   History is obtained from: Patient, wife at bedside, and chart review  HPI: Terry Mckinney is a 60 y.o. male with no significant past medical history, who presents today with perioral numbness, and left face, arm and left leg numbness/paresthesias.  He was in his usual state of health when he was lifting something heavy at work in late December.  This back pain apparently started around 12/26 while lifting something heavy at work and has been worsening over 2 to 3 days before he initially presented on 12/29.  He was initially treated with lidocaine and cyclobenzaprine.  Unfortunately his pain continued to worsen and he was seen again on 03/07/2020 for pain in his lower back radiating down the left leg which at the time he described as a sharp sharp/shooting/throbbing sensation that initially had radiated only to his thigh but now was radiating down the entire leg.  Examination was notable for tenderness in the left lumbosacral region with positive left straight leg raise and limp while walking.  X-ray of lumbar spine demonstrated mild degenerative disc disease and he was treated for sciatica with cyclobenzaprine, prednisone, ibuprofen.   Today he was seen by his Worker's [facility]. clinic and given nabumetone his pain.  He has a known allergy to naproxen (anaphylaxis/shortness of breath), and he began to have perioral numbness and tingling in his throat for which he presented again to the ED here.  He noted that the mouth and throat sensation started first and was followed by tingling that went down his arm and into his pinky and ring fingers.  At the same time he was having his usual low back pain in the left that radiates down into the hip and foot.  ROS: All other review of systems was negative except as noted in the HPI and he  additionally notes he started smoking 1 ppd again, and in setting he has had a cough for the past 3 months.  This is worse at night and relieved by Alka-Seltzer cold and flu/honey.  It is associated sometimes with a sensation of choking on his phlegm.  He reports his phlegm is clear  Past Medical History:  Diagnosis Date  . Phobia    Past Surgical History:  Procedure Laterality Date  . ABDOMINAL SURGERY    . I & D EXTREMITY Right 11/07/2013   Procedure: IRRIGATION AND DEBRIDEMENT EXTREMITY;  Surgeon: Betha Loa, MD;  Location: [facility] OR;  Service: Orthopedics;  Laterality: Right;  . KNEE SURGERY     Current Meds  Medication Sig  . cyclobenzaprine (FLEXERIL) 10 MG tablet Take 1 tablet (10 mg total) by mouth 2 (two) times daily as needed for muscle spasms.  Marland Kitchen ibuprofen (ADVIL) 600 MG tablet Take 1 tablet (600 mg total) by mouth every 6 (six) hours as needed.  . Liniments (BEN GAY EX) Apply 1 application topically daily as needed (muscle aches).  . nabumetone (RELAFEN) 750 MG tablet Take 750 mg by mouth 2 (two) times daily.  . predniSONE (STERAPRED UNI-PAK 21 TAB) 10 MG (21) TBPK tablet Take by mouth daily. Take 6 tablets on day 1, 5 tablets on day 2, 4 tablets on day 3, 3 tablets on day 4, 2 tablets on day 5, and 1 tablet on day 6     Family History  Problem Relation Age of Onset  . Diabetes Other   .  Hypertension Other   . Diabetes Mother    Social History:  reports that he has been smoking cigars. He has been smoking about 1.00 pack per day. He has never used smokeless tobacco. He reports current alcohol use. He reports that he does not use drugs.  He works as a Psychologist, occupational and had concern for metal fragments entering his eye on 03/27/2016  Exam: Current vital signs: BP 114/87   Pulse 64   Temp 98.2 F (36.8 C) (Oral)   Resp 17   Ht 5\' 9"  (1.753 m)   Wt 88.5 kg   SpO2 94%   BMI 28.80 kg/m  Vital signs in last 24 hours: Temp:  [98.2 F (36.8 C)] 98.2 F (36.8 C) (02/27  1437) Pulse Rate:  [57-70] 64 (02/27 1915) Resp:  [13-30] 17 (02/27 1915) BP: (114-156)/(84-105) 114/87 (02/27 1915) SpO2:  [93 %-100 %] 94 % (02/27 1915) Weight:  [88.5 kg] 88.5 kg (02/27 1459)   Physical Exam  Constitutional: Appears well-developed and well-nourished.  Psych: Affect appropriate to situation, calm and cooperative, mildly frustrated Eyes: No scleral injection HENT: No oropharyngeal obstruction.  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  Pain to palpation at L4/L5 Cardiovascular: Normal rate and regular rhythm.  Respiratory: Effort normal, non-labored breathing GI: Soft.  No distension. There is no tenderness.  Skin: Warm dry and intact visible skin  Neuro: Mental Status: Patient is awake, alert, oriented to person, place, month, year, and situation. Patient is able to give a clear and coherent history. No signs of aphasia or neglect Cranial Nerves: II: Visual Fields are full. Pupils are equal, round, and reactive to light.   III,IV, VI: EOMI without ptosis or diploplia.  V: Facial sensation is symmetric to temperature and pinprick except V3 on the left VII: Facial movement is symmetric.  VIII: hearing is intact to voice X: Uvula elevates symmetrically XI: Shoulder shrug is symmetric. XII: tongue is midline without atrophy or fasciculations.  Motor: Tone is normal. Bulk is normal. 5/5 strength was present in all four extremities.  Sensory: Sensation is symmetric to light touch and temperature in the arms and legs, but reduced to touch and temperature and pinprick on the medial aspect of left thigh as well as the medial and lateral aspects of the left calf.  Tapping the left cubital tunnel reproduces the left pinky and finger paresthesias Deep Tendon Reflexes: 2+ and symmetric in the biceps, 2+ on the right patella, 0 on the left patella, 2+ in the right ankle, 1+ in the left ankle Plantars: Toes are down on the right and mute on the  left Cerebellar: FNF and HKS are intact bilaterally within limits of pain on the left leg  I have reviewed labs in epic and the results pertinent to this consultation are: Glucose 203 Creatinine 0.94 CBC unremarkable  I have reviewed the images obtained: MRI brain with significant left mastoid artifact and limited sequences given patient's claustrophobia, but within these limits it was negative for acute intracranial process CTA w/o LVO, Left mastoid hyperdensity reviewed with radiology who agreed that there appears to be a small piece of metal in the left ear canal Notably cervical spine CT 06/2018 did demonstrate some neural foraminal narrowing bilaterally, obtained for pain/stiffness   Impression: 60 year old man with past medical history significant for degenerative disc disease presenting with lumbar pain and allergic reaction.  On examination, he has pain limited movement of the left lower leg with sensory changes and dropped reflexes concerning  for localization to the lumbar nerve roots (particularly L4/L5), versus lumbar plexus. Most likely he has slipped disks in the setting of his original work injury, but given worsening over time this should be evaluated with MRI lumbar spine. He additionally has evidence of cubital tunnel syndrome with reproduction of his arm sensory changes with tapping of the left cubital tunnel, which may be a double crush phenomenon secondary to cervical spine impingement as well. After examining the patient, on review of the CTA with radiology, there is also evidence of a metal foreign body in the patient's left ear canal  Recommendations: -Evaluation of the metal foreign body in the patient's left ear canal per ED -Patient instructed to wrap a towel around his arm and avoid leaning on his left elbow to reduce left arm paresthesias. If these conservative measures are not helpful, outpatient EMG/nerve conduction study would be useful procedure -MRI lumbar spine  without contrast, 2 mg Ativan for sedation. Should this show no urgently actionable pathology, patient is neurologically appropriate for discharge pending medical clearance per ED   Brooke Dare MD-PhD [facility] [phone] Available 7 PM to 7 AM, outside of these hours please call Neurologist on call as listed on Amion.

## 2020-03-15 NOTE — ED Provider Notes (Signed)
[facility] EMERGENCY DEPARTMENT Provider Note   CSN: [id number] Arrival date & time: 03/15/20  1431     History Chief Complaint  Patient presents with  . Allergic Reaction    Terry Mckinney is a 60 y.o. male w/ h/o tobacco use who presents to the ED for possible allergic reaction. Patient prescribed nabumetone today for lower back pain he has been having for the last 2 months. 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 Shortly after taking first dose of nabumetone, patient developed perioral paresthesias as well as numbness across the left side of his body as well as L arm and L leg. Patient also having ongoing L low back pain that radiates down his L leg. Wife reports him having increasing difficulty walking over the last several days as well. Denies SOB, wheezing, GI upset, throat or facial swelling, urticaria, or rash. He did not try taking anything for his symptoms at home. Denies headache, vision changes, hearing loss, neck pain, dizziness/lightheaded, or weakness.  The history is provided by the patient, the spouse and medical records.  Neurologic Problem This is a new problem. The current episode started 6 to 12 hours ago. The problem occurs constantly. The problem has not changed since onset.Pertinent negatives include no chest pain, no abdominal pain, no headaches and no shortness of breath. Nothing aggravates the symptoms. Nothing relieves the symptoms. He has tried nothing for the symptoms.       Past Medical History:  Diagnosis Date  . Phobia     There are no problems to display for this patient.   Past Surgical History:  Procedure Laterality Date  . ABDOMINAL SURGERY    . I & D EXTREMITY Right 11/07/2013   Procedure: IRRIGATION AND DEBRIDEMENT EXTREMITY;  Surgeon: Betha Loa, MD;  Location: [facility] OR;  Service: Orthopedics;  Laterality: Right;  . KNEE SURGERY         Family History  Problem Relation Age of Onset  . Diabetes Other   . Hypertension  Other   . Diabetes Mother     Social History   Tobacco Use  . Smoking status: Current Every Day Smoker    Packs/day: 1.00    Types: Cigars  . Smokeless tobacco: Never Used  Vaping Use  . Vaping Use: Never used  Substance Use Topics  . Alcohol use: Yes    Comment: socially  . Drug use: No    Home Medications Prior to Admission medications   Medication Sig Start Date End Date Taking? Authorizing Provider  cyclobenzaprine (FLEXERIL) 10 MG tablet Take 1 tablet (10 mg total) by mouth 2 (two) times daily as needed for muscle spasms. 03/07/20  Yes Fayrene Helper, PA-C  ibuprofen (ADVIL) 600 MG tablet Take 1 tablet (600 mg total) by mouth every 6 (six) hours as needed. 03/07/20  Yes Fayrene Helper, PA-C  Liniments (BEN GAY EX) Apply 1 application topically daily as needed (muscle aches).   Yes [provider]  nabumetone (RELAFEN) 750 MG tablet Take 750 mg by mouth 2 (two) times daily.   Yes [provider]  predniSONE (STERAPRED UNI-PAK 21 TAB) 10 MG (21) TBPK tablet Take by mouth daily. Take 6 tablets on day 1, 5 tablets on day 2, 4 tablets on day 3, 3 tablets on day 4, 2 tablets on day 5, and 1 tablet on day 6 03/07/20  Yes Fayrene Helper, PA-C  lidocaine (LIDODERM) 5 % Place 1 patch onto the skin daily. Remove & Discard patch within 12 hours  or as directed by MD Patient not taking: No sig reported 01/15/20   Sabas Sous, MD    Allergies    Naproxen  Review of Systems   Review of Systems  Constitutional: Negative for chills and fever.  HENT: Negative for ear pain, facial swelling, sore throat and trouble swallowing.   Eyes: Negative for pain and visual disturbance.  Respiratory: Negative for cough and shortness of breath.   Cardiovascular: Negative for chest pain and palpitations.  Gastrointestinal: Negative for abdominal pain and vomiting.  Genitourinary: Negative for dysuria and hematuria.  Musculoskeletal: Positive for back pain and gait problem. Negative for  arthralgias.  Skin: Negative for color change and rash.  Neurological: Positive for numbness. Negative for dizziness, seizures, syncope, weakness, light-headedness and headaches.  All other systems reviewed and are negative.   Physical Exam Updated Vital Signs BP 111/68   Pulse 77   Temp 98.2 F (36.8 C) (Oral)   Resp 18   Ht 5\' 9"  (1.753 m)   Wt 88.5 kg   SpO2 95%   BMI 28.80 kg/m   Physical Exam Vitals and nursing note reviewed.  Constitutional:      General: He is awake. He is not in acute distress.    Appearance: Normal appearance. He is well-developed, well-groomed and well-nourished. He is not ill-appearing.  HENT:     Head: Normocephalic and atraumatic.     Right Ear: External ear normal.     Left Ear: External ear normal.     Nose: Nose normal. No congestion or rhinorrhea.     Mouth/Throat:     Lips: Pink. No lesions.     Mouth: Mucous membranes are moist. No oral lesions or angioedema.     Tongue: No lesions.     Palate: No lesions.     Pharynx: Oropharynx is clear. Uvula midline. No oropharyngeal exudate, posterior oropharyngeal erythema or uvula swelling.  Eyes:     General: No visual field deficit or scleral icterus.       Right eye: No discharge.        Left eye: No discharge.     Extraocular Movements: Extraocular movements intact.     Conjunctiva/sclera: Conjunctivae normal.     Pupils: Pupils are equal, round, and reactive to light.  Neck:     Trachea: Phonation normal.  Cardiovascular:     Rate and Rhythm: Normal rate and regular rhythm.     Pulses: Normal pulses.     Heart sounds: Normal heart sounds. No murmur heard.   Pulmonary:     Effort: Pulmonary effort is normal. No respiratory distress.     Breath sounds: Normal breath sounds. No wheezing, rhonchi or rales.  Abdominal:     General: Abdomen is flat. There is no distension.     Palpations: Abdomen is soft.     Tenderness: There is no abdominal tenderness. There is no guarding or rebound.   Musculoskeletal:        General: Tenderness present. No signs of injury or edema.     Cervical back: Neck supple. No pain with movement, spinous process tenderness or muscular tenderness.     Right lower leg: No edema.     Left lower leg: No edema.     Comments: L paralumbar spinous process tenderness without midline spinous process tenderness.  Skin:    General: Skin is warm and dry.     Findings: No rash.  Neurological:     General: No focal deficit present.  Mental Status: He is alert and oriented to person, place, and time.     GCS: GCS eye subscore is 4. GCS verbal subscore is 5. GCS motor subscore is 6.     Cranial Nerves: Cranial nerves are intact. No cranial nerve deficit, dysarthria or facial asymmetry.     Motor: Motor function is intact. No weakness or pronator drift.     Coordination: Coordination is intact. Finger-Nose-Finger Test and Heel to [place] Test normal.     Gait: Gait is intact.     Comments: Hypesthesia noted to LUE and LLE; patient reports pins and needles sensation with palpation. Patient able to ambulate but walking with limp to LLE.  Psychiatric:        Mood and Affect: Mood and affect and mood normal.        Behavior: Behavior normal. Behavior is cooperative.     ED Results / Procedures / Treatments   Labs (all labs ordered are listed, but only abnormal results are displayed) Labs Reviewed  BASIC METABOLIC PANEL - Abnormal; Notable for the following components:      Result Value   Glucose, Bld 203 (*)    Calcium 8.4 (*)    All other components within normal limits  CBC WITH DIFFERENTIAL/PLATELET    EKG EKG Interpretation  Date/Time:  Sunday March 15 2020 14:40:25 EST Ventricular Rate:  66 PR Interval:  182 QRS Duration: 86 QT Interval:  384 QTC Calculation: 402 R Axis:   22 Text Interpretation: Sinus rhythm with Premature atrial complexes Possible Left atrial enlargement Borderline ECG since last tracing no significant change Confirmed  by Eber Hong ([id number]) on 03/15/2020 3:24:18 PM   Radiology CT Angio Head W or Wo Contrast  Result Date: 03/15/2020 CLINICAL DATA:  Neuro deficit, acute, stroke suspected L-sided numbness EXAM: CT ANGIOGRAPHY HEAD AND NECK TECHNIQUE: Multidetector CT imaging of the head and neck was performed using the standard protocol during bolus administration of intravenous contrast. Multiplanar CT image reconstructions and MIPs were obtained to evaluate the vascular anatomy. Carotid stenosis measurements (when applicable) are obtained utilizing NASCET criteria, using the distal internal carotid diameter as the denominator. CONTRAST:  66mL OMNIPAQUE IOHEXOL 350 MG/ML SOLN COMPARISON:  05/06/2014. FINDINGS: CT HEAD FINDINGS Brain: No acute infarct or intracranial hemorrhage. No mass lesion. No midline shift, ventriculomegaly or extra-axial fluid collection. Vascular: No hyperdense vessel or unexpected calcification. Skull: Negative for fracture or focal lesion. Sinuses/Orbits: Normal orbits. Small right sphenoid sinus mucous retention cyst. No mastoid effusion. Other: None. Review of the MIP images confirms the above findings CTA NECK FINDINGS Aortic arch: Standard branching. Imaged portion shows no evidence of aneurysm or dissection. No significant stenosis of the major arch vessel origins. Right carotid system: No evidence of dissection, stenosis (50% or greater) or occlusion. Left carotid system: No evidence of dissection, stenosis (50% or greater) or occlusion. Vertebral arteries: Dominant right vertebral artery. No evidence of dissection, stenosis (50% or greater) or occlusion. Skeleton: No acute finding.  Multilevel spondylosis. Other neck: No adenopathy.  No soft tissue mass. Upper chest: Upper lung atelectasis. Review of the MIP images confirms the above findings CTA HEAD FINDINGS Anterior circulation: Bilateral carotid siphon atherosclerotic calcifications. Mild right supraclinoid ICA narrowing. Right A1 segment  hypoplasia. Patent ACAs. Patent MCAs. Posterior circulation: Dominant right vertebral artery. Patent V4 segments. Right V4 segment atherosclerotic calcifications with mild narrowing. Patent proximal PICA. Patent basilar and superior cerebellar arteries. Patent bilateral PCAs with mild multifocal narrowing on the right. Venous sinuses: As permitted  by contrast timing, patent. Anatomic variants: Please see above. Review of the MIP images confirms the above findings IMPRESSION: Head CT: No acute intracranial process. CTA neck: No emergent vascular finding within the neck. CT head: No emergent vascular finding within the head. Mild right supraclinoid ICA and right PCA narrowing. Electronically Signed   By: Stana Bunting M.D.   On: 03/15/2020 18:46   CT Angio Neck W and/or Wo Contrast  Result Date: 03/15/2020 CLINICAL DATA:  Neuro deficit, acute, stroke suspected L-sided numbness EXAM: CT ANGIOGRAPHY HEAD AND NECK TECHNIQUE: Multidetector CT imaging of the head and neck was performed using the standard protocol during bolus administration of intravenous contrast. Multiplanar CT image reconstructions and MIPs were obtained to evaluate the vascular anatomy. Carotid stenosis measurements (when applicable) are obtained utilizing NASCET criteria, using the distal internal carotid diameter as the denominator. CONTRAST:  80mL OMNIPAQUE IOHEXOL 350 MG/ML SOLN COMPARISON:  05/06/2014. FINDINGS: CT HEAD FINDINGS Brain: No acute infarct or intracranial hemorrhage. No mass lesion. No midline shift, ventriculomegaly or extra-axial fluid collection. Vascular: No hyperdense vessel or unexpected calcification. Skull: Negative for fracture or focal lesion. Sinuses/Orbits: Normal orbits. Small right sphenoid sinus mucous retention cyst. No mastoid effusion. Other: None. Review of the MIP images confirms the above findings CTA NECK FINDINGS Aortic arch: Standard branching. Imaged portion shows no evidence of aneurysm or  dissection. No significant stenosis of the major arch vessel origins. Right carotid system: No evidence of dissection, stenosis (50% or greater) or occlusion. Left carotid system: No evidence of dissection, stenosis (50% or greater) or occlusion. Vertebral arteries: Dominant right vertebral artery. No evidence of dissection, stenosis (50% or greater) or occlusion. Skeleton: No acute finding.  Multilevel spondylosis. Other neck: No adenopathy.  No soft tissue mass. Upper chest: Upper lung atelectasis. Review of the MIP images confirms the above findings CTA HEAD FINDINGS Anterior circulation: Bilateral carotid siphon atherosclerotic calcifications. Mild right supraclinoid ICA narrowing. Right A1 segment hypoplasia. Patent ACAs. Patent MCAs. Posterior circulation: Dominant right vertebral artery. Patent V4 segments. Right V4 segment atherosclerotic calcifications with mild narrowing. Patent proximal PICA. Patent basilar and superior cerebellar arteries. Patent bilateral PCAs with mild multifocal narrowing on the right. Venous sinuses: As permitted by contrast timing, patent. Anatomic variants: Please see above. Review of the MIP images confirms the above findings IMPRESSION: Head CT: No acute intracranial process. CTA neck: No emergent vascular finding within the neck. CT head: No emergent vascular finding within the head. Mild right supraclinoid ICA and right PCA narrowing. Electronically Signed   By: Stana Bunting M.D.   On: 03/15/2020 18:46   MR Brain Wo Contrast (neuro protocol)  Result Date: 03/15/2020 CLINICAL DATA:  Neuro deficit, acute, stroke suspected L-sided paresthesia EXAM: MRI HEAD WITHOUT CONTRAST TECHNIQUE: Multiplanar, multiecho pulse sequences of the brain and surrounding structures were obtained without intravenous contrast. COMPARISON:  03/15/2020 and prior. FINDINGS: Examination was terminated early secondary to patient disposition. Please note limited sequence acquisition limits  evaluation. Brain: No diffusion-weighted signal abnormality. No intracranial hemorrhage. No midline shift, ventriculomegaly or extra-axial fluid collection. No mass lesion. Minimal chronic microvascular ischemic changes. Vascular: Please see recent CTA. Skull and upper cervical spine: Normal marrow signal. Sinuses/Orbits: Normal orbits. Right maxillary sinus mucous retention cyst. No mastoid effusion. Other: Left mastoid susceptibility artifact. IMPRESSION: No acute intracranial process. Minimal chronic microvascular ischemic changes. Electronically Signed   By: Stana Bunting M.D.   On: 03/15/2020 20:26   MR LUMBAR SPINE WO CONTRAST  Result Date: 03/15/2020 CLINICAL DATA:  Progressive low back pain. EXAM: MRI LUMBAR SPINE WITHOUT CONTRAST TECHNIQUE: Multiplanar, multisequence MR imaging of the lumbar spine was performed. No intravenous contrast was administered. COMPARISON:  Radiography 03/07/2020 FINDINGS: Segmentation:  5 lumbar type vertebral bodies. Alignment:  Normal Vertebrae: No fracture or primary bone lesion. Chronic discogenic endplate marrow changes at L5-S1, which could be associated with low back pain. Conus medullaris and cauda equina: Conus extends to the L1-2 level. Conus and cauda equina appear normal. Paraspinal and other soft tissues: Negative Disc levels: T12-L1: Desiccation and mild bulging of the disc. No compressive stenosis. L1-2 and L2-3: Normal L3-4: Bulging of the disc. Facet and ligamentous hypertrophy. Facet arthropathy much worse on the right, with foraminal encroachment that could compress the right L3 nerve. There is edema of this joint which could be associated with back pain or referred facet syndrome pain as well. L4-5: Desiccation and bulging of the disc. Shallow left foraminal disc herniation. Bulging disc material also encroaches somewhat upon the foramen on the right. Bilateral facet arthropathy with hypertrophy, joint effusions and edema. Foraminal stenosis left  worse than right could compress either or both L4 nerves, more likely the left. Facet arthropathy would likely be a cause of back pain or referred facet syndrome pain. L5-S1: Chronic disc degeneration with loss of disc height. Endplate osteophytes and bulging of the disc. No central canal stenosis. Foraminal narrowing on both sides with some potential to affect either or both L5 nerves. Bilateral facet osteoarthritis could contribute to low back pain. IMPRESSION: L3-4: Disc bulge. Asymmetric facet arthropathy on the right. Right foraminal stenosis that could compress the right L3 nerve. Edematous change of the facets, right more than left, which could contribute to back pain or referred facet syndrome pain. L4-5: Disc bulging with foraminal protrusions, larger on the left than the right. Bilateral facet arthropathy. Either L4 nerve could be compressed, more likely the left. The facet arthropathy shows pronounced edema and would likely be associated with back pain or referred facet syndrome pain. L5-S1: Chronic disc degeneration. Chronic endplate marrow changes which could be associated with low back pain. Bilateral foraminal encroachment by osteophyte and bulging disc material could possibly affect either L5 nerve. The facet arthropathy, though not as advanced as the levels above, could contribute to low back pain. Electronically Signed   By: Paulina FusiMark  Shogry M.D.   On: 03/15/2020 23:40    Procedures Procedures  Medications Ordered in ED Medications  diphenhydrAMINE (BENADRYL) injection 25 mg (25 mg Intravenous Given 03/15/20 1702)  iohexol (OMNIPAQUE) 350 MG/ML injection 80 mL (80 mLs Intravenous Contrast Given 03/15/20 1827)  LORazepam (ATIVAN) injection 1 mg (1 mg Intravenous Given 03/15/20 1926)  LORazepam (ATIVAN) injection 2 mg (2 mg Intravenous Given 03/15/20 2304)    ED Course  I have reviewed the triage vital signs and the nursing notes.  Pertinent labs & imaging results that were available during  my care of the patient were reviewed by me and considered in my medical decision making (see chart for details).    MDM Rules/Calculators/A&P                          Patient is a 59yoM with history and physical as described above who presents to the ED for perioral paresthesias and L-sided sensation loss. VS reassuring and HDS. Patient in no acute distress and resting comfortably. Benadryl given for possible allergic reaction, but no concern for anaphylaxis at this time. Hypesthesias noted to LUE and  LLE prompting CTA head and neck as well as Neurology consultation who recommend MRI brain. CTA imaging and MRI brain unremarkable. Neurology evaluated patient in ED and further recommend obtaining MRI lumbar spine given patient's ongoing LLE numbness and trouble ambulating. Neurology suspects LUE tingling sensation may possible be secondary to cubital tunnel syndrome after symptoms reproducible with tapping left cubital tunnel. Plan at time of sign out will be to follow up with Neurology after completion of MRI lumbar spine. Disposition pending Neurology recommendations. Patient otherwise remained HDS with no acute events under my care.  Transfer of care at 2330 to [facility]. Please refer to her note for further MDM and ultimate disposition. Patient in stable condition at time of transfer.   Final Clinical Impression(s) / ED Diagnoses Final diagnoses:  Paresthesia  Acute low back pain, unspecified back pain laterality, unspecified whether sciatica present    Rx / DC Orders ED Discharge Orders    None       Tonia Brooms, MD 03/16/20 Pernell Dupre    Eber Hong, MD 03/17/20 1030

## 2020-03-15 NOTE — ED Notes (Signed)
Pt transported to MRI 

## 2020-03-15 NOTE — ED Notes (Signed)
Patient transported to CT 

## 2020-03-15 NOTE — ED Notes (Signed)
Paged Neurology for Dr. Mellody Dance at 20:36

## 2020-03-16 NOTE — Discharge Instructions (Addendum)
1. Medications: usual home medications; not take any more of the medication that caused her allergic reaction today 2. Treatment: rest, drink plenty of fluids, gentle stretching of your back 3. Follow Up: Please followup with neurology and neurosurgery for further evaluation of your symptoms; Please return to the ER for new or worsening symptoms

## 2021-09-21 ENCOUNTER — Emergency Department (HOSPITAL_COMMUNITY)
Admission: EM | Admit: 2021-09-21 | Discharge: 2021-09-21 | Discharge status: Against Medical Advice | Payer: BC Managed Care – PPO | Attending: Student | Admitting: Student

## 2021-09-21 ENCOUNTER — Encounter (HOSPITAL_COMMUNITY): Payer: Self-pay | Admitting: Emergency Medicine

## 2021-09-21 ENCOUNTER — Other Ambulatory Visit: Payer: Self-pay

## 2021-09-21 ENCOUNTER — Emergency Department (HOSPITAL_COMMUNITY): Payer: BC Managed Care – PPO

## 2021-09-21 DIAGNOSIS — R079 Chest pain, unspecified: Secondary | ICD-10-CM | POA: Diagnosis present

## 2021-09-21 DIAGNOSIS — Z20822 Contact with and (suspected) exposure to covid-19: Secondary | ICD-10-CM | POA: Insufficient documentation

## 2021-09-21 DIAGNOSIS — Z720 Tobacco use: Secondary | ICD-10-CM | POA: Insufficient documentation

## 2021-09-21 DIAGNOSIS — R200 Anesthesia of skin: Secondary | ICD-10-CM | POA: Insufficient documentation

## 2021-09-21 DIAGNOSIS — R059 Cough, unspecified: Secondary | ICD-10-CM | POA: Insufficient documentation

## 2021-09-21 DIAGNOSIS — I10 Essential (primary) hypertension: Secondary | ICD-10-CM | POA: Insufficient documentation

## 2021-09-21 DIAGNOSIS — R0602 Shortness of breath: Secondary | ICD-10-CM | POA: Insufficient documentation

## 2021-09-21 DIAGNOSIS — Z5321 Procedure and treatment not carried out due to patient leaving prior to being seen by health care provider: Secondary | ICD-10-CM | POA: Insufficient documentation

## 2021-09-21 HISTORY — DX: Unspecified asthma, uncomplicated: J45.909

## 2021-09-21 LAB — COMPREHENSIVE METABOLIC PANEL
ALT: 23 U/L (ref 0–44)
AST: 28 U/L (ref 15–41)
Albumin: 4.1 g/dL (ref 3.5–5.0)
Alkaline Phosphatase: 77 U/L (ref 38–126)
Anion gap: 13 (ref 5–15)
BUN: 11 mg/dL (ref 6–20)
CO2: 22 mmol/L (ref 22–32)
Calcium: 9.5 mg/dL (ref 8.9–10.3)
Chloride: 103 mmol/L (ref 98–111)
Creatinine, Ser: 1.1 mg/dL (ref 0.61–1.24)
GFR, Estimated: >60 mL/min (ref >60)
Glucose, Bld: 95 mg/dL (ref 70–99)
Potassium: 3.9 mmol/L (ref 3.5–5.1)
Sodium: 138 mmol/L (ref 135–145)
Total Bilirubin: 1.2 mg/dL (ref 0.3–1.2)
Total Protein: 7.9 g/dL (ref 6.5–8.1)

## 2021-09-21 LAB — CBC WITH DIFFERENTIAL/PLATELET
Abs Immature Granulocytes: 0.04 10*3/uL (ref 0.00–0.07)
Basophils Absolute: 0.1 10*3/uL (ref 0.0–0.1)
Basophils Relative: 1 %
Eosinophils Absolute: 0.3 10*3/uL (ref 0.0–0.5)
Eosinophils Relative: 3 %
Hemoglobin: 18.2 g/dL — ABNORMAL HIGH (ref 13.0–17.0)
Immature Granulocytes: 0 %
Lymphocytes Relative: 23 %
Lymphs Abs: 2.3 10*3/uL (ref 0.7–4.0)
Monocytes Absolute: 0.6 10*3/uL (ref 0.1–1.0)
Monocytes Relative: 6 %
Neutro Abs: 6.7 10*3/uL (ref 1.7–7.7)
Neutrophils Relative %: 67 %
Platelets: 305 10*3/uL (ref 150–400)
WBC: 10 10*3/uL (ref 4.0–10.5)

## 2021-09-21 LAB — SARS CORONAVIRUS 2 BY RT PCR: SARS Coronavirus 2 by RT PCR: NEGATIVE

## 2021-09-21 LAB — TROPONIN I (HIGH SENSITIVITY): Troponin I (High Sensitivity): 4 ng/L (ref <18)

## 2021-09-21 NOTE — ED Triage Notes (Signed)
Patient arrives by POV c/o chest pain, shortness of breath and numbness in left arm when waking up this morning. Reports productive cough for a while. Patient adds he has been having breathing problems over the past 4 months.

## 2021-09-21 NOTE — ED Provider Triage Note (Signed)
Emergency Medicine Provider Triage Evaluation Note  Terry Mckinney , a 61 y.o. male  was evaluated in triage.  Pt complains of chest pain, cough, shortness of breath.  Symptoms began this morning upon awaking from sleep.  Patient reports a dull ache to the left side of his chest with associated shortness of breath and productive cough.  Patient reports having a baseline history of "breathing problems" for the past 4 months.  Patient denies any cardiac history.  Patient reports history of hypertension but denies any history of diabetes or hyperlipidemia.  Reports family history of cardiac disease.  Patient reports tobacco use.  No leg swelling.  No medications taken prior to arrival.  Patient also reports some left arm numbness  Review of Systems  Positive: Chest pain, shortness of breath, cough, left arm numbness Negative: Leg swelling, headache, lightheadedness, dizziness  Physical Exam  BP 118/89 (BP Location: Left Arm)   Pulse 62   Temp 98.3 F (36.8 C) (Oral)   Resp 17   Ht 5\' 9"  (1.753 m)   Wt 86.6 kg   SpO2 95%   BMI 28.21 kg/m  Gen:   Awake, no distress   Resp:  Normal effort decreased breath sounds in all lung fields with a productive cough MSK:   Moves extremities without difficulty  Other:  Heart regular rate and rhythm.  No rubs murmurs or gallops.  No lower extremity swelling.  Radial pulses are 2+ bilaterally.  Patient speaking in complete sentences  Medical Decision Making  Medically screening exam initiated at 12:21 PM.  Appropriate orders placed.  Terry Mckinney was informed that the remainder of the evaluation will be completed by another provider, this initial triage assessment does not replace that evaluation, and the importance of remaining in the ED until their evaluation is complete.  Patient presents for chest pain, shortness of breath and cough.  Patient denies cardiac history.  EKG was reviewed in triage today that shows normal sinus rhythm without any acute ischemic  changes and patient does have some J-point elevation.  Patient does have decreased breath sounds in all lung fields with productive cough noted.  Will obtain basic labs to include troponin, COVID-19 testing and chest x-ray to rule out any cardiac or pulmonary etiology.   Anselm Jungling, PA-C 09/21/21 1227

## 2021-09-21 NOTE — ED Notes (Signed)
Pt is not answering when being called to room. Nowhere to be found

## 2021-09-23 ENCOUNTER — Emergency Department (HOSPITAL_BASED_OUTPATIENT_CLINIC_OR_DEPARTMENT_OTHER)
Admission: EM | Admit: 2021-09-23 | Discharge: 2021-09-23 | Disposition: A | Discharge status: Home / Self Care | Payer: BC Managed Care – PPO | Attending: Emergency Medicine | Admitting: Emergency Medicine

## 2021-09-23 ENCOUNTER — Emergency Department (HOSPITAL_BASED_OUTPATIENT_CLINIC_OR_DEPARTMENT_OTHER): Payer: BC Managed Care – PPO

## 2021-09-23 ENCOUNTER — Other Ambulatory Visit: Payer: Self-pay

## 2021-09-23 DIAGNOSIS — R079 Chest pain, unspecified: Secondary | ICD-10-CM | POA: Diagnosis not present

## 2021-09-23 DIAGNOSIS — R0602 Shortness of breath: Secondary | ICD-10-CM | POA: Insufficient documentation

## 2021-09-23 LAB — CBC WITH DIFFERENTIAL/PLATELET
Abs Immature Granulocytes: 0.03 10*3/uL (ref 0.00–0.07)
Basophils Absolute: 0.1 10*3/uL (ref 0.0–0.1)
Basophils Relative: 1 %
Eosinophils Absolute: 0.3 10*3/uL (ref 0.0–0.5)
Eosinophils Relative: 3 %
HCT: 43.9 % (ref 39.0–52.0)
Hemoglobin: 16.9 g/dL (ref 13.0–17.0)
Immature Granulocytes: 0 %
Lymphocytes Relative: 30 %
Lymphs Abs: 2.8 10*3/uL (ref 0.7–4.0)
MCH: 32.3 pg (ref 26.0–34.0)
MCHC: 38.5 g/dL — ABNORMAL HIGH (ref 30.0–36.0)
MCV: 83.8 fL (ref 80.0–100.0)
Monocytes Absolute: 0.5 10*3/uL (ref 0.1–1.0)
Monocytes Relative: 5 %
Neutro Abs: 5.8 10*3/uL (ref 1.7–7.7)
Neutrophils Relative %: 61 %
Platelets: 305 10*3/uL (ref 150–400)
RBC: 5.24 MIL/uL (ref 4.22–5.81)
RDW: 13.1 % (ref 11.5–15.5)
Smear Review: NORMAL
WBC: 9.5 10*3/uL (ref 4.0–10.5)
nRBC: 0 % (ref 0.0–0.2)

## 2021-09-23 LAB — COMPREHENSIVE METABOLIC PANEL
ALT: 18 U/L (ref 0–44)
AST: 23 U/L (ref 15–41)
Albumin: 3.6 g/dL (ref 3.5–5.0)
Alkaline Phosphatase: 73 U/L (ref 38–126)
Anion gap: 9 (ref 5–15)
BUN: 14 mg/dL (ref 6–20)
CO2: 19 mmol/L — ABNORMAL LOW (ref 22–32)
Calcium: 9.1 mg/dL (ref 8.9–10.3)
Chloride: 105 mmol/L (ref 98–111)
Creatinine, Ser: 1.01 mg/dL (ref 0.61–1.24)
GFR, Estimated: >60 mL/min (ref >60)
Glucose, Bld: 184 mg/dL — ABNORMAL HIGH (ref 70–99)
Potassium: 4.1 mmol/L (ref 3.5–5.1)
Sodium: 133 mmol/L — ABNORMAL LOW (ref 135–145)
Total Bilirubin: 0.6 mg/dL (ref 0.3–1.2)
Total Protein: 7 g/dL (ref 6.5–8.1)

## 2021-09-23 LAB — TROPONIN I (HIGH SENSITIVITY)
Troponin I (High Sensitivity): 3 ng/L (ref <18)
Troponin I (High Sensitivity): 3 ng/L (ref <18)

## 2021-09-23 LAB — LIPASE, BLOOD: Lipase: 44 U/L (ref 11–51)

## 2021-09-23 MED ORDER — LORAZEPAM 2 MG/ML IJ SOLN: 1.0000 mg | Freq: Once | INTRAMUSCULAR | Status: AC

## 2021-09-23 MED ORDER — IOHEXOL 350 MG/ML SOLN
100.0000 mL | Freq: Once | INTRAVENOUS | Status: AC | PRN
  Administered 2021-09-23: 100 mL via INTRAVENOUS

## 2021-09-23 MED ORDER — LORAZEPAM 2 MG/ML IJ SOLN
INTRAMUSCULAR | Status: AC
  Administered 2021-09-23: 1 mg via INTRAVENOUS
  Filled 2021-09-23: qty 1

## 2021-09-23 NOTE — Discharge Instructions (Addendum)
There was a nodule in your lung.  This is something that your family doctor may choose to reimage.  Please let them know so that could be followed up.  You also had a prominent hemorrhoid on CT scan.  This is also something that might be followed up by her family doctor if not improving may refer you to a general surgeon.  Take 4 over the counter ibuprofen tablets 3 times a day or 2 over-the-counter naproxen tablets twice a day for pain. Also take tylenol 1000mg (2 extra strength) four times a day.

## 2021-09-23 NOTE — ED Notes (Signed)
Pt discharged to home, NAD noted. Bilateral Ivs removed, pressure dressing plaxced

## 2021-09-23 NOTE — ED Triage Notes (Signed)
Pt POV c/o chest pain, ShOB that started appx 20 min PTA to ED. Pt reports he was walking into work when he felt faint, had to sit down. Pt to ED room tachypneic, in distress.  EDP Floyd to bedside.

## 2021-09-23 NOTE — ED Provider Notes (Signed)
[facility] EMERGENCY DEPARTMENT Provider Note   CSN: [id number] Arrival date & time: 09/23/21  1613     History  Chief Complaint  Patient presents with   Chest Pain   Shortness of Breath    Terry Mckinney is a 61 y.o. male.  61 yo M with a chief complaint of chest pain and difficulty breathing.  This been going on for about 3 days now.  The pain seems to come and go.  Is left-sided and pinpoint.  Described as sharp.  Feeling it radiates to his left arm.  Denies trauma has been coughing quite a bit for the past couple days.  No known sick contacts.        Home Medications Prior to Admission medications   Medication Sig Start Date End Date Taking? Authorizing Provider  cyclobenzaprine (FLEXERIL) 10 MG tablet Take 1 tablet (10 mg total) by mouth 2 (two) times daily as needed for muscle spasms. 03/07/20   Fayrene Helper, PA-C  ibuprofen (ADVIL) 600 MG tablet Take 1 tablet (600 mg total) by mouth every 6 (six) hours as needed. 03/07/20   Fayrene Helper, PA-C  lidocaine (LIDODERM) 5 % Place 1 patch onto the skin daily. Remove & Discard patch within 12 hours or as directed by MD Patient not taking: No sig reported 01/15/20   Sabas Sous, MD  Liniments (BEN GAY EX) Apply 1 application topically daily as needed (muscle aches).    [provider]  nabumetone (RELAFEN) 750 MG tablet Take 750 mg by mouth 2 (two) times daily.    [provider]  predniSONE (STERAPRED UNI-PAK 21 TAB) 10 MG (21) TBPK tablet Take by mouth daily. Take 6 tablets on day 1, 5 tablets on day 2, 4 tablets on day 3, 3 tablets on day 4, 2 tablets on day 5, and 1 tablet on day 6 03/07/20   Fayrene Helper, PA-C      Allergies    Naproxen    Review of Systems   Review of Systems  Physical Exam Updated Vital Signs BP 114/75   Pulse (!) 58   Temp 98.1 F (36.7 C)   Resp 18   SpO2 92%  Physical Exam Vitals and nursing note reviewed.  Constitutional:      Appearance: He is well-developed.   HENT:     Head: Normocephalic and atraumatic.  Eyes:     Pupils: Pupils are equal, round, and reactive to light.  Neck:     Vascular: No JVD.  Cardiovascular:     Rate and Rhythm: Normal rate and regular rhythm.     Heart sounds: No murmur heard.    No friction rub. No gallop.  Pulmonary:     Effort: No respiratory distress.     Breath sounds: No wheezing.  Abdominal:     General: There is no distension.     Tenderness: There is no abdominal tenderness. There is no guarding or rebound.  Musculoskeletal:        General: Normal range of motion.     Cervical back: Normal range of motion and neck supple.  Skin:    Coloration: Skin is not pale.     Findings: No rash.  Neurological:     Mental Status: He is alert and oriented to person, place, and time.  Psychiatric:        Behavior: Behavior normal.     ED Results / Procedures / Treatments   Labs (all labs ordered are listed, but only  abnormal results are displayed) Labs Reviewed  CBC WITH DIFFERENTIAL/PLATELET - Abnormal; Notable for the following components:      Result Value   MCHC 38.5 (*)    All other components within normal limits  COMPREHENSIVE METABOLIC PANEL - Abnormal; Notable for the following components:   Sodium 133 (*)    CO2 19 (*)    Glucose, Bld 184 (*)    All other components within normal limits  LIPASE, BLOOD  TROPONIN I (HIGH SENSITIVITY)  TROPONIN I (HIGH SENSITIVITY)    EKG EKG Interpretation  Date/Time:  Thursday September 23 2021 16:23:11 EDT Ventricular Rate:  68 PR Interval:  193 QRS Duration: 88 QT Interval:  363 QTC Calculation: 386 R Axis:   63 Text Interpretation: Sinus rhythm No significant change since last tracing Confirmed by Melene Plan [phone]) on 09/23/2021 4:28:31 PM  Radiology CT Angio Chest/Abd/Pel for Dissection W and/or Wo Contrast  Result Date: 09/23/2021 CLINICAL DATA:  Chest or back pain, aortic dissection suspected. EXAM: CT ANGIOGRAPHY CHEST, ABDOMEN AND PELVIS  TECHNIQUE: Non-contrast CT of the chest was initially obtained. Multidetector CT imaging through the chest, abdomen and pelvis was performed using the standard protocol during bolus administration of intravenous contrast. Multiplanar reconstructed images and MIPs were obtained and reviewed to evaluate the vascular anatomy. RADIATION DOSE REDUCTION: This exam was performed according to the departmental dose-optimization program which includes automated exposure control, adjustment of the mA and/or kV according to patient size and/or use of iterative reconstruction technique. CONTRAST:  OMNIPAQUE IOHEXOL 350 MG/ML SOLN COMPARISON:  None Available. FINDINGS: CTA CHEST FINDINGS Cardiovascular: No evidence of intramural hematoma on noncontrast sequence. Preferential opacification of the thoracic aorta postcontrast administration, without evidence of thoracic aortic aneurysm or dissection. No central pulmonary embolus on this nondedicated study. Normal heart size. Coronary artery calcifications. No significant pericardial effusion/thickening. Mediastinum/Nodes: No supraclavicular adenopathy. No suspicious thyroid nodule. No pathologically enlarged mediastinal, hilar or axillary lymph nodes. The esophagus is grossly unremarkable. Lungs/Pleura: Solid 3 mm left upper lobe pulmonary nodule on image 36/7. Hypoventilatory change in the dependent lungs. No pleural effusion. No pneumothorax. Musculoskeletal: Multilevel degenerative change of the cervical and thoracic spine. No acute osseous abnormality. Review of the MIP images confirms the above findings. CTA ABDOMEN AND PELVIS FINDINGS VASCULAR Aorta: Aortic atherosclerosis. Normal caliber aorta without aneurysm, dissection, vasculitis or significant stenosis. Common origin of the celiac artery and SMA, anatomic variant: Patent without evidence of aneurysm, dissection, vasculitis or significant stenosis. Renals: Renal arteries are patent without evidence of aneurysm,  dissection, vasculitis, fibromuscular dysplasia or significant stenosis. IMA: Patent without evidence of aneurysm, dissection, vasculitis or significant stenosis. Inflow: Patent without evidence of aneurysm, dissection, vasculitis or significant stenosis. Veins: No obvious venous abnormality within the limitations of this arterial phase study. Review of the MIP images confirms the above findings. NON-VASCULAR Hepatobiliary: No suspicious hepatic lesion. Cholelithiasis without findings of acute cholecystitis. No biliary ductal dilation. Pancreas: No pancreatic ductal dilation or evidence of acute inflammation. Spleen: No splenomegaly. Adrenals/Urinary Tract: Bilateral adrenal glands appear normal. No hydronephrosis. Kidneys demonstrate symmetric enhancement. Urinary bladder is unremarkable for degree of distension. Stomach/Bowel: No radiopaque enteric contrast material was administered. Stomach is minimally distended limiting evaluation. No pathologic dilation of large or small bowel. Appendix and terminal ileum appear normal. Scattered left-sided colonic diverticulosis without findings of acute diverticulitis. Symmetric rectal wall thickening with prominent hemorrhoidal vessels, additionally there is some hyperdense material either within the wall of the rectum or intraluminally for instance on image 200/5 -  210/5. Lymphatic: No pathologically enlarged abdominal or pelvic lymph nodes. Reproductive: Enlarged prostate gland. Other: No significant abdominopelvic free fluid. Musculoskeletal: Lumbar spondylosis with advanced L5-S1 discogenic disease. Degenerative change of the bilateral hips and SI joints. Chronic osseous changes of the pubic symphysis. Review of the MIP images confirms the above findings. IMPRESSION: 1. No evidence of acute aortic syndrome. 2. Symmetric rectal wall thickening with prominent hemorrhoidal vessels and some hyperdense material either within the wall of the rectum or intraluminal,  nonspecific but possibly reflecting hemorrhoidal bleed or an infectious/inflammatory process suggest correlation with physical exam and possible further evaluation with outpatient endoscopy if clinically indicated. 3. Cholelithiasis without findings of acute cholecystitis. 4. 3 mm left solid pulmonary nodule within the upper lobe. Per Fleischner Society Guidelines, if patient is low risk for malignancy, no routine follow-up imaging is recommended; if patient is high risk for malignancy, a non-contrast Chest CT at 12 months is optional. If performed and the nodule is stable at 12 months, no further follow-up is recommended. These guidelines do not apply to immunocompromised patients and patients with cancer. Follow up in patients with significant comorbidities as clinically warranted. For lung cancer screening, adhere to Lung-RADS guidelines. Reference: Radiology. 2017; 284(1):228-43. 5.  Aortic Atherosclerosis (ICD10-I70.0). Electronically Signed   By: Maudry Mayhew M.D.   On: 09/23/2021 18:17   DG Chest Port 1 View  Result Date: 09/23/2021 CLINICAL DATA:  Chest pain, show shortness of breath, felt faint, had to sit down, tachypneic, history asthma, smoking EXAM: PORTABLE CHEST 1 VIEW COMPARISON:  Portable exam 1627 hours compared to 09/21/2021 FINDINGS: Normal heart size, mediastinal contours, and pulmonary vascularity. Lungs clear. No pulmonary infiltrate, pleural effusion, or pneumothorax. Osseous structures unremarkable. IMPRESSION: No acute abnormalities. Electronically Signed   By: Ulyses Southward M.D.   On: 09/23/2021 16:57    Procedures Procedures    Medications Ordered in ED Medications  LORazepam (ATIVAN) injection 1 mg (1 mg Intravenous Given 09/23/21 1629)  iohexol (OMNIPAQUE) 350 MG/ML injection 100 mL (100 mLs Intravenous Contrast Given 09/23/21 1759)    ED Course/ Medical Decision Making/ A&P                           Medical Decision Making Amount and/or Complexity of Data Reviewed Labs:  ordered. Radiology: ordered. ECG/medicine tests: ordered.  Risk Prescription drug management.   61 yo M with a cc of chest pain and difficulty breathing.  Going on for three days.  The patient has presented quite dramatically.  He has a normal respiratory rate and normal heart rates and clear lung sounds.  I am not sure why he is thrashing around on the bed and refused to get up and get onto the bed.  I must consider aortic dissection is a possibility with the amount of pain that he is describing.  We will start with a chest x-ray blood work.  Give him a dose of Ativan.  Reassess.  CT angiogram of the chest is negative for large PE or dissection.  He has 2 troponins that are negative.  He is feeling better after Ativan.  Discharge home.  PCP follow-up.  7:48 PM:  I have discussed the diagnosis/risks/treatment options with the patient.  Evaluation and diagnostic testing in the emergency department does not suggest an emergent condition requiring admission or immediate intervention beyond what has been performed at this time.  They will follow up with PCP. We also discussed returning to the ED immediately  if new or worsening sx occur. We discussed the sx which are most concerning (e.g., sudden worsening pain, fever, inability to tolerate by mouth) that necessitate immediate return. Medications administered to the patient during their visit and any new prescriptions provided to the patient are listed below.  Medications given during this visit Medications  LORazepam (ATIVAN) injection 1 mg (1 mg Intravenous Given 09/23/21 1629)  iohexol (OMNIPAQUE) 350 MG/ML injection 100 mL (100 mLs Intravenous Contrast Given 09/23/21 1759)     The patient appears reasonably screen and/or stabilized for discharge and I doubt any other medical condition or other [facility] requiring further screening, evaluation, or treatment in the ED at this time prior to discharge.          Final Clinical Impression(s) / ED  Diagnoses Final diagnoses:  Nonspecific chest pain    Rx / DC Orders ED Discharge Orders     None         Melene Plan, DO 09/23/21 1948

## 2021-09-27 ENCOUNTER — Encounter (HOSPITAL_BASED_OUTPATIENT_CLINIC_OR_DEPARTMENT_OTHER): Payer: Self-pay | Admitting: Emergency Medicine

## 2021-09-27 ENCOUNTER — Emergency Department (HOSPITAL_BASED_OUTPATIENT_CLINIC_OR_DEPARTMENT_OTHER)
Admission: EM | Admit: 2021-09-27 | Discharge: 2021-09-27 | Disposition: A | Discharge status: Home / Self Care | Payer: BC Managed Care – PPO | Attending: Emergency Medicine | Admitting: Emergency Medicine

## 2021-09-27 ENCOUNTER — Emergency Department (HOSPITAL_BASED_OUTPATIENT_CLINIC_OR_DEPARTMENT_OTHER): Payer: BC Managed Care – PPO

## 2021-09-27 DIAGNOSIS — T391X1A Poisoning by 4-Aminophenol derivatives, accidental (unintentional), initial encounter: Secondary | ICD-10-CM

## 2021-09-27 DIAGNOSIS — R002 Palpitations: Secondary | ICD-10-CM | POA: Diagnosis not present

## 2021-09-27 DIAGNOSIS — F419 Anxiety disorder, unspecified: Secondary | ICD-10-CM | POA: Insufficient documentation

## 2021-09-27 DIAGNOSIS — R062 Wheezing: Secondary | ICD-10-CM | POA: Insufficient documentation

## 2021-09-27 DIAGNOSIS — R0602 Shortness of breath: Secondary | ICD-10-CM | POA: Diagnosis present

## 2021-09-27 DIAGNOSIS — R059 Cough, unspecified: Secondary | ICD-10-CM | POA: Diagnosis not present

## 2021-09-27 LAB — COMPREHENSIVE METABOLIC PANEL
ALT: 21 U/L (ref 0–44)
ALT: 17 U/L (ref 0–44)
AST: 26 U/L (ref 15–41)
AST: 22 U/L (ref 15–41)
Albumin: 3.9 g/dL (ref 3.5–5.0)
Albumin: 3.5 g/dL (ref 3.5–5.0)
Alkaline Phosphatase: 67 U/L (ref 38–126)
Alkaline Phosphatase: 65 U/L (ref 38–126)
Anion gap: 9 (ref 5–15)
Anion gap: 7 (ref 5–15)
BUN: 17 mg/dL (ref 6–20)
BUN: 15 mg/dL (ref 6–20)
CO2: 20 mmol/L — ABNORMAL LOW (ref 22–32)
CO2: 23 mmol/L (ref 22–32)
Calcium: 9 mg/dL (ref 8.9–10.3)
Calcium: 8.2 mg/dL — ABNORMAL LOW (ref 8.9–10.3)
Chloride: 106 mmol/L (ref 98–111)
Chloride: 108 mmol/L (ref 98–111)
Creatinine, Ser: 1.11 mg/dL (ref 0.61–1.24)
Creatinine, Ser: 1.03 mg/dL (ref 0.61–1.24)
GFR, Estimated: >60 mL/min (ref >60)
GFR, Estimated: >60 mL/min (ref >60)
Glucose, Bld: 120 mg/dL — ABNORMAL HIGH (ref 70–99)
Glucose, Bld: 103 mg/dL — ABNORMAL HIGH (ref 70–99)
Potassium: 3.8 mmol/L (ref 3.5–5.1)
Potassium: 3.5 mmol/L (ref 3.5–5.1)
Sodium: 135 mmol/L (ref 135–145)
Sodium: 138 mmol/L (ref 135–145)
Total Bilirubin: 1.1 mg/dL (ref 0.3–1.2)
Total Bilirubin: 0.7 mg/dL (ref 0.3–1.2)
Total Protein: 7.8 g/dL (ref 6.5–8.1)
Total Protein: 6.8 g/dL (ref 6.5–8.1)

## 2021-09-27 LAB — CBC
HCT: 45.5 % (ref 39.0–52.0)
Hemoglobin: 17.2 g/dL — ABNORMAL HIGH (ref 13.0–17.0)
MCH: 31.6 pg (ref 26.0–34.0)
MCHC: 37.8 g/dL — ABNORMAL HIGH (ref 30.0–36.0)
MCV: 83.5 fL (ref 80.0–100.0)
Platelets: 310 10*3/uL (ref 150–400)
RBC: 5.45 MIL/uL (ref 4.22–5.81)
RDW: 12.8 % (ref 11.5–15.5)
WBC: 8.3 10*3/uL (ref 4.0–10.5)
nRBC: 0 % (ref 0.0–0.2)

## 2021-09-27 LAB — ACETAMINOPHEN LEVEL
Acetaminophen (Tylenol), Serum: 16 ug/mL (ref 10–30)
Acetaminophen (Tylenol), Serum: <10 ug/mL — ABNORMAL LOW (ref 10–30)

## 2021-09-27 MED ORDER — LORAZEPAM 1 MG PO TABS
1.0000 mg | ORAL_TABLET | Freq: Once | ORAL | Status: AC
  Administered 2021-09-27: 1 mg via ORAL
  Filled 2021-09-27: qty 1

## 2021-09-27 MED ORDER — SODIUM CHLORIDE 0.9 % IV BOLUS
1000.0000 mL | Freq: Once | INTRAVENOUS | Status: AC
  Administered 2021-09-27: 1000 mL via INTRAVENOUS

## 2021-09-27 MED ORDER — METHYLPREDNISOLONE SODIUM SUCC 125 MG IJ SOLR
125.0000 mg | Freq: Once | INTRAMUSCULAR | Status: AC
  Administered 2021-09-27: 125 mg via INTRAVENOUS
  Filled 2021-09-27: qty 2

## 2021-09-27 MED ORDER — ALBUTEROL SULFATE HFA 108 (90 BASE) MCG/ACT IN AERS: 2.0000 | INHALATION_SPRAY | Freq: Four times a day (QID) | RESPIRATORY_TRACT | Status: DC | PRN

## 2021-09-27 MED ORDER — LORAZEPAM 1 MG PO TABS: 1.0000 mg | ORAL_TABLET | Freq: Three times a day (TID) | ORAL | 0 refills | Status: AC | PRN

## 2021-09-27 MED ORDER — PREDNISONE 50 MG PO TABS: 50.0000 mg | ORAL_TABLET | Freq: Every day | ORAL | 0 refills | Status: AC

## 2021-09-27 MED ORDER — ALBUTEROL SULFATE HFA 108 (90 BASE) MCG/ACT IN AERS
4.0000 | INHALATION_SPRAY | Freq: Once | RESPIRATORY_TRACT | Status: AC
  Administered 2021-09-27: 4 via RESPIRATORY_TRACT
  Filled 2021-09-27: qty 6.7

## 2021-09-27 MED ORDER — ONDANSETRON HCL 4 MG/2ML IJ SOLN
4.0000 mg | Freq: Once | INTRAMUSCULAR | Status: AC
  Administered 2021-09-27: 4 mg via INTRAVENOUS
  Filled 2021-09-27: qty 2

## 2021-09-27 NOTE — Discharge Instructions (Signed)
Your history, exam and work-up today revealed wheezing and shortness of breath and I suspect you have a degree of reactive airway disease or bronchospasm/asthma that has been flared up by the exposure to the chemicals with the Clorox as well as environmental changes.  Please use the inhaler you were provided and use the burst of steroids starting tomorrow for the next few days to help.  In regards the Tylenol overdose, we spoke with poison control who feels you are safe for discharge home giving improving labs.  Please avoid taking Tylenol and rest and stay hydrated.  Please follow-up with your primary doctor for further management of your anxiety.  if any symptoms change or worsen acutely, please return to the nearest emergency department.

## 2021-09-27 NOTE — ED Provider Notes (Signed)
[facility] EMERGENCY DEPARTMENT Provider Note   CSN: [id number] Arrival date & time: 09/27/21  1508     History  Chief Complaint  Patient presents with   Ingestion   Palpitations    Terry Mckinney is a 61 y.o. male.  The history is provided by the patient and medical records. No language interpreter was used.  Ingestion This is a new problem. The current episode started yesterday. The problem occurs constantly. The problem has not changed since onset.Associated symptoms include shortness of breath. Pertinent negatives include no chest pain, no abdominal pain and no headaches. Nothing (coiughing) aggravates the symptoms. Nothing relieves the symptoms. He has tried acetaminophen for the symptoms. The treatment provided no relief.  Palpitations Associated symptoms: cough and shortness of breath   Associated symptoms: no back pain, no chest pain, no dizziness, no nausea, no numbness, no vomiting and no weakness        Home Medications Prior to Admission medications   Medication Sig Start Date End Date Taking? Authorizing Provider  cyclobenzaprine (FLEXERIL) 10 MG tablet Take 1 tablet (10 mg total) by mouth 2 (two) times daily as needed for muscle spasms. 03/07/20   Fayrene Helper, PA-C  ibuprofen (ADVIL) 600 MG tablet Take 1 tablet (600 mg total) by mouth every 6 (six) hours as needed. 03/07/20   Fayrene Helper, PA-C  lidocaine (LIDODERM) 5 % Place 1 patch onto the skin daily. Remove & Discard patch within 12 hours or as directed by MD Patient not taking: No sig reported 01/15/20   Sabas Sous, MD  Liniments (BEN GAY EX) Apply 1 application topically daily as needed (muscle aches).    [provider]  nabumetone (RELAFEN) 750 MG tablet Take 750 mg by mouth 2 (two) times daily.    [provider]  predniSONE (STERAPRED UNI-PAK 21 TAB) 10 MG (21) TBPK tablet Take by mouth daily. Take 6 tablets on day 1, 5 tablets on day 2, 4 tablets on day 3, 3 tablets on day 4,  2 tablets on day 5, and 1 tablet on day 6 03/07/20   Fayrene Helper, PA-C      Allergies    Naproxen    Review of Systems   Review of Systems  Constitutional:  Negative for chills, fatigue and fever.  HENT:  Positive for congestion.   Respiratory:  Positive for cough, chest tightness, shortness of breath and wheezing.   Cardiovascular:  Positive for palpitations. Negative for chest pain and leg swelling.  Gastrointestinal:  Positive for diarrhea. Negative for abdominal pain, constipation, nausea and vomiting.  Genitourinary:  Negative for flank pain.  Musculoskeletal:  Negative for back pain, neck pain and neck stiffness.  Skin:  Negative for rash and wound.  Neurological:  Negative for dizziness, seizures, weakness, light-headedness, numbness and headaches.  Psychiatric/Behavioral:  Negative for agitation and confusion. The patient is nervous/anxious.   All other systems reviewed and are negative.   Physical Exam Updated Vital Signs BP 120/89   Pulse 63   Temp 98 F (36.7 C) (Oral)   Resp 18   Ht 5\' 9"  (1.753 m)   Wt 87 kg   SpO2 100%   BMI 28.32 kg/m  Physical Exam Vitals and nursing note reviewed.  Constitutional:      General: He is not in acute distress.    Appearance: He is well-developed. He is not ill-appearing, toxic-appearing or diaphoretic.  HENT:     Head: Normocephalic and atraumatic.     Nose: Nose  normal.     Mouth/Throat:     Mouth: Mucous membranes are dry.     Pharynx: No oropharyngeal exudate or posterior oropharyngeal erythema.  Eyes:     Extraocular Movements: Extraocular movements intact.     Conjunctiva/sclera: Conjunctivae normal.     Pupils: Pupils are equal, round, and reactive to light.  Cardiovascular:     Rate and Rhythm: Normal rate and regular rhythm.     Heart sounds: No murmur heard. Pulmonary:     Effort: Pulmonary effort is normal. No respiratory distress.     Breath sounds: Wheezing present. No rhonchi or rales.  Chest:     Chest  wall: No tenderness.  Abdominal:     General: There is no distension.     Palpations: Abdomen is soft.     Tenderness: There is no abdominal tenderness.  Musculoskeletal:        General: No swelling or tenderness.     Cervical back: Neck supple. No tenderness.     Right lower leg: No edema.     Left lower leg: No edema.  Skin:    General: Skin is warm and dry.     Capillary Refill: Capillary refill takes less than 2 seconds.     Findings: No rash.  Neurological:     General: No focal deficit present.     Mental Status: He is alert.     Sensory: No sensory deficit.     Motor: No weakness.  Psychiatric:        Mood and Affect: Mood normal.     ED Results / Procedures / Treatments   Labs (all labs ordered are listed, but only abnormal results are displayed) Labs Reviewed  COMPREHENSIVE METABOLIC PANEL - Abnormal; Notable for the following components:      Result Value   CO2 20 (*)    Glucose, Bld 120 (*)    All other components within normal limits  CBC - Abnormal; Notable for the following components:   Hemoglobin 17.2 (*)    MCHC 37.8 (*)    All other components within normal limits  ACETAMINOPHEN LEVEL - Abnormal; Notable for the following components:   Acetaminophen (Tylenol), Serum <10 (*)    All other components within normal limits  COMPREHENSIVE METABOLIC PANEL - Abnormal; Notable for the following components:   Glucose, Bld 103 (*)    Calcium 8.2 (*)    All other components within normal limits  ACETAMINOPHEN LEVEL    EKG EKG Interpretation  Date/Time:  Monday September 27 2021 15:18:32 EDT Ventricular Rate:  65 PR Interval:  188 QRS Duration: 88 QT Interval:  372 QTC Calculation: 386 R Axis:   46 Text Interpretation: Normal sinus rhythm Normal ECG When compared with ECG of 23-Sep-2021 16:23, PREVIOUS ECG IS PRESENT when compared to prior, similar appearance. No STEMI Confirmed by Theda Belfast ([id number]) on 09/27/2021 4:10:17 PM  Radiology DG Chest  Portable 1 View  Result Date: 09/27/2021 CLINICAL DATA:  Cough wheezing EXAM: PORTABLE CHEST 1 VIEW COMPARISON:  09/23/2021 FINDINGS: The heart size and mediastinal contours are within normal limits. Both lungs are clear. The visualized skeletal structures are unremarkable. IMPRESSION: No active disease. Electronically Signed   By: Jasmine Pang M.D.   On: 09/27/2021 17:32    Procedures Procedures    Medications Ordered in ED Medications  albuterol (VENTOLIN HFA) 108 (90 Base) MCG/ACT inhaler 2 puff (has no administration in time range)  albuterol (VENTOLIN HFA) 108 (90 Base) MCG/ACT inhaler 4  puff (4 puffs Inhalation Given 09/27/21 1725)  methylPREDNISolone sodium succinate (SOLU-MEDROL) 125 mg/2 mL injection 125 mg (125 mg Intravenous Given 09/27/21 1755)  sodium chloride 0.9 % bolus 1,000 mL (0 mLs Intravenous Stopped 09/27/21 1932)  LORazepam (ATIVAN) tablet 1 mg (1 mg Oral Given 09/27/21 1755)  ondansetron (ZOFRAN) injection 4 mg (4 mg Intravenous Given 09/27/21 1805)    ED Course/ Medical Decision Making/ A&P                           Medical Decision Making Amount and/or Complexity of Data Reviewed Labs: ordered. Radiology: ordered.  Risk Prescription drug management.    Shadrach Bartunek is a 61 y.o. male with a past medical history significant for asthma who presents with reported panic attacks, wheezing, shortness of breath, and concern for accidental overdose.  According to patient, he has had shortness of breath for the last week or so and was evaluated extensively 3 days ago including a full dissection study that did not show evidence of dissection pulmonary malaise of pneumonia or other critical abnormality.  Patient says that after discharge she still had episodes of wheezing and shortness of breath and is panicking about it.  He said that starting yesterday afternoon, he started taking Tylenol as he thought that might help and has taken approximately 30 tablets of 650 mg  Tylenol.  He reportedly took some just prior to arrival to the emergency department this afternoon.  He is reporting no significant headache, neck pain, chest pain, or abdominal pain.  He just having the wheezing and shortness of breath and anxiousness.  Denies any nausea or vomiting and denies any constipation or urinary changes.  He reports some diarrhea for the last few weeks.  H  denies other complaints on arrival.  On exam, lungs have wheezing throughout.  Patient was coughing.  Chest and abdomen were nontender.  Back nontender.  No focal neurologic deficits.  Patient appears anxious.  Exam otherwise unremarkable.  Clinically I suspect patient has asthma exacerbation with his wheezing and shortness of breath description of symptoms.  We will give albuterol and some steroids.  Will give him some fluids for dry mucous membranes he has on exam.  I am also concerned about the amount of Tylenol patient has reportedly ingested over the last 24 hours.  According to him, he has taken 19.5 g of Tylenol over this time..  Patient was seen in triage and had some screening labs including a Tylenol level that was found to be 16.  I quickly called poison control and they requested we get a 4-hour level compared to the 16 which will need to be drawn at 7:30 PM this evening.  If Tylenol level is stable or downtrending, they have low suspicion he has any Tylenol poisoning or needing other medications as his liver function was normal initially as well.  They want to repeat CMP.  Otherwise, we will get chest x-ray to rule out pneumonia and get other screening labs.  We will give some fluids and a pill of Ativan to help with his anxiousness.  Anticipate reassessment after work-up to determine disposition.       9:03 PM Repeat Tylenol level and metabolic panel are reassuring.  Tylenol is nondetectable.  Based on the recommendations for poison control, if this was improving and he remains well-appearing he is safe for  discharge.  Patient is still feeling anxious and said that the Ativan helped him previously  so we will give a short course of several pills and he will follow-up with his PCP this week.  Patient is otherwise feeling better and would like to go home.  We will also send in prescription for several days of steroids to help with the wheezing and he has the inhaler to take home with him.  He will follow-up with PCP for this as well.  Patient discharged in good condition with improving symptoms.         Final Clinical Impression(s) / ED Diagnoses Final diagnoses:  Anxiety  Tylenol ingestion, accidental or unintentional, initial encounter  Wheezing  Shortness of breath    Rx / DC Orders ED Discharge Orders          Ordered    predniSONE (DELTASONE) 50 MG tablet  Daily        09/27/21 2117    LORazepam (ATIVAN) 1 MG tablet  3 times daily PRN        09/27/21 2117           Clinical Impression: 1. Anxiety   2. Tylenol ingestion, accidental or unintentional, initial encounter   3. Wheezing   4. Shortness of breath     Disposition: Discharge  Condition: Good  I have discussed the results, Dx and Tx plan with the pt(& family if present). He/she/they expressed understanding and agree(s) with the plan. Discharge instructions discussed at great length. Strict return precautions discussed and pt &/or family have verbalized understanding of the instructions. No further questions at time of discharge.    New Prescriptions   LORAZEPAM (ATIVAN) 1 MG TABLET    Take 1 tablet (1 mg total) by mouth 3 (three) times daily as needed for anxiety.   PREDNISONE (DELTASONE) 50 MG TABLET    Take 1 tablet (50 mg total) by mouth daily for 5 days.    Follow Up: [facility] [address] [place] [address] [place] [address] [phone] Schedule an appointment as soon as possible for a visit    [facility] [address] [id number] TI RWER [place] [address] [phone]         Angelica Frandsen, Canary Brim, MD 09/27/21 2119

## 2021-09-27 NOTE — ED Triage Notes (Signed)
Pt states he has chronic arthritis and could not find relief from pain. Pt preceded to take multiple types of medication. Reports taking multiple BC powders and 1/2 bottle of tylenol since yesterday evening. Left arm tingling and palpitation started today. Also reports continued shob since he was last seen on 9/7.

## 2021-09-28 ENCOUNTER — Other Ambulatory Visit (HOSPITAL_BASED_OUTPATIENT_CLINIC_OR_DEPARTMENT_OTHER): Payer: Self-pay

## 2021-11-01 NOTE — Progress Notes (Unsigned)
Synopsis: Referred for abnormal CT Chest, dyspnea by Filomena Jungling, NP  Subjective:   PATIENT ID: Terry Mckinney GENDER: male DOB: 05-05-1960, MRN: [id number]  No chief complaint on file.  61yM with history of asthma, smoking referred for dyspnea and abnormal CT with incidentally discovered 29mm LUL nodule   Otherwise pertinent review of systems is negative.  Past Medical History:  Diagnosis Date   Asthma    Phobia      Family History  Problem Relation Age of Onset   Diabetes Other    Hypertension Other    Diabetes Mother      Past Surgical History:  Procedure Laterality Date   ABDOMINAL SURGERY     I & D EXTREMITY Right 11/07/2013   Procedure: IRRIGATION AND DEBRIDEMENT EXTREMITY;  Surgeon: Betha Loa, MD;  Location: [facility] OR;  Service: Orthopedics;  Laterality: Right;   KNEE SURGERY      Social History   Socioeconomic History   Marital status: Single    Spouse name: Not on file   Number of children: Not on file   Years of education: Not on file   Highest education level: Not on file  Occupational History   Not on file  Tobacco Use   Smoking status: Every Day    Packs/day: 1.00    Types: Cigars, Cigarettes   Smokeless tobacco: Never  Vaping Use   Vaping Use: Never used  Substance and Sexual Activity   Alcohol use: Yes    Comment: socially   Drug use: No   Sexual activity: Not on file  Other Topics Concern   Not on file  Social History Narrative   Not on file   Social Determinants of Health   Financial Resource Strain: Not on file  Food Insecurity: Not on file  Transportation Needs: Not on file  Physical Activity: Not on file  Stress: Not on file  Social Connections: Not on file  Intimate Partner Violence: Not on file     Allergies  Allergen Reactions   Naproxen Anaphylaxis, Shortness Of Breath and Swelling    Tongue and throat swelling      Outpatient Medications Prior to Visit  Medication Sig Dispense Refill   cyclobenzaprine (FLEXERIL)  10 MG tablet Take 1 tablet (10 mg total) by mouth 2 (two) times daily as needed for muscle spasms. 20 tablet 0   ibuprofen (ADVIL) 600 MG tablet Take 1 tablet (600 mg total) by mouth every 6 (six) hours as needed. 30 tablet 0   lidocaine (LIDODERM) 5 % Place 1 patch onto the skin daily. Remove & Discard patch within 12 hours or as directed by MD (Patient not taking: No sig reported) 5 patch 0   Liniments (BEN GAY EX) Apply 1 application topically daily as needed (muscle aches).     LORazepam (ATIVAN) 1 MG tablet Take 1 tablet (1 mg total) by mouth 3 (three) times daily as needed for anxiety. 15 tablet 0   nabumetone (RELAFEN) 750 MG tablet Take 750 mg by mouth 2 (two) times daily.     predniSONE (STERAPRED UNI-PAK 21 TAB) 10 MG (21) TBPK tablet Take by mouth daily. Take 6 tablets on day 1, 5 tablets on day 2, 4 tablets on day 3, 3 tablets on day 4, 2 tablets on day 5, and 1 tablet on day 6 21 tablet 0   No facility-administered medications prior to visit.       Objective:   Physical Exam:  General appearance: 61 y.o., male,  NAD, conversant  Eyes: anicteric sclerae; PERRL, tracking appropriately HENT: NCAT; MMM Neck: Trachea midline; no lymphadenopathy, no JVD Lungs: CTAB, no crackles, no wheeze, with normal respiratory effort CV: RRR, no murmur  Abdomen: Soft, non-tender; non-distended, BS present  Extremities: No peripheral edema, warm Skin: Normal turgor and texture; no rash Psych: Appropriate affect Neuro: Alert and oriented to person and place, no focal deficit     There were no vitals filed for this visit.   on *** LPM *** RA BMI Readings from Last 3 Encounters:  09/27/21 28.32 kg/m  09/21/21 28.21 kg/m  03/15/20 28.80 kg/m   Wt Readings from Last 3 Encounters:  09/27/21 191 lb 12.8 oz (87 kg)  09/21/21 191 lb (86.6 kg)  03/15/20 195 lb (88.5 kg)     CBC    Component Value Date/Time   WBC 8.3 09/27/2021 1526   RBC 5.45 09/27/2021 1526   HGB 17.2 (H)  09/27/2021 1526   HCT 45.5 09/27/2021 1526   PLT 310 09/27/2021 1526   MCV 83.5 09/27/2021 1526   MCH 31.6 09/27/2021 1526   MCHC 37.8 (H) 09/27/2021 1526   RDW 12.8 09/27/2021 1526   LYMPHSABS 2.8 09/23/2021 1624   MONOABS 0.5 09/23/2021 1624   EOSABS 0.3 09/23/2021 1624   BASOSABS 0.1 09/23/2021 1624    ***  Chest Imaging: CTA CAP 9/7 reviewed by me unremarkable save for 3mm LUL nodule  Pulmonary Functions Testing Results:     No data to display            Echocardiogram: ***  Heart Catheterization: ***    Assessment & Plan:    Plan:      Maryjane Hurter, MD [facility] 11/01/2021 4:42 PM

## 2021-11-02 ENCOUNTER — Ambulatory Visit (INDEPENDENT_AMBULATORY_CARE_PROVIDER_SITE_OTHER): Payer: BC Managed Care – PPO | Admitting: Student

## 2021-11-02 ENCOUNTER — Encounter: Payer: Self-pay | Admitting: Student

## 2021-11-02 VITALS — BP 132/72 | HR 63 | Temp 98.2°F | Ht 69.0 in | Wt 184.4 lb

## 2021-11-02 DIAGNOSIS — F17200 Nicotine dependence, unspecified, uncomplicated: Secondary | ICD-10-CM | POA: Diagnosis not present

## 2021-11-02 DIAGNOSIS — R0609 Other forms of dyspnea: Secondary | ICD-10-CM

## 2021-11-02 NOTE — Patient Instructions (Signed)
-   We will schedule PFTs as soon as we can - Referral placed today for lung cancer screening, due September 2024, you will be called for this - see you in 8 weeks or sooner if need be!

## 2021-11-17 ENCOUNTER — Telehealth: Payer: Self-pay | Admitting: Student

## 2021-11-17 NOTE — Telephone Encounter (Signed)
Spoke with pt who needing letter that is already prepared to be faxed to insurance company for short term disability. Darilyn the fax number and claim number are included in original message.

## 2021-11-22 NOTE — Telephone Encounter (Signed)
Pt called stating that more paperwork in regards to his disability had been sent over. Darilyn, please advise if you have received this.

## 2021-11-23 ENCOUNTER — Telehealth: Payer: Self-pay | Admitting: Student

## 2021-11-23 NOTE — Telephone Encounter (Signed)
Received faxed copy of short term disability form - I completed as much of it as I can and have sent it back for Dr. Verlee Monte to review and complete.

## 2021-11-25 NOTE — Telephone Encounter (Signed)
Dr. Thora Lance signed short term disability form and included notes that final diagnosis will not be known until after patient has PFT test on 12/29/21.  I have faxed the form to [employer] at fax# [phone].  Mailed hard copy to patient.

## 2021-12-03 ENCOUNTER — Telehealth: Payer: Self-pay | Admitting: Student

## 2021-12-03 ENCOUNTER — Telehealth: Payer: Self-pay | Admitting: Adult Health

## 2021-12-03 NOTE — Telephone Encounter (Signed)
Rec'd a fax from [employer] on 11/14 asking for 4 questions for additional medical details to support patient's claim of not being able to work.   Received a call today from [employer] case manager, Vernona Rieger, ph# [phone] - incident# [id number].   She said that what they need is documentation of physical findings to correlate the patient's shortness of breath (his o2 sats were 97% on 10/17 and he showed no distress) and the determination by Dr. Thora Lance that the patient should not be working, even with restrictions.  Nothing in the ov notes supports this.  Letter saying patient should not work until after the 12/13 PFT and that plan of treatment will be determined after the PFT is not sufficient.    Patient has an appointment with Rubye Oaks, NP on 11/21 and I will let Tammy know this type of information needs to be included in those ov notes.  I asked Vernona Rieger about a walking test to show patient desat's on exertion and she said that she could not tell us how to diagnose the patient, but she has used walking test info in the past for consideration.  Vernona Rieger stated she will hold the pending case open until 11/22 and asked that I fax her a copy of the ov notes as soon as they are finalized on 11/21.   I have spoken to the patient today and he states he cannot work because he gets so out of breath so easily and he cannot lift anything, even >10 lbs.  He has not received a disability check in several weeks and his rent and utilities are past due.  He has no other income.  He has been out of work since [employer] account [employer].

## 2021-12-03 NOTE — Telephone Encounter (Signed)
And sent to  [facility] life.  [phone]

## 2021-12-06 ENCOUNTER — Ambulatory Visit (INDEPENDENT_AMBULATORY_CARE_PROVIDER_SITE_OTHER): Payer: BC Managed Care – PPO | Admitting: Student

## 2021-12-06 DIAGNOSIS — R0609 Other forms of dyspnea: Secondary | ICD-10-CM

## 2021-12-06 NOTE — Progress Notes (Signed)
Spirometry pre and post done today. 

## 2021-12-07 ENCOUNTER — Ambulatory Visit (INDEPENDENT_AMBULATORY_CARE_PROVIDER_SITE_OTHER): Payer: BC Managed Care – PPO | Admitting: Adult Health

## 2021-12-07 ENCOUNTER — Encounter: Payer: Self-pay | Admitting: Adult Health

## 2021-12-07 VITALS — BP 136/80 | HR 60 | Temp 98.0°F | Ht 69.0 in | Wt 193.0 lb

## 2021-12-07 DIAGNOSIS — J449 Chronic obstructive pulmonary disease, unspecified: Secondary | ICD-10-CM

## 2021-12-07 DIAGNOSIS — J4489 Other specified chronic obstructive pulmonary disease: Secondary | ICD-10-CM

## 2021-12-07 DIAGNOSIS — R0609 Other forms of dyspnea: Secondary | ICD-10-CM

## 2021-12-07 LAB — PULMONARY FUNCTION TEST
FEF 25-75 Post: 3.27 L/sec
FEF 25-75 Pre: 0.92 L/sec
FEF2575-%Change-Post: 254 %
FEF2575-%Pred-Post: 113 %
FEF2575-%Pred-Pre: 31 %
FEV1-%Change-Post: 50 %
FEV1-%Pred-Post: 57 %
FEV1-%Pred-Pre: 37 %
FEV1-Post: 1.97 L
FEV1-Pre: 1.31 L
FEV1FVC-%Change-Post: 20 %
FEV1FVC-%Pred-Pre: 91 %
FEV6-%Change-Post: 26 %
FEV6-%Pred-Post: 53 %
FEV6-%Pred-Pre: 42 %
FEV6-Post: 2.34 L
FEV6-Pre: 1.86 L
FEV6FVC-%Change-Post: 1 %
FEV6FVC-%Pred-Post: 104 %
FEV6FVC-%Pred-Pre: 103 %
FVC-%Change-Post: 25 %
FVC-%Pred-Post: 52 %
FVC-%Pred-Pre: 41 %
FVC-Post: 2.37 L
FVC-Pre: 1.88 L
Post FEV1/FVC ratio: 83 %
Post FEV6/FVC ratio: 100 %
Pre FEV1/FVC ratio: 69 %
Pre FEV6/FVC Ratio: 99 %

## 2021-12-07 MED ORDER — BREZTRI AEROSPHERE 160-9-4.8 MCG/ACT IN AERO: 2.0000 | INHALATION_SPRAY | Freq: Two times a day (BID) | RESPIRATORY_TRACT | 0 refills | Status: DC

## 2021-12-07 NOTE — Assessment & Plan Note (Signed)
COPD with asthma.  Patient has significant reversibility on PFTs consistent with asthma component.  He has a strong smoking history.  Recommend beginning maintenance inhaler.  We will begin with Breztri inhaler twice daily.  Patient education was given.  Sample pack was given with patient education. Patient has been referred to the lung cancer screening program. We will check alpha-1 test today.  Referred to pulmonary rehab. We will have patient follow back up in 4 weeks.  We will see how symptom burden is doing.  Patient is out of work until follow-up visit.  Plan  Patient Instructions  Begin BREZTRI 2 puffs Twice daily, rinse after use.  Albuterol inhaler As needed   Activity as tolerated.  Pulmonary rehab referral.  Labs today.  Out of work for 4 weeks .  Follow up with Dr. Thora Lance in 4 weeks and As needed   Please contact office for sooner follow up if symptoms do not improve or worsen or seek emergency care

## 2021-12-07 NOTE — Progress Notes (Signed)
Had 2 covid vaccines.

## 2021-12-07 NOTE — Progress Notes (Signed)
@Patient  ID: Terry Mckinney, male    DOB: 11/28/1960, 61 y.o.   MRN: [id number]  Chief Complaint  Patient presents with   Follow-up    Referring provider: No ref. provider found  HPI: 61 year old male former smoker seen for pulmonary consult November 02, 2021 for shortness of breath and abnormal CT chest with 3 mm LUL nodule  Medical history significant for childhood asthma  TEST/EVENTS :   12/07/2021 Follow up : Dyspnea and lung nodule Patient returns for 1 month follow-up.  Patient was seen last visit for pulmonary consult for significant shortness of breath.  Patient complained of shortness of breath with activities and decreased activity tolerance.  Patient says he has underlying severe anxiety and when his shortness of breath starts up he gets very anxious.  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  Patient was set up for pulmonary function testing that was completed today that shows significant airway reversibility with moderate to severe airflow obstruction -prebronchodilator FEV1 37%, ratio 69, FVC 41%, postbronchodilator FEV1 at 57%, ratio 83, FVC 52%, positive bronchodilator response (50% change).  Patient says he has intermittent cough and wheezing.  Is not on a maintenance inhaler.  Has an albuterol inhaler that he uses on occasion.  Walk test in the office shows no significant desaturations on room air with ambulation. Disability paper work completed per request for out of work data.     Allergies  Allergen Reactions   Naproxen Anaphylaxis, Shortness Of Breath and Swelling    Tongue and throat swelling      There is no immunization history on file for this patient.  Past Medical History:  Diagnosis Date   Asthma    Phobia     Tobacco History: Social History   Tobacco Use  Smoking Status Former   Packs/day: 1.00   Years: 25.00   Total pack years: 25.00   Types:  Cigars, Cigarettes   Quit date: 08/31/2021   Years since quitting: 0.2  Smokeless Tobacco Never   Counseling given: Not Answered   Outpatient Medications Prior to Visit  Medication Sig Dispense Refill   albuterol (VENTOLIN HFA) 108 (90 Base) MCG/ACT inhaler Inhale 2 puffs into the lungs every 6 (six) hours as needed for wheezing or shortness of breath.     escitalopram (LEXAPRO) 20 MG tablet Take 20 mg by mouth daily.     ibuprofen (ADVIL) 600 MG tablet Take 1 tablet (600 mg total) by mouth every 6 (six) hours as needed. 30 tablet 0   LORazepam (ATIVAN) 1 MG tablet Take 1 tablet (1 mg total) by mouth 3 (three) times daily as needed for anxiety. (Patient not taking: Reported on 11/02/2021) 15 tablet 0   No facility-administered medications prior to visit.     Review of Systems:   Constitutional:   No  weight loss, night sweats,  Fevers, chills,  +fatigue, or  lassitude.  HEENT:   No headaches,  Difficulty swallowing,  Tooth/dental problems, or  Sore throat,                No sneezing, itching, ear ache, nasal congestion, post nasal drip,   CV:  No chest pain,  Orthopnea, PND, swelling in lower extremities, anasarca, dizziness, palpitations, syncope.   GI  No heartburn, indigestion, abdominal pain, nausea, vomiting, diarrhea, change in bowel habits, loss of appetite, bloody stools.   Resp:  No wheezing.  No chest wall deformity  Skin: no rash or lesions.  GU: no dysuria, change in color of urine, no urgency or frequency.  No flank pain, no hematuria   MS:  No joint pain or swelling.  No decreased range of motion.  No back pain.    Physical Exam  BP 136/80 (BP Location: Left Arm, Patient Position: Sitting, Cuff Size: Normal)   Pulse 60   Temp 98 F (36.7 C) (Oral)   Ht 5\' 9"  (1.753 m)   Wt 193 lb (87.5 kg)   SpO2 98%   BMI 28.50 kg/m   GEN: A/Ox3; pleasant , NAD, well nourished    HEENT:  [place]/AT,  NOSE-clear, THROAT-clear, no lesions, no postnasal drip or exudate  noted.   NECK:  Supple w/ fair ROM; no JVD; normal carotid impulses w/o bruits; no thyromegaly or nodules palpated; no lymphadenopathy.    RESP  Clear  P & A; w/o, wheezes/ rales/ or rhonchi. no accessory muscle use, no dullness to percussion  CARD:  RRR, no m/r/g, no peripheral edema, pulses intact, no cyanosis or clubbing.  GI:   Soft & nt; nml bowel sounds; no organomegaly or masses detected.   Musco: Warm bil, right hand deformity   Neuro: alert, no focal deficits noted.    Skin: Warm, no lesions or rashes    Lab Results:  CBC    Component Value Date/Time   WBC 8.3 09/27/2021 1526   RBC 5.45 09/27/2021 1526   HGB 17.2 (H) 09/27/2021 1526   HCT 45.5 09/27/2021 1526   PLT 310 09/27/2021 1526   MCV 83.5 09/27/2021 1526   MCH 31.6 09/27/2021 1526   MCHC 37.8 (H) 09/27/2021 1526   RDW 12.8 09/27/2021 1526   LYMPHSABS 2.8 09/23/2021 1624   MONOABS 0.5 09/23/2021 1624   EOSABS 0.3 09/23/2021 1624   BASOSABS 0.1 09/23/2021 1624   Fluids BMET    Component Value Date/Time   NA 138 09/27/2021 1930   K 3.5 09/27/2021 1930   CL 108 09/27/2021 1930   CO2 23 09/27/2021 1930   GLUCOSE 103 (H) 09/27/2021 1930   BUN 15 09/27/2021 1930   CREATININE 1.03 09/27/2021 1930   CALCIUM 8.2 (L) 09/27/2021 1930   GFRNONAA >60 09/27/2021 1930   GFRAA >60 08/20/2019 1825    BNP No results found for: "BNP"  ProBNP    Component Value Date/Time   PROBNP <5.0 09/27/2013 2008    Imaging: No results found.       Latest Ref Rng & Units 12/06/2021    3:47 PM  PFT Results  FVC-Pre L 1.88  P  FVC-Predicted Pre % 41  P  FVC-Post L 2.37  P  FVC-Predicted Post % 52  P  Pre FEV1/FVC % % 69  P  Post FEV1/FCV % % 83  P  FEV1-Pre L 1.31  P  FEV1-Predicted Pre % 37  P  FEV1-Post L 1.97  P    P Preliminary result    No results found for: "NITRICOXIDE"      Assessment & Plan:   Asthma-COPD overlap syndrome COPD with asthma.  Patient has significant reversibility on PFTs  consistent with asthma component.  He has a strong smoking history.  Recommend beginning maintenance inhaler.  We will begin with Breztri inhaler twice daily.  Patient education was given.  Sample pack was given with patient education. Patient has been referred to the lung cancer screening program. We will check alpha-1 test today.  Referred to pulmonary rehab. We will have patient follow back up in 4 weeks.  We will see how symptom burden is doing.  Patient is out of work until follow-up visit.  Plan  Patient Instructions  Begin BREZTRI 2 puffs Twice daily, rinse after use.  Albuterol inhaler As needed   Activity as tolerated.  Pulmonary rehab referral.  Labs today.  Out of work for 4 weeks .  Follow up with Dr. Thora Lance in 4 weeks and As needed   Please contact office for sooner follow up if symptoms do not improve or worsen or seek emergency care           Rubye Oaks, NP 12/07/2021

## 2021-12-07 NOTE — Patient Instructions (Addendum)
Begin BREZTRI 2 puffs Twice daily, rinse after use.  Albuterol inhaler As needed   Activity as tolerated.  Pulmonary rehab referral.  Labs today.  Out of work for 4 weeks .  Follow up with Dr. Thora Lance in 4 weeks and As needed   Please contact office for sooner follow up if symptoms do not improve or worsen or seek emergency care

## 2021-12-08 ENCOUNTER — Telehealth (HOSPITAL_COMMUNITY): Payer: Self-pay

## 2021-12-08 NOTE — Telephone Encounter (Signed)
Called and spoke with pt in regards to [place], patient expressed interest. Explained scheduling process, patient verbalized understanding.

## 2021-12-08 NOTE — Telephone Encounter (Addendum)
Form with additional medical questions was completed and faxed to [facility] ins, fax# [phone], included copy of today's office notes that document patient's PFT results.  It also states patient needs to be out of work for 4 weeks until follow-up with Dr. Thora Lance.   Also called patient and let him know the form has been faxed.  Mailed him a hard copy.

## 2021-12-10 NOTE — Telephone Encounter (Signed)
See other encounter from 11/17.

## 2021-12-13 ENCOUNTER — Other Ambulatory Visit: Payer: Self-pay | Admitting: *Deleted

## 2021-12-13 DIAGNOSIS — J449 Chronic obstructive pulmonary disease, unspecified: Secondary | ICD-10-CM

## 2021-12-18 LAB — ALPHA-1 ANTITRYPSIN PHENOTYPE: A-1 Antitrypsin, Ser: 113 mg/dL (ref 83–199)

## 2021-12-20 ENCOUNTER — Ambulatory Visit (HOSPITAL_COMMUNITY): Payer: BC Managed Care – PPO

## 2021-12-20 ENCOUNTER — Telehealth (HOSPITAL_COMMUNITY): Payer: Self-pay

## 2021-12-20 NOTE — Telephone Encounter (Signed)
Called Terry Mckinney about his appt in [place] today. Pt states he is unable to make his appt because he has to take care of his grandchildren and has car issues. Told Eleno that his paperwork will be given back to the schedulers to be rescheduled. Pt voiced understanding.

## 2021-12-27 NOTE — Progress Notes (Unsigned)
Synopsis: Referred for abnormal CT Chest, dyspnea by No ref. provider found  Subjective:   PATIENT ID: Terry Mckinney GENDER: male DOB: 07-04-1960, MRN: [id number]  No chief complaint on file.  61yM with history of asthma, smoking referred for dyspnea and abnormal CT with incidentally discovered 36mm LUL nodule  Never did have covid infection.   Dyspnea on and off for years but worse over last couple of months. He recognizes that these episodes can degenerate into panic attacks where he feels like his chest is caving in. He doesn't really have much of a cough now. He quit smoking 2 months ago. No other associated CP but does get some intermittent lower abdominal pain. When these episodes come on he has throat and chest tightness.   Did have asthma growing up but he was super young - doesn't remember how this was treated in the past. Has tried albuterol for this but he doesn't think it's helpful.   He has had a recent steroid pack and thinks it may have been helpful but only mild difference.    Father had respiratory issues but he's unsure what he had  Smoked 1ppd for 25y essentially. He is a Education administrator, works with [employer], wears mask/ppe at work.   Interval HPI Started on breztri and pulm rehab referral placed after last visit with parrett   Otherwise pertinent review of systems is negative.  Past Medical History:  Diagnosis Date   Asthma    Phobia      Family History  Problem Relation Age of Onset   Diabetes Mother    Asthma Father    Diabetes Other    Hypertension Other      Past Surgical History:  Procedure Laterality Date   ABDOMINAL SURGERY     I & D EXTREMITY Right 11/07/2013   Procedure: IRRIGATION AND DEBRIDEMENT EXTREMITY;  Surgeon: Betha Loa, MD;  Location: [facility] OR;  Service: Orthopedics;  Laterality: Right;   KNEE SURGERY      Social History   Socioeconomic History   Marital status: Single    Spouse name: Not on file   Number of children: Not  on file   Years of education: Not on file   Highest education level: Not on file  Occupational History   Not on file  Tobacco Use   Smoking status: Former    Packs/day: 1.00    Years: 25.00    Total pack years: 25.00    Types: Cigars, Cigarettes    Quit date: 08/31/2021    Years since quitting: 0.3   Smokeless tobacco: Never  Vaping Use   Vaping Use: Never used  Substance and Sexual Activity   Alcohol use: Yes    Comment: socially   Drug use: No   Sexual activity: Not on file  Other Topics Concern   Not on file  Social History Narrative   Not on file   Social Determinants of Health   Financial Resource Strain: Not on file  Food Insecurity: Not on file  Transportation Needs: Not on file  Physical Activity: Not on file  Stress: Not on file  Social Connections: Not on file  Intimate Partner Violence: Not on file     Allergies  Allergen Reactions   Naproxen Anaphylaxis, Shortness Of Breath and Swelling    Tongue and throat swelling      Outpatient Medications Prior to Visit  Medication Sig Dispense Refill   albuterol (VENTOLIN HFA) 108 (90 Base) MCG/ACT inhaler Inhale 2 puffs into  the lungs every 6 (six) hours as needed for wheezing or shortness of breath.     Budeson-Glycopyrrol-Formoterol (BREZTRI AEROSPHERE) 160-9-4.8 MCG/ACT AERO Inhale 2 puffs into the lungs in the morning and at bedtime. 5.9 g 0   escitalopram (LEXAPRO) 20 MG tablet Take 20 mg by mouth daily.     ibuprofen (ADVIL) 600 MG tablet Take 1 tablet (600 mg total) by mouth every 6 (six) hours as needed. 30 tablet 0   LORazepam (ATIVAN) 1 MG tablet Take 1 tablet (1 mg total) by mouth 3 (three) times daily as needed for anxiety. (Patient not taking: Reported on 11/02/2021) 15 tablet 0   No facility-administered medications prior to visit.       Objective:   Physical Exam:  General appearance: 61 y.o., male, NAD, conversant  Eyes: anicteric sclerae; PERRL, tracking appropriately HENT: NCAT;  MMM Neck: Trachea midline; no lymphadenopathy, no JVD Lungs: CTAB, no crackles, no wheeze, with normal respiratory effort CV: RRR, no murmur  Abdomen: Soft, non-tender; non-distended, BS present  Extremities: No peripheral edema, warm Skin: Normal turgor and texture; no rash Psych: Appropriate affect Neuro: Alert and oriented to person and place, no focal deficit     There were no vitals filed for this visit.    on RA BMI Readings from Last 3 Encounters:  12/07/21 28.50 kg/m  11/02/21 27.23 kg/m  09/27/21 28.32 kg/m   Wt Readings from Last 3 Encounters:  12/07/21 193 lb (87.5 kg)  11/02/21 184 lb 6.4 oz (83.6 kg)  09/27/21 191 lb 12.8 oz (87 kg)     CBC    Component Value Date/Time   WBC 8.3 09/27/2021 1526   RBC 5.45 09/27/2021 1526   HGB 17.2 (H) 09/27/2021 1526   HCT 45.5 09/27/2021 1526   PLT 310 09/27/2021 1526   MCV 83.5 09/27/2021 1526   MCH 31.6 09/27/2021 1526   MCHC 37.8 (H) 09/27/2021 1526   RDW 12.8 09/27/2021 1526   LYMPHSABS 2.8 09/23/2021 1624   MONOABS 0.5 09/23/2021 1624   EOSABS 0.3 09/23/2021 1624   BASOSABS 0.1 09/23/2021 1624    Chest Imaging: CTA CAP 9/7 reviewed by me unremarkable save for 51mm LUL nodule  Pulmonary Functions Testing Results:    Latest Ref Rng & Units 12/06/2021    3:47 PM  PFT Results  FVC-Pre L 1.88   FVC-Predicted Pre % 41   FVC-Post L 2.37   FVC-Predicted Post % 52   Pre FEV1/FVC % % 69   Post FEV1/FCV % % 83   FEV1-Pre L 1.31   FEV1-Predicted Pre % 37   FEV1-Post L 1.97    Borderline obstruction, Moderately severe, marked BD response      Assessment & Plan:   # ACOS # Episodic dyspnea: Panic vs asthma/COPD but nothing suggestive of this on exam today vs VCD (mentions associated throat tightness, good response to ativan).    # 34mm LUL nodule # History of smoking Congratulated him on cessation.   Plan: - We will schedule PFTs as soon as we can - Referral placed today for lung cancer screening,  due September 2024, you will be called for this - Letter written releasing from work till we can figure out why he's having such severe spells of dyspnea.  - see you in 8 weeks or sooner if need be!     Omar Person, MD [facility] 12/27/2021 6:56 PM

## 2021-12-28 ENCOUNTER — Ambulatory Visit (HOSPITAL_COMMUNITY): Payer: BC Managed Care – PPO

## 2021-12-29 ENCOUNTER — Encounter: Payer: Self-pay | Admitting: Student

## 2021-12-29 ENCOUNTER — Telehealth: Payer: Self-pay | Admitting: Student

## 2021-12-29 ENCOUNTER — Ambulatory Visit: Payer: BC Managed Care – PPO | Admitting: Student

## 2021-12-29 VITALS — BP 134/80 | HR 77 | Temp 98.1°F | Ht 69.0 in | Wt 189.4 lb

## 2021-12-29 DIAGNOSIS — J0100 Acute maxillary sinusitis, unspecified: Secondary | ICD-10-CM | POA: Diagnosis not present

## 2021-12-29 DIAGNOSIS — J4489 Other specified chronic obstructive pulmonary disease: Secondary | ICD-10-CM | POA: Diagnosis not present

## 2021-12-29 DIAGNOSIS — F17200 Nicotine dependence, unspecified, uncomplicated: Secondary | ICD-10-CM | POA: Diagnosis not present

## 2021-12-29 NOTE — Patient Instructions (Signed)
-   I will price out least expensive inhaler for you, keep using breztri 2 puff twice daily rinse mouth after use - take shower clear nose of crusting and then flonase 1 spray each nostril every day for at least a few weeks. If sinus congestion and ear fullness no better after this first week call us and we'll send in a prescription for antibiotic - see you in 3 months or sooner if need be!

## 2021-12-29 NOTE — Telephone Encounter (Signed)
What is least expensive triple therapy for him? If unaffordable then what is least expensive laba/ics?

## 2021-12-30 ENCOUNTER — Other Ambulatory Visit (HOSPITAL_COMMUNITY): Payer: Self-pay

## 2021-12-30 ENCOUNTER — Ambulatory Visit (HOSPITAL_COMMUNITY): Payer: BC Managed Care – PPO

## 2021-12-30 NOTE — Telephone Encounter (Signed)
Both triple therapies are showing as quite expensive per current benefits investigations. Cheaper alternatives are the ICS/LABA Advair Diskus and Symbicort.

## 2021-12-31 ENCOUNTER — Telehealth (HOSPITAL_COMMUNITY): Payer: Self-pay

## 2021-12-31 MED ORDER — BUDESONIDE-FORMOTEROL FUMARATE 160-4.5 MCG/ACT IN AERO: 2.0000 | INHALATION_SPRAY | Freq: Two times a day (BID) | RESPIRATORY_TRACT | 6 refills | Status: AC

## 2021-12-31 NOTE — Telephone Encounter (Signed)
Pt is unable to do the pulmonary rehab program per note by EP. Closed referral.

## 2022-01-04 ENCOUNTER — Ambulatory Visit (HOSPITAL_COMMUNITY): Payer: BC Managed Care – PPO

## 2022-01-06 ENCOUNTER — Ambulatory Visit (HOSPITAL_COMMUNITY): Payer: BC Managed Care – PPO

## 2022-01-11 ENCOUNTER — Ambulatory Visit (HOSPITAL_COMMUNITY): Payer: BC Managed Care – PPO

## 2022-01-13 ENCOUNTER — Ambulatory Visit (HOSPITAL_COMMUNITY): Payer: BC Managed Care – PPO

## 2022-01-18 ENCOUNTER — Ambulatory Visit (HOSPITAL_COMMUNITY): Payer: BC Managed Care – PPO

## 2022-01-20 ENCOUNTER — Ambulatory Visit (HOSPITAL_COMMUNITY): Payer: BC Managed Care – PPO

## 2022-01-23 ENCOUNTER — Other Ambulatory Visit: Payer: Self-pay

## 2022-01-23 ENCOUNTER — Emergency Department (HOSPITAL_BASED_OUTPATIENT_CLINIC_OR_DEPARTMENT_OTHER)
Admission: EM | Admit: 2022-01-23 | Discharge: 2022-01-23 | Disposition: A | Discharge status: Home / Self Care | Payer: BC Managed Care – PPO | Attending: Emergency Medicine | Admitting: Emergency Medicine

## 2022-01-23 ENCOUNTER — Encounter (HOSPITAL_BASED_OUTPATIENT_CLINIC_OR_DEPARTMENT_OTHER): Payer: Self-pay | Admitting: Emergency Medicine

## 2022-01-23 ENCOUNTER — Emergency Department (HOSPITAL_BASED_OUTPATIENT_CLINIC_OR_DEPARTMENT_OTHER): Payer: BC Managed Care – PPO

## 2022-01-23 DIAGNOSIS — Z20822 Contact with and (suspected) exposure to covid-19: Secondary | ICD-10-CM | POA: Insufficient documentation

## 2022-01-23 DIAGNOSIS — R55 Syncope and collapse: Secondary | ICD-10-CM | POA: Diagnosis present

## 2022-01-23 DIAGNOSIS — J449 Chronic obstructive pulmonary disease, unspecified: Secondary | ICD-10-CM | POA: Insufficient documentation

## 2022-01-23 DIAGNOSIS — F410 Panic disorder [episodic paroxysmal anxiety] without agoraphobia: Secondary | ICD-10-CM

## 2022-01-23 DIAGNOSIS — F419 Anxiety disorder, unspecified: Secondary | ICD-10-CM | POA: Insufficient documentation

## 2022-01-23 DIAGNOSIS — E860 Dehydration: Secondary | ICD-10-CM | POA: Diagnosis not present

## 2022-01-23 HISTORY — DX: Chronic obstructive pulmonary disease, unspecified: J44.9

## 2022-01-23 LAB — BASIC METABOLIC PANEL
Anion gap: 11 (ref 5–15)
BUN: 12 mg/dL (ref 6–20)
CO2: 20 mmol/L — ABNORMAL LOW (ref 22–32)
Calcium: 8.5 mg/dL — ABNORMAL LOW (ref 8.9–10.3)
Chloride: 100 mmol/L (ref 98–111)
Creatinine, Ser: 0.98 mg/dL (ref 0.61–1.24)
GFR, Estimated: >60 mL/min (ref >60)
Glucose, Bld: 217 mg/dL — ABNORMAL HIGH (ref 70–99)
Potassium: 3.4 mmol/L — ABNORMAL LOW (ref 3.5–5.1)
Sodium: 131 mmol/L — ABNORMAL LOW (ref 135–145)

## 2022-01-23 LAB — LACTIC ACID, PLASMA
Lactic Acid, Venous: 2.6 mmol/L (ref 0.5–1.9)
Lactic Acid, Venous: 2.2 mmol/L (ref 0.5–1.9)

## 2022-01-23 LAB — CBC
HCT: 39.8 % (ref 39.0–52.0)
Hemoglobin: 14.6 g/dL (ref 13.0–17.0)
MCH: 30.5 pg (ref 26.0–34.0)
MCHC: 36.7 g/dL — ABNORMAL HIGH (ref 30.0–36.0)
MCV: 83.3 fL (ref 80.0–100.0)
Platelets: 286 10*3/uL (ref 150–400)
RBC: 4.78 MIL/uL (ref 4.22–5.81)
RDW: 12.4 % (ref 11.5–15.5)
WBC: 6.3 10*3/uL (ref 4.0–10.5)
nRBC: 0 % (ref 0.0–0.2)

## 2022-01-23 LAB — TROPONIN I (HIGH SENSITIVITY): Troponin I (High Sensitivity): 2 ng/L (ref <18)

## 2022-01-23 LAB — RESP PANEL BY RT-PCR (RSV, FLU A&B, COVID)  RVPGX2
Influenza A by PCR: NEGATIVE
Influenza B by PCR: NEGATIVE
Resp Syncytial Virus by PCR: NEGATIVE
SARS Coronavirus 2 by RT PCR: NEGATIVE

## 2022-01-23 LAB — URINALYSIS, ROUTINE W REFLEX MICROSCOPIC
Bilirubin Urine: NEGATIVE
Glucose, UA: 100 mg/dL — AB
Hgb urine dipstick: NEGATIVE
Ketones, ur: NEGATIVE mg/dL
Leukocytes,Ua: NEGATIVE
Nitrite: NEGATIVE
Protein, ur: NEGATIVE mg/dL
Specific Gravity, Urine: 1.02 (ref 1.005–1.030)
pH: 6.5 (ref 5.0–8.0)

## 2022-01-23 MED ORDER — LORAZEPAM 1 MG PO TABS: 1.0000 mg | ORAL_TABLET | Freq: Two times a day (BID) | ORAL | 0 refills | Status: AC | PRN

## 2022-01-23 MED ORDER — LORAZEPAM 1 MG PO TABS
1.0000 mg | ORAL_TABLET | Freq: Once | ORAL | Status: AC
  Administered 2022-01-23: 1 mg via ORAL
  Filled 2022-01-23: qty 1

## 2022-01-23 MED ORDER — SODIUM CHLORIDE 0.9 % IV BOLUS
1000.0000 mL | Freq: Once | INTRAVENOUS | Status: AC
  Administered 2022-01-23: 1000 mL via INTRAVENOUS

## 2022-01-23 NOTE — ED Notes (Signed)
IV removed.

## 2022-01-23 NOTE — ED Provider Notes (Signed)
[facility] Provider Note   CSN: [id number] Arrival date & time: 01/23/22  1343     History {Add pertinent medical, surgical, social history, OB history to HPI:1} Chief Complaint  Patient presents with   Fatigue   Loss of Consciousness    Terry Mckinney is a 62 y.o. male with reported history of COPD and emphysema, anxiety, presenting to the ED with concern for near syncope.  The patient reports that he suffers from severe panic attacks, including claustrophobia panic attacks, and he was doing a lot of stress over the past week.  He said when he was very upset he began to feel lightheaded and thinks he had a brief loss of consciousness.  When he woke up he was on the floor and was having pain on his left rib.  He does endorse a prior history of anxiety and near syncope during moments of pain and anxiety.  HPI     Home Medications Prior to Admission medications   Medication Sig Start Date End Date Taking? Authorizing Provider  albuterol (VENTOLIN HFA) 108 (90 Base) MCG/ACT inhaler Inhale 2 puffs into the lungs every 6 (six) hours as needed for wheezing or shortness of breath.    [provider]  budesonide-formoterol (SYMBICORT) 160-4.5 MCG/ACT inhaler Inhale 2 puffs into the lungs 2 (two) times daily. 12/31/21   Maryjane Hurter, MD  escitalopram (LEXAPRO) 20 MG tablet Take 20 mg by mouth daily.    [provider]  ibuprofen (ADVIL) 600 MG tablet Take 1 tablet (600 mg total) by mouth every 6 (six) hours as needed. 03/07/20   Domenic Moras, PA-C  LORazepam (ATIVAN) 1 MG tablet Take 1 tablet (1 mg total) by mouth 3 (three) times daily as needed for anxiety. Patient not taking: Reported on 11/02/2021 09/27/21   Tegeler, Gwenyth Allegra, MD      Allergies    Naproxen    Review of Systems   Review of Systems  Physical Exam Updated Vital Signs BP 123/78   Pulse (!) 58   Temp 98.1 F (36.7 C) (Oral)   Resp 17   Wt 87.1 kg   SpO2 100%    BMI 28.35 kg/m  Physical Exam Constitutional:      General: He is not in acute distress. HENT:     Head: Normocephalic and atraumatic.  Eyes:     Conjunctiva/sclera: Conjunctivae normal.     Pupils: Pupils are equal, round, and reactive to light.  Cardiovascular:     Rate and Rhythm: Normal rate and regular rhythm.  Pulmonary:     Effort: Pulmonary effort is normal. No respiratory distress.  Abdominal:     General: There is no distension.     Tenderness: There is no abdominal tenderness.  Musculoskeletal:        General: No tenderness.  Skin:    General: Skin is warm and dry.  Neurological:     General: No focal deficit present.     Mental Status: He is alert. Mental status is at baseline.  Psychiatric:        Mood and Affect: Mood normal.        Behavior: Behavior normal.     ED Results / Procedures / Treatments   Labs (all labs ordered are listed, but only abnormal results are displayed) Labs Reviewed  BASIC METABOLIC PANEL - Abnormal; Notable for the following components:      Result Value   Sodium 131 (*)    Potassium 3.4 (*)  CO2 20 (*)    Glucose, Bld 217 (*)    Calcium 8.5 (*)    All other components within normal limits  CBC - Abnormal; Notable for the following components:   MCHC 36.7 (*)    All other components within normal limits  LACTIC ACID, PLASMA - Abnormal; Notable for the following components:   Lactic Acid, Venous 2.6 (*)    All other components within normal limits  RESP PANEL BY RT-PCR (RSV, FLU A&B, COVID)  RVPGX2  URINALYSIS, ROUTINE W REFLEX MICROSCOPIC  LACTIC ACID, PLASMA  CBG MONITORING, ED  TROPONIN I (HIGH SENSITIVITY)    EKG EKG Interpretation  Date/Time:  Sunday January 23 2022 14:16:49 EST Ventricular Rate:  71 PR Interval:  190 QRS Duration: 80 QT Interval:  382 QTC Calculation: 416 R Axis:   54 Text Interpretation: Sinus rhythm Probable left atrial enlargement RSR' in V1 or V2, probably normal variant Confirmed by  Alvester Chou [phone]) on 01/23/2022 3:03:01 PM  Radiology DG Ribs Unilateral W/Chest Left  Result Date: 01/23/2022 CLINICAL DATA:  Trauma, fall, syncope EXAM: LEFT RIBS AND CHEST - 3+ VIEW COMPARISON:  09/27/2021 FINDINGS: Cardiac size is within normal limits. There are no signs of pulmonary edema or focal pulmonary consolidation. There is no pleural effusion or pneumothorax. No displaced fractures are seen in left ribs. IMPRESSION: No fracture is seen in left ribs. No active cardiopulmonary disease. Electronically Signed   By: Ernie Avena M.D.   On: 01/23/2022 15:30   CT Head Wo Contrast  Result Date: 01/23/2022 CLINICAL DATA:  Vertigo EXAM: CT HEAD WITHOUT CONTRAST TECHNIQUE: Contiguous axial images were obtained from the base of the skull through the vertex without intravenous contrast. RADIATION DOSE REDUCTION: This exam was performed according to the departmental dose-optimization program which includes automated exposure control, adjustment of the mA and/or kV according to patient size and/or use of iterative reconstruction technique. COMPARISON:  Head CT 05/06/2014.  MRI brain 03/15/2020. FINDINGS: Brain: No evidence of acute infarction, hemorrhage, hydrocephalus, extra-axial collection or mass lesion/mass effect. Vascular: Atherosclerotic calcifications are present within the cavernous internal carotid arteries. Skull: Normal. Negative for fracture or focal lesion. Sinuses/Orbits: No acute finding. Other: None. IMPRESSION: No acute intracranial abnormality. Electronically Signed   By: Darliss Cheney M.D.   On: 01/23/2022 15:30    Procedures Procedures  {Document cardiac monitor, telemetry assessment procedure when appropriate:1}  Medications Ordered in ED Medications  sodium chloride 0.9 % bolus 1,000 mL (has no administration in time range)  LORazepam (ATIVAN) tablet 1 mg (has no administration in time range)    ED Course/ Medical Decision Making/ A&P                            Medical Decision Making Amount and/or Complexity of Data Reviewed Labs: ordered.  Risk Prescription drug management.   This patient presents to the ED with concern for near syncope. This involves an extensive number of treatment options, and is a complaint that carries with it a high risk of complications and morbidity.  The differential diagnosis includes vasovagal episode versus anxiety or panic attack versus arrhythmia versus anemia versus dehydration versus other  Co-morbidities that complicate the patient evaluation: History of severe anxiety, at high risk of hyperventilation and vasovagal episode  Additional history obtained from patient's friend at the bedside, with the patient's permission  External records from outside source obtained and reviewed including office evaluations for anxiety.  PCP did initiate  the patient several months ago on sertraline and escitalopram, which the patient has only been taking intermittently.  I did educate the patient on the need to stay on these medications regularly for several weeks to build up therapeutic levels in the bloodstream, likely 6 to 8 weeks at a minimum.  In the short-term I think it is reasonable in this particular case to provide a small dose of Ativan which helped him a lot in the past.  I also explained that I would give him 4 5 Ativan tablets to keep for emergency purposes at home, but we discussed why long-term benzos are not a good choice for managing chronic anxiety, including the concerns for benzo withdrawal and dependency.  I ordered and personally interpreted labs.  The pertinent results include: No significant findings or abnormalities.  Lactate was ordered from triage which is elevated 2.6.  I suspect this is related to poor hydration as the patient reports he has not been drinking as much water as he should, and is generally drinking soda and juice.  Imaging studies including CT of the head and x-ray of the chest ordered  from triage I independently visualized and interpreted imaging which showed no emergent findings I agree with the radiologist interpretation  The patient was maintained on a cardiac monitor.  I personally viewed and interpreted the cardiac monitored which showed an underlying rhythm of: Sinus rhythm  Per my interpretation the patient's ECG shows sinus rhythm without acute ischemic findings  I ordered medication including IV fluid bolus for hydration.  Oral Ativan for anxiety  I have reviewed the patients home medicines and have made adjustments as needed  Test Considered: Very low suspicion for acute PE, meningitis.  I do not feel that LP or CT pulmonary angiogram is necessary.  After the interventions noted above, I reevaluated the patient and found that they have: improved  Dispostion:  After consideration of the diagnostic results and the patients response to treatment, I feel that the patent would benefit from outpatient PCP follow-up.    {Document critical care time when appropriate:1} {Document review of labs and clinical decision tools ie heart score, Chads2Vasc2 etc:1}  {Document your independent review of radiology images, and any outside records:1} {Document your discussion with family members, caretakers, and with consultants:1} {Document social determinants of health affecting pt's care:1} {Document your decision making why or why not admission, treatments were needed:1} Final Clinical Impression(s) / ED Diagnoses Final diagnoses:  None    Rx / DC Orders ED Discharge Orders     None

## 2022-01-23 NOTE — ED Notes (Signed)
Patient transported to CT 

## 2022-01-23 NOTE — ED Triage Notes (Signed)
Pt arrives pov, slow gait, reports multiple falls x 3 days. Pt reports syncope 3 days pta, c/o feeling weak today. Pt c/o LT rib pain. Speech clear, bilaterally equal. Pt denies thinners.

## 2022-01-25 ENCOUNTER — Ambulatory Visit (HOSPITAL_COMMUNITY): Payer: BC Managed Care – PPO

## 2022-01-27 ENCOUNTER — Ambulatory Visit (HOSPITAL_COMMUNITY): Payer: BC Managed Care – PPO

## 2022-02-01 ENCOUNTER — Other Ambulatory Visit: Payer: Self-pay | Admitting: Cardiology

## 2022-02-01 ENCOUNTER — Emergency Department (INDEPENDENT_AMBULATORY_CARE_PROVIDER_SITE_OTHER): Payer: BC Managed Care – PPO

## 2022-02-01 ENCOUNTER — Encounter (HOSPITAL_BASED_OUTPATIENT_CLINIC_OR_DEPARTMENT_OTHER): Payer: Self-pay

## 2022-02-01 ENCOUNTER — Other Ambulatory Visit: Payer: Self-pay

## 2022-02-01 ENCOUNTER — Ambulatory Visit (HOSPITAL_COMMUNITY): Payer: BC Managed Care – PPO

## 2022-02-01 ENCOUNTER — Emergency Department (HOSPITAL_BASED_OUTPATIENT_CLINIC_OR_DEPARTMENT_OTHER)
Admission: EM | Admit: 2022-02-01 | Discharge: 2022-02-01 | Disposition: A | Discharge status: Home / Self Care | Payer: BC Managed Care – PPO | Attending: Emergency Medicine | Admitting: Emergency Medicine

## 2022-02-01 ENCOUNTER — Emergency Department (HOSPITAL_BASED_OUTPATIENT_CLINIC_OR_DEPARTMENT_OTHER): Payer: BC Managed Care – PPO

## 2022-02-01 DIAGNOSIS — J449 Chronic obstructive pulmonary disease, unspecified: Secondary | ICD-10-CM | POA: Insufficient documentation

## 2022-02-01 DIAGNOSIS — R0602 Shortness of breath: Secondary | ICD-10-CM | POA: Diagnosis not present

## 2022-02-01 DIAGNOSIS — R55 Syncope and collapse: Secondary | ICD-10-CM

## 2022-02-01 HISTORY — DX: Anxiety disorder, unspecified: F41.9

## 2022-02-01 LAB — CBC
HCT: 41 % (ref 39.0–52.0)
Hemoglobin: 15.1 g/dL (ref 13.0–17.0)
MCH: 30.4 pg (ref 26.0–34.0)
MCHC: 36.8 g/dL — ABNORMAL HIGH (ref 30.0–36.0)
MCV: 82.5 fL (ref 80.0–100.0)
Platelets: 293 10*3/uL (ref 150–400)
RBC: 4.97 MIL/uL (ref 4.22–5.81)
RDW: 12.5 % (ref 11.5–15.5)
WBC: 7 10*3/uL (ref 4.0–10.5)
nRBC: 0 % (ref 0.0–0.2)

## 2022-02-01 LAB — URINALYSIS, ROUTINE W REFLEX MICROSCOPIC
Bilirubin Urine: NEGATIVE
Glucose, UA: >=500 mg/dL — AB
Hgb urine dipstick: NEGATIVE
Ketones, ur: NEGATIVE mg/dL
Leukocytes,Ua: NEGATIVE
Nitrite: NEGATIVE
Protein, ur: NEGATIVE mg/dL
Specific Gravity, Urine: 1.025 (ref 1.005–1.030)
pH: 5.5 (ref 5.0–8.0)

## 2022-02-01 LAB — BASIC METABOLIC PANEL
Anion gap: 9 (ref 5–15)
BUN: 19 mg/dL (ref 6–20)
CO2: 23 mmol/L (ref 22–32)
Calcium: 8.9 mg/dL (ref 8.9–10.3)
Chloride: 101 mmol/L (ref 98–111)
Creatinine, Ser: 1.04 mg/dL (ref 0.61–1.24)
GFR, Estimated: >60 mL/min (ref >60)
Glucose, Bld: 199 mg/dL — ABNORMAL HIGH (ref 70–99)
Potassium: 3.9 mmol/L (ref 3.5–5.1)
Sodium: 133 mmol/L — ABNORMAL LOW (ref 135–145)

## 2022-02-01 LAB — D-DIMER, QUANTITATIVE: D-Dimer, Quant: 0.28 ug/mL-FEU (ref 0.00–0.50)

## 2022-02-01 LAB — URINALYSIS, MICROSCOPIC (REFLEX)
Bacteria, UA: NONE SEEN
RBC / HPF: NONE SEEN RBC/hpf (ref 0–5)

## 2022-02-01 LAB — TROPONIN I (HIGH SENSITIVITY): Troponin I (High Sensitivity): 3 ng/L (ref <18)

## 2022-02-01 LAB — BRAIN NATRIURETIC PEPTIDE: B Natriuretic Peptide: 8.1 pg/mL (ref 0.0–100.0)

## 2022-02-01 LAB — CBG MONITORING, ED: Glucose-Capillary: 138 mg/dL — ABNORMAL HIGH (ref 70–99)

## 2022-02-01 MED ORDER — LORAZEPAM 1 MG PO TABS
1.0000 mg | ORAL_TABLET | Freq: Once | ORAL | Status: AC
  Administered 2022-02-01: 1 mg via ORAL
  Filled 2022-02-01: qty 1

## 2022-02-01 NOTE — Progress Notes (Signed)
14 day monitor-- ordered for evaluation of syncope. To be read by Dr. Harl Bowie. I have messaged our office to arrange, monitor will be mailed to patient's home.   Patient has a new patient appointment with Dr. Harl Bowie on 2/28.  Margie Billet, PA-C 02/01/2022 3:57 PM

## 2022-02-01 NOTE — Discharge Instructions (Addendum)
Evaluation of your passing out is overall reassuring.  Your labs look okay and your x-ray is normal.  EKG is also reassuring.  Nevertheless since you have passed out multiple times in 6 months I do feel it is warranted that you follow-up with cardiology.  Please schedule appointment at your convenience.  If you have another episode of passing out, new shortness of breath or chest pain please return the emergency department further evaluation.

## 2022-02-01 NOTE — ED Provider Notes (Signed)
[facility]ENC[facility]PARTMENT Provider Note   CSN: [id number] Arrival date & time: 02/01/22  1016     History  Chief Complaint  Patient presents with   Loss of Consciousness   Shortness of Breath   HPI Terry Mckinney is a 62 y.o. male with with COPD and anxiety presenting for syncope and shortness of breath.  States since August he is passed out at least 6 times.  Most recently he was outside in the backyard when he passed out "without warning".  Denies prodromal symptoms.  Denies chest pain shortness of breath.  States he was evaluated for this complaint last Friday.  MRI of his brain at that time was normal.  Patient also endorses intermittent shortness of breath usually when he is having a "panic episode.  Patient states that he has severe panic and close spaces, walking through doors and elevators.  Patient primary concern for the recurrence of his syncope.  Mentioned that his mother and grandmother died of heart attacks.   Loss of Consciousness Associated symptoms: shortness of breath   Shortness of Breath Associated symptoms: syncope        Home Medications Prior to Admission medications   Medication Sig Start Date End Date Taking? Authorizing Provider  albuterol (VENTOLIN HFA) 108 (90 Base) MCG/ACT inhaler Inhale 2 puffs into the lungs every 6 (six) hours as needed for wheezing or shortness of breath.    [provider]  budesonide-formoterol (SYMBICORT) 160-4.5 MCG/ACT inhaler Inhale 2 puffs into the lungs 2 (two) times daily. 12/31/21   Omar Person, MD  escitalopram (LEXAPRO) 20 MG tablet Take 20 mg by mouth daily.    [provider]  ibuprofen (ADVIL) 600 MG tablet Take 1 tablet (600 mg total) by mouth every 6 (six) hours as needed. 03/07/20   Fayrene Helper, PA-C  LORazepam (ATIVAN) 1 MG tablet Take 1 tablet (1 mg total) by mouth 3 (three) times daily as needed for anxiety. Patient not taking: Reported on 11/02/2021 09/27/21   Tegeler,  Canary Brim, MD  LORazepam (ATIVAN) 1 MG tablet Take 1 tablet (1 mg total) by mouth 2 (two) times daily as needed for up to 5 doses for anxiety. 01/23/22   Terald Sleeper, MD      Allergies    Naproxen    Review of Systems   Review of Systems  Respiratory:  Positive for shortness of breath.   Cardiovascular:  Positive for syncope.    Physical Exam   Vitals:   02/01/22 1417 02/01/22 1515  BP: 118/89 115/77  Pulse: (!) 57 (!) 57  Resp: 15 14  Temp: 98.2 F (36.8 C)   SpO2: 99% 94%    CONSTITUTIONAL:  well-appearing, NAD NEURO:  Alert and oriented x 3, CN 3-12 grossly intact EYES:  eyes equal and reactive ENT/NECK:  Supple, no stridor  CARDIO:  regular rate and rhythm, appears well-perfused  PULM:  No respiratory distress, CTAB MSK/SPINE:  No gross deformities, no edema, moves all extremities  SKIN:  no rash, atraumatic   *Additional and/or pertinent findings included in MDM below    ED Results / Procedures / Treatments   Labs (all labs ordered are listed, but only abnormal results are displayed) Labs Reviewed  CBC - Abnormal; Notable for the following components:      Result Value   MCHC 36.8 (*)    All other components within normal limits  URINALYSIS, ROUTINE W REFLEX MICROSCOPIC - Abnormal; Notable for the following components:  Glucose, UA >=500 (*)    All other components within normal limits  BASIC METABOLIC PANEL - Abnormal; Notable for the following components:   Sodium 133 (*)    Glucose, Bld 199 (*)    All other components within normal limits  CBG MONITORING, ED - Abnormal; Notable for the following components:   Glucose-Capillary 138 (*)    All other components within normal limits  BRAIN NATRIURETIC PEPTIDE  D-DIMER, QUANTITATIVE  URINALYSIS, MICROSCOPIC (REFLEX)  TROPONIN I (HIGH SENSITIVITY)    EKG EKG Interpretation  Date/Time:  Tuesday February 01 2022 10:33:35 EST Ventricular Rate:  67 PR Interval:  193 QRS Duration: 85 QT  Interval:  390 QTC Calculation: 412 R Axis:   59 Text Interpretation: Sinus rhythm Minimal ST elevation, anterior leads Confirmed by Regan Lemming ([id number]) on 02/01/2022 10:35:15 AM  Radiology DG Chest 2 View  Result Date: 02/01/2022 CLINICAL DATA:  Syncopal episode.  Shortness of breath. EXAM: CHEST - 2 VIEW COMPARISON:  01/23/2022 FINDINGS: The cardiac silhouette, mediastinal and hilar contours are normal. The lungs are clear. No infiltrates or effusions. The bony thorax is intact. IMPRESSION: No acute cardiopulmonary findings. Electronically Signed   By: Marijo Sanes M.D.   On: 02/01/2022 13:57    Procedures Procedures    Medications Ordered in ED Medications  LORazepam (ATIVAN) tablet 1 mg (1 mg Oral Given 02/01/22 1336)    ED Course/ Medical Decision Making/ A&P                             Medical Decision Making Amount and/or Complexity of Data Reviewed Labs: ordered. Radiology: ordered.  Risk Prescription drug management.   Initial Impression and Ddx 62 year old male who is well-appearing and hemodynamically stable presenting for syncope and shortness of breath.  Physical exam was unremarkable with no focal neurodeficits and stable vitals.  Initial diagnosis for this complaint includes intracranial mass, cardiogenic syncope, aortic or carotid stenosis, electrolyte derangement, anemia, and dehydration. Patient PMH that increases complexity of ED encounter:  COPD and anxiety.  Interpretation of Diagnostics I independent reviewed and interpreted the labs as followed: Hyponatremia, hyperglycemia  - I independently visualized the following imaging with scope of interpretation limited to determining acute life threatening conditions related to emergency care: CXR, which revealed no acute cardiopulmonary processes  I personally  interpreted and reviewed EKG which revealed normal sinus rhythm.  Patient Reassessment and Ultimate Disposition/Management Given multiple recurrences  of syncope without apparent prodrome, was concern for cardiac involvement of some kind.  Reached out to Dr. Harl Bowie of cardiology who advised to see him outpatient for further follow-up.  Chart review revealed that he was evaluated for this complaint about a week ago.  MRI brain at that time was reassuring.  During this encounter, patient stated that he was started to feel very anxious.  Treated with 1 mg of Ativan.  Patient stated he felt much better.  Discharged with plans to follow-up with cardiology for recurrent syncope.  Patient management required discussion with the following services or consulting groups:  Cardiology  Complexity of Problems Addressed Acute complicated illness or Injury  Additional Data Reviewed and Analyzed Further history obtained from: Prior ED visit notes and Prior labs/imaging results  Patient Encounter Risk Assessment None         Final Clinical Impression(s) / ED Diagnoses Final diagnoses:  Syncope, unspecified syncope type    Rx / DC Orders ED Discharge Orders  None         Harriet Pho, PA-C 02/01/22 1548    Regan Lemming, MD 02/02/22 [phone]

## 2022-02-01 NOTE — ED Notes (Signed)
Only able to obtain two tubes of blood in triage. Pt very anxious and unable to sit still. 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

## 2022-02-01 NOTE — ED Notes (Signed)
Has been having episodes of dizziness with no pattern, can occur at any time of day or situation. Has hx of COPD and Asthma, Denies any chest pain at this time. Placed in cont cardiac monitor with cont POX

## 2022-02-01 NOTE — ED Notes (Signed)
In triage for SOB, BBS clear decreased, SpO2 100% on r/a, congested cough, able to speak in complete sentences, no accessory muscle use.

## 2022-02-01 NOTE — ED Triage Notes (Addendum)
Pt c/o SOB and multiple syncope episodes x6 months. Last LOC was yesterday.  Pt was seen recently for same.  Hx of COPD, asthma, and anxiety.  Pt noted to be very anxious during triage and spoke about many things that make him anxious.

## 2022-02-01 NOTE — ED Notes (Signed)
HIGH FALL RISK client due to having a syncopal episode at home, noted also to be unsteady in his gait in the exam room while c/o dizziness.

## 2022-02-01 NOTE — Progress Notes (Unsigned)
Enrolled patient for a 14 day Zio XT monitor to be mailed to patients home  Terry Mckinney to read

## 2022-02-03 ENCOUNTER — Ambulatory Visit (HOSPITAL_COMMUNITY): Payer: BC Managed Care – PPO

## 2022-02-03 DIAGNOSIS — R55 Syncope and collapse: Secondary | ICD-10-CM

## 2022-02-08 ENCOUNTER — Ambulatory Visit (HOSPITAL_COMMUNITY): Payer: BC Managed Care – PPO

## 2022-02-10 ENCOUNTER — Ambulatory Visit (HOSPITAL_COMMUNITY): Payer: BC Managed Care – PPO

## 2022-02-15 ENCOUNTER — Ambulatory Visit (HOSPITAL_COMMUNITY): Payer: BC Managed Care – PPO

## 2022-02-17 ENCOUNTER — Ambulatory Visit (HOSPITAL_COMMUNITY): Payer: BC Managed Care – PPO

## 2022-02-22 ENCOUNTER — Ambulatory Visit (HOSPITAL_COMMUNITY): Payer: BC Managed Care – PPO

## 2022-02-24 ENCOUNTER — Ambulatory Visit (HOSPITAL_COMMUNITY): Payer: BC Managed Care – PPO

## 2022-03-03 ENCOUNTER — Ambulatory Visit: Payer: BC Managed Care – PPO | Admitting: Cardiology

## 2022-03-16 ENCOUNTER — Ambulatory Visit: Payer: BC Managed Care – PPO | Admitting: Internal Medicine

## 2022-05-02 ENCOUNTER — Emergency Department (HOSPITAL_BASED_OUTPATIENT_CLINIC_OR_DEPARTMENT_OTHER)
Admission: EM | Admit: 2022-05-02 | Discharge: 2022-05-03 | Disposition: A | Discharge status: Home / Self Care | Payer: BC Managed Care – PPO | Attending: Emergency Medicine | Admitting: Emergency Medicine

## 2022-05-02 ENCOUNTER — Other Ambulatory Visit: Payer: Self-pay

## 2022-05-02 ENCOUNTER — Encounter (HOSPITAL_BASED_OUTPATIENT_CLINIC_OR_DEPARTMENT_OTHER): Payer: Self-pay | Admitting: Emergency Medicine

## 2022-05-02 ENCOUNTER — Emergency Department (HOSPITAL_BASED_OUTPATIENT_CLINIC_OR_DEPARTMENT_OTHER): Payer: BC Managed Care – PPO

## 2022-05-02 DIAGNOSIS — R0602 Shortness of breath: Secondary | ICD-10-CM | POA: Diagnosis not present

## 2022-05-02 DIAGNOSIS — J449 Chronic obstructive pulmonary disease, unspecified: Secondary | ICD-10-CM | POA: Diagnosis not present

## 2022-05-02 DIAGNOSIS — R0789 Other chest pain: Secondary | ICD-10-CM | POA: Diagnosis not present

## 2022-05-02 DIAGNOSIS — R079 Chest pain, unspecified: Secondary | ICD-10-CM | POA: Diagnosis present

## 2022-05-02 LAB — CBC WITH DIFFERENTIAL/PLATELET
Abs Immature Granulocytes: 0.01 10*3/uL (ref 0.00–0.07)
Basophils Absolute: 0 10*3/uL (ref 0.0–0.1)
Basophils Relative: 1 %
Eosinophils Absolute: 0.2 10*3/uL (ref 0.0–0.5)
Eosinophils Relative: 3 %
HCT: 42.8 % (ref 39.0–52.0)
Hemoglobin: 15.4 g/dL (ref 13.0–17.0)
Immature Granulocytes: 0 %
Lymphocytes Relative: 30 %
Lymphs Abs: 2 10*3/uL (ref 0.7–4.0)
MCH: 28.7 pg (ref 26.0–34.0)
MCHC: 36 g/dL (ref 30.0–36.0)
MCV: 79.9 fL — ABNORMAL LOW (ref 80.0–100.0)
Monocytes Absolute: 0.6 10*3/uL (ref 0.1–1.0)
Monocytes Relative: 9 %
Neutro Abs: 3.9 10*3/uL (ref 1.7–7.7)
Neutrophils Relative %: 57 %
Platelets: 325 10*3/uL (ref 150–400)
RBC: 5.36 MIL/uL (ref 4.22–5.81)
RDW: 13.1 % (ref 11.5–15.5)
WBC: 6.7 10*3/uL (ref 4.0–10.5)
nRBC: 0 % (ref 0.0–0.2)

## 2022-05-02 LAB — COMPREHENSIVE METABOLIC PANEL
ALT: 25 U/L (ref 0–44)
AST: 29 U/L (ref 15–41)
Albumin: 4 g/dL (ref 3.5–5.0)
Alkaline Phosphatase: 80 U/L (ref 38–126)
Anion gap: 8 (ref 5–15)
BUN: 14 mg/dL (ref 6–20)
CO2: 21 mmol/L — ABNORMAL LOW (ref 22–32)
Calcium: 8.6 mg/dL — ABNORMAL LOW (ref 8.9–10.3)
Chloride: 105 mmol/L (ref 98–111)
Creatinine, Ser: 1.07 mg/dL (ref 0.61–1.24)
GFR, Estimated: >60 mL/min (ref >60)
Glucose, Bld: 144 mg/dL — ABNORMAL HIGH (ref 70–99)
Potassium: 3.7 mmol/L (ref 3.5–5.1)
Sodium: 134 mmol/L — ABNORMAL LOW (ref 135–145)
Total Bilirubin: 0.7 mg/dL (ref 0.3–1.2)
Total Protein: 7.7 g/dL (ref 6.5–8.1)

## 2022-05-02 LAB — TROPONIN I (HIGH SENSITIVITY): Troponin I (High Sensitivity): 5 ng/L (ref <18)

## 2022-05-02 MED ORDER — LIDOCAINE 5 % EX PTCH
1.0000 | MEDICATED_PATCH | CUTANEOUS | Status: DC
  Administered 2022-05-02: 1 via TRANSDERMAL
  Filled 2022-05-02: qty 1

## 2022-05-02 MED ORDER — LORAZEPAM 1 MG PO TABS
1.0000 mg | ORAL_TABLET | Freq: Once | ORAL | Status: AC
  Administered 2022-05-02: 1 mg via ORAL
  Filled 2022-05-02: qty 1

## 2022-05-02 NOTE — ED Provider Notes (Signed)
  [place] [facility] Provider Note   CSN: [id number] Arrival date & time: 05/02/22  1510     History {Add pertinent medical, surgical, social history, OB history to HPI:1} Chief Complaint  Patient presents with   Shortness of Breath    Terry Mckinney is a 62 y.o. male.   Shortness of Breath      Home Medications Prior to Admission medications   Medication Sig Start Date End Date Taking? Authorizing Provider  albuterol (VENTOLIN HFA) 108 (90 Base) MCG/ACT inhaler Inhale 2 puffs into the lungs every 6 (six) hours as needed for wheezing or shortness of breath.    [provider]  budesonide-formoterol (SYMBICORT) 160-4.5 MCG/ACT inhaler Inhale 2 puffs into the lungs 2 (two) times daily. 12/31/21   Omar Person, MD  escitalopram (LEXAPRO) 20 MG tablet Take 20 mg by mouth daily.    [provider]  ibuprofen (ADVIL) 600 MG tablet Take 1 tablet (600 mg total) by mouth every 6 (six) hours as needed. 03/07/20   Fayrene Helper, PA-C  LORazepam (ATIVAN) 1 MG tablet Take 1 tablet (1 mg total) by mouth 3 (three) times daily as needed for anxiety. Patient not taking: Reported on 11/02/2021 09/27/21   Tegeler, Canary Brim, MD  LORazepam (ATIVAN) 1 MG tablet Take 1 tablet (1 mg total) by mouth 2 (two) times daily as needed for up to 5 doses for anxiety. 01/23/22   Terald Sleeper, MD      Allergies    Naproxen    Review of Systems   Review of Systems  Respiratory:  Positive for shortness of breath.     Physical Exam Updated Vital Signs BP (!) 154/93 (BP Location: Right Arm)   Pulse (!) 59   Temp 98.3 F (36.8 C) (Oral)   Resp 20   Ht 5\' 9"  (1.753 m)   Wt 90.3 kg   SpO2 100%   BMI 29.39 kg/m  Physical Exam  ED Results / Procedures / Treatments   Labs (all labs ordered are listed, but only abnormal results are displayed) Labs Reviewed - No data to display  EKG None  Radiology No results  found.  Procedures Procedures  {Document cardiac monitor, telemetry assessment procedure when appropriate:1}  Medications Ordered in ED Medications - No data to display  ED Course/ Medical Decision Making/ A&P   {   Click here for ABCD2, HEART and other calculatorsREFRESH Note before signing :1}                          Medical Decision Making  ***  {Document critical care time when appropriate:1} {Document review of labs and clinical decision tools ie heart score, Chads2Vasc2 etc:1}  {Document your independent review of radiology images, and any outside records:1} {Document your discussion with family members, caretakers, and with consultants:1} {Document social determinants of health affecting pt's care:1} {Document your decision making why or why not admission, treatments were needed:1} Final Clinical Impression(s) / ED Diagnoses Final diagnoses:  None    Rx / DC Orders ED Discharge Orders     None

## 2022-05-02 NOTE — ED Triage Notes (Addendum)
Pt arrives pov, to triage in wheelchair with c/o shob after going outside today. Endorses anxiety and claustrophobia. Also reports lower back pain and spasms. Pt drove self to ED. Pt reports out of medication (ativan)

## 2022-05-03 LAB — TROPONIN I (HIGH SENSITIVITY): Troponin I (High Sensitivity): 4 ng/L (ref <18)

## 2023-01-30 ENCOUNTER — Emergency Department (HOSPITAL_BASED_OUTPATIENT_CLINIC_OR_DEPARTMENT_OTHER)
Admission: EM | Admit: 2023-01-30 | Discharge: 2023-01-31 | Disposition: A | Discharge status: Home / Self Care | Payer: BC Managed Care – PPO | Attending: Emergency Medicine | Admitting: Emergency Medicine

## 2023-01-30 ENCOUNTER — Other Ambulatory Visit: Payer: Self-pay

## 2023-01-30 ENCOUNTER — Encounter (HOSPITAL_BASED_OUTPATIENT_CLINIC_OR_DEPARTMENT_OTHER): Payer: Self-pay | Admitting: Emergency Medicine

## 2023-01-30 ENCOUNTER — Emergency Department (HOSPITAL_BASED_OUTPATIENT_CLINIC_OR_DEPARTMENT_OTHER): Payer: BC Managed Care – PPO

## 2023-01-30 DIAGNOSIS — Z20822 Contact with and (suspected) exposure to covid-19: Secondary | ICD-10-CM | POA: Diagnosis not present

## 2023-01-30 DIAGNOSIS — R0602 Shortness of breath: Secondary | ICD-10-CM | POA: Diagnosis present

## 2023-01-30 DIAGNOSIS — J45909 Unspecified asthma, uncomplicated: Secondary | ICD-10-CM | POA: Diagnosis not present

## 2023-01-30 DIAGNOSIS — J441 Chronic obstructive pulmonary disease with (acute) exacerbation: Secondary | ICD-10-CM | POA: Diagnosis not present

## 2023-01-30 DIAGNOSIS — J101 Influenza due to other identified influenza virus with other respiratory manifestations: Secondary | ICD-10-CM | POA: Insufficient documentation

## 2023-01-30 LAB — CBC WITH DIFFERENTIAL/PLATELET
Abs Immature Granulocytes: 0.05 10*3/uL (ref 0.00–0.07)
Basophils Absolute: 0 10*3/uL (ref 0.0–0.1)
Basophils Relative: 0 %
Eosinophils Absolute: 0.1 10*3/uL (ref 0.0–0.5)
Eosinophils Relative: 1 %
HCT: 41.5 % (ref 39.0–52.0)
Hemoglobin: 15.2 g/dL (ref 13.0–17.0)
Immature Granulocytes: 1 %
Lymphocytes Relative: 25 %
Lymphs Abs: 2.5 10*3/uL (ref 0.7–4.0)
MCH: 28.9 pg (ref 26.0–34.0)
MCHC: 36.6 g/dL — ABNORMAL HIGH (ref 30.0–36.0)
MCV: 78.9 fL — ABNORMAL LOW (ref 80.0–100.0)
Monocytes Absolute: 0.5 10*3/uL (ref 0.1–1.0)
Monocytes Relative: 5 %
Neutro Abs: 6.7 10*3/uL (ref 1.7–7.7)
Neutrophils Relative %: 68 %
Platelets: 306 10*3/uL (ref 150–400)
RBC: 5.26 MIL/uL (ref 4.22–5.81)
RDW: 12.7 % (ref 11.5–15.5)
WBC: 9.9 10*3/uL (ref 4.0–10.5)
nRBC: 0 % (ref 0.0–0.2)

## 2023-01-30 LAB — RESP PANEL BY RT-PCR (RSV, FLU A&B, COVID)  RVPGX2
Influenza A by PCR: POSITIVE — AB
Influenza B by PCR: NEGATIVE
Resp Syncytial Virus by PCR: NEGATIVE
SARS Coronavirus 2 by RT PCR: NEGATIVE

## 2023-01-30 LAB — COMPREHENSIVE METABOLIC PANEL
ALT: 28 U/L (ref 0–44)
AST: 35 U/L (ref 15–41)
Albumin: 4 g/dL (ref 3.5–5.0)
Alkaline Phosphatase: 71 U/L (ref 38–126)
Anion gap: 11 (ref 5–15)
BUN: 12 mg/dL (ref 6–20)
CO2: 23 mmol/L (ref 22–32)
Calcium: 8.4 mg/dL — ABNORMAL LOW (ref 8.9–10.3)
Chloride: 96 mmol/L — ABNORMAL LOW (ref 98–111)
Creatinine, Ser: 0.98 mg/dL (ref 0.61–1.24)
GFR, Estimated: >60 mL/min (ref >60)
Glucose, Bld: 246 mg/dL — ABNORMAL HIGH (ref 70–99)
Potassium: 3.4 mmol/L — ABNORMAL LOW (ref 3.5–5.1)
Sodium: 130 mmol/L — ABNORMAL LOW (ref 135–145)
Total Bilirubin: 0.7 mg/dL (ref 0.0–1.2)
Total Protein: 7.8 g/dL (ref 6.5–8.1)

## 2023-01-30 LAB — BRAIN NATRIURETIC PEPTIDE: B Natriuretic Peptide: 14.4 pg/mL (ref 0.0–100.0)

## 2023-01-30 LAB — TROPONIN I (HIGH SENSITIVITY): Troponin I (High Sensitivity): 4 ng/L (ref <18)

## 2023-01-30 MED ORDER — IPRATROPIUM-ALBUTEROL 0.5-2.5 (3) MG/3ML IN SOLN
3.0000 mL | Freq: Once | RESPIRATORY_TRACT | Status: AC
  Administered 2023-01-30: 3 mL via RESPIRATORY_TRACT
  Filled 2023-01-30: qty 3

## 2023-01-30 MED ORDER — POTASSIUM CHLORIDE CRYS ER 20 MEQ PO TBCR
40.0000 meq | EXTENDED_RELEASE_TABLET | Freq: Once | ORAL | Status: AC
  Administered 2023-01-30: 40 meq via ORAL
  Filled 2023-01-30: qty 2

## 2023-01-30 MED ORDER — ALBUTEROL SULFATE (2.5 MG/3ML) 0.083% IN NEBU
2.5000 mg | INHALATION_SOLUTION | Freq: Once | RESPIRATORY_TRACT | Status: AC
  Administered 2023-01-30: 2.5 mg via RESPIRATORY_TRACT
  Filled 2023-01-30: qty 3

## 2023-01-30 MED ORDER — METHYLPREDNISOLONE SODIUM SUCC 125 MG IJ SOLR
125.0000 mg | Freq: Once | INTRAMUSCULAR | Status: AC
  Administered 2023-01-30: 125 mg via INTRAVENOUS
  Filled 2023-01-30: qty 2

## 2023-01-30 MED ORDER — OSELTAMIVIR PHOSPHATE 75 MG PO CAPS
75.0000 mg | ORAL_CAPSULE | Freq: Once | ORAL | Status: AC
  Administered 2023-01-30: 75 mg via ORAL
  Filled 2023-01-30: qty 1

## 2023-01-30 MED ORDER — ALBUTEROL SULFATE HFA 108 (90 BASE) MCG/ACT IN AERS
2.0000 | INHALATION_SPRAY | RESPIRATORY_TRACT | Status: DC | PRN
  Administered 2023-01-30: 2 via RESPIRATORY_TRACT
  Filled 2023-01-30: qty 6.7

## 2023-01-30 NOTE — ED Triage Notes (Addendum)
 Patient presents with sob, chest pain and intermittent dizziness x few days. Reports he was seen at [facility] last week and advised to come to ED if sx did not improve. States they said I had borderline pneumonia. Patient reports hx COPD and Asthma. Very anxious while in triage. States anxiety gets the best of me at times

## 2023-01-30 NOTE — ED Provider Notes (Signed)
 [facility] Provider Note   CSN: [id number] Arrival date & time: 01/30/23  2020     History  Chief Complaint  Patient presents with   Shortness of Breath    Malon Branton is a 63 y.o. male.  HPI     63 year old male with a history of COPD/asthma, anxiety who presents with concern for shortness of breath and cough.  Reports his symptoms began about 10 days ago with cough, shortness of breath, wheezing.  He is went to the urgent care on Thursday and was told that he might have some pneumonia and was given an antibiotic and steroids and has been taking those but feels the symptoms are getting worse.  Has sensation of chest pressure shortness of breath.  His cough has been productive and worsening, reporting yellow to green sputum.  Denies nausea, vomiting, leg pain or swelling.  Has had lightheadedness but no syncope.  Feels like there is fluid on his lungs.  He does not have a history of congestive heart failure.  Reports that he starts to feel shortness of breath he begins to feel anxiety and panic.   No history of DVT or PE, recent travel or immobilization  He used to smoke cigarettes, no recent alcohol use, reports occasional use prior, no other drug use  Past Medical History:  Diagnosis Date   Anxiety    Asthma    COPD (chronic obstructive pulmonary disease) (HCC)    Phobia      Home Medications Prior to Admission medications   Medication Sig Start Date End Date Taking? Authorizing Provider  azithromycin  (ZITHROMAX ) 250 MG tablet Take 1 tablet (250 mg total) by mouth daily. Take first 2 tablets together, then 1 every day until finished. 01/31/23  Yes Dreama Longs, MD  oseltamivir  (TAMIFLU ) 75 MG capsule Take 1 capsule (75 mg total) by mouth every 12 (twelve) hours for 5 days. 01/31/23 02/05/23 Yes Dreama Longs, MD  predniSONE  (DELTASONE ) 10 MG tablet Take 4 tablets (40 mg total) by mouth daily for 4 days. 01/31/23 02/04/23  Yes Dreama Longs, MD  albuterol  (VENTOLIN  HFA) 108 (90 Base) MCG/ACT inhaler Inhale 2 puffs into the lungs every 6 (six) hours as needed for wheezing or shortness of breath.    [provider]  budesonide -formoterol  (SYMBICORT ) 160-4.5 MCG/ACT inhaler Inhale 2 puffs into the lungs 2 (two) times daily. 12/31/21   Gladis Leonor HERO, MD  escitalopram (LEXAPRO) 20 MG tablet Take 20 mg by mouth daily.    [provider]  ibuprofen  (ADVIL ) 600 MG tablet Take 1 tablet (600 mg total) by mouth every 6 (six) hours as needed. 03/07/20   Nivia Colon, PA-C  LORazepam  (ATIVAN ) 1 MG tablet Take 1 tablet (1 mg total) by mouth 3 (three) times daily as needed for anxiety. Patient not taking: Reported on 11/02/2021 09/27/21   Tegeler, Lonni PARAS, MD  LORazepam  (ATIVAN ) 1 MG tablet Take 1 tablet (1 mg total) by mouth 2 (two) times daily as needed for up to 5 doses for anxiety. 01/23/22   Cottie Donnice PARAS, MD      Allergies    Naproxen    Review of Systems   Review of Systems  Physical Exam Updated Vital Signs BP 118/67 (BP Location: Right Arm)   Pulse 79   Temp 98.9 F (37.2 C) (Oral)   Resp 15   Ht 5' 9 (1.753 m)   Wt 90.7 kg   SpO2 100%   BMI 29.53  kg/m  Physical Exam Vitals and nursing note reviewed.  Constitutional:      General: He is not in acute distress.    Appearance: He is well-developed. He is not diaphoretic.     Comments: anxious  HENT:     Head: Normocephalic and atraumatic.  Eyes:     Conjunctiva/sclera: Conjunctivae normal.  Neck:     Vascular: No JVD.  Cardiovascular:     Rate and Rhythm: Normal rate and regular rhythm.     Heart sounds: Normal heart sounds. No murmur heard.    No friction rub. No gallop.  Pulmonary:     Effort: Pulmonary effort is normal. Tachypnea (mild) present. No respiratory distress.     Breath sounds: Rhonchi (diffuse) present. No wheezing or rales.  Abdominal:     General: There is no distension.     Palpations: Abdomen is  soft.     Tenderness: There is no abdominal tenderness. There is no guarding.  Musculoskeletal:     Cervical back: Normal range of motion.     Right lower leg: No edema.     Left lower leg: No edema.  Skin:    General: Skin is warm and dry.  Neurological:     Mental Status: He is alert and oriented to person, place, and time.     ED Results / Procedures / Treatments   Labs (all labs ordered are listed, but only abnormal results are displayed) Labs Reviewed  RESP PANEL BY RT-PCR (RSV, FLU A&B, COVID)  RVPGX2 - Abnormal; Notable for the following components:      Result Value   Influenza A by PCR POSITIVE (*)    All other components within normal limits  CBC WITH DIFFERENTIAL/PLATELET - Abnormal; Notable for the following components:   MCV 78.9 (*)    MCHC 36.6 (*)    All other components within normal limits  COMPREHENSIVE METABOLIC PANEL - Abnormal; Notable for the following components:   Sodium 130 (*)    Potassium 3.4 (*)    Chloride 96 (*)    Glucose, Bld 246 (*)    Calcium 8.4 (*)    All other components within normal limits  BRAIN NATRIURETIC PEPTIDE  TROPONIN I (HIGH SENSITIVITY)    EKG EKG Interpretation Date/Time:  Monday January 30 2023 20:28:42 EST Ventricular Rate:  84 PR Interval:  178 QRS Duration:  88 QT Interval:  360 QTC Calculation: 425 R Axis:   25  Text Interpretation: Normal sinus rhythm No significant change since last tracing Confirmed by Bernard Drivers ([id number]) on 01/30/2023 8:38:15 PM  Radiology DG Chest 2 View Result Date: 01/30/2023 CLINICAL DATA:  Shortness of breath, chest pain, dizziness EXAM: CHEST - 2 VIEW COMPARISON:  05/02/2022 FINDINGS: Mild bibasilar atelectasis. No focal consolidation. No pleural effusion or pneumothorax. The heart is normal in size. Visualized osseous structures are within normal limits. IMPRESSION: Mild bibasilar atelectasis. Electronically Signed   By: Pinkie Pebbles M.D.   On: 01/30/2023 21:46     Procedures Procedures    Medications Ordered in ED Medications  ipratropium-albuterol  (DUONEB) 0.5-2.5 (3) MG/3ML nebulizer solution 3 mL (3 mLs Nebulization Given 01/30/23 2041)  albuterol  (PROVENTIL ) (2.5 MG/3ML) 0.083% nebulizer solution 2.5 mg (2.5 mg Nebulization Given 01/30/23 2041)  methylPREDNISolone  sodium succinate (SOLU-MEDROL ) 125 mg/2 mL injection 125 mg (125 mg Intravenous Given 01/30/23 2149)  albuterol  (PROVENTIL ) (2.5 MG/3ML) 0.083% nebulizer solution 2.5 mg (2.5 mg Nebulization Given 01/30/23 2130)  albuterol  (PROVENTIL ) (2.5 MG/3ML) 0.083% nebulizer solution 2.5 mg (  2.5 mg Nebulization Given 01/30/23 2229)  potassium chloride  SA (KLOR-CON  M) CR tablet 40 mEq (40 mEq Oral Given 01/30/23 2314)  oseltamivir  (TAMIFLU ) capsule 75 mg (75 mg Oral Given 01/30/23 2314)    ED Course/ Medical Decision Making/ A&P                                   63 year old male with a history of COPD/asthma, anxiety who presents with concern for shortness of breath and cough.  Differential diagnosis for dyspnea includes ACS, PE, COPD exacerbation, CHF exacerbation, anemia, pneumonia, viral etiology such as COVID 19 infection, metabolic abnormality.    EKG completed and personally eval and interpreted by me shows a normal sinus rhythm without acute changes in comparison to prior.  Labs completed and personally about interpreted by me show no signs of clinically significant anemia, electrolyte abnormality.  Troponin negative, doubt ACS.  BNP normal doubt CHF exacerbation. Influenza testing positive.   CXR evaluated by me and shows no sign of pneumonia, edema or other abnormalities.   Suspect COPD exacerbation secondary to influenza.  Given tamilflu, prednisone  and azithromycin  for COPD exacerbation.  Improved with solumedrol and nebulizers in ED and stable for outpatient treatment. Discussed strict return precautions. Patient discharged in stable condition with understanding of reasons to  return.          Final Clinical Impression(s) / ED Diagnoses Final diagnoses:  COPD exacerbation (HCC)  Influenza A    Rx / DC Orders ED Discharge Orders          Ordered    azithromycin  (ZITHROMAX ) 250 MG tablet  Daily        01/31/23 0017    predniSONE  (DELTASONE ) 10 MG tablet  Daily        01/31/23 0017    oseltamivir  (TAMIFLU ) 75 MG capsule  Every 12 hours        01/31/23 0017              Dreama Longs, MD 01/31/23 1113

## 2023-01-31 MED ORDER — PREDNISONE 10 MG PO TABS: 40.0000 mg | ORAL_TABLET | Freq: Every day | ORAL | 0 refills | Status: AC

## 2023-01-31 MED ORDER — OSELTAMIVIR PHOSPHATE 75 MG PO CAPS: 75.0000 mg | ORAL_CAPSULE | Freq: Two times a day (BID) | ORAL | 0 refills | Status: AC

## 2023-01-31 MED ORDER — AZITHROMYCIN 250 MG PO TABS: 250.0000 mg | ORAL_TABLET | Freq: Every day | ORAL | 0 refills | Status: AC

## 2023-09-21 LAB — COLOGUARD: COLOGUARD: POSITIVE — AB

## 2023-11-06 ENCOUNTER — Other Ambulatory Visit: Payer: Self-pay | Admitting: Gastroenterology

## 2023-11-06 DIAGNOSIS — K6389 Other specified diseases of intestine: Secondary | ICD-10-CM

## 2023-11-06 DIAGNOSIS — R109 Unspecified abdominal pain: Secondary | ICD-10-CM

## 2023-11-06 DIAGNOSIS — R195 Other fecal abnormalities: Secondary | ICD-10-CM

## 2023-11-09 NOTE — Progress Notes (Signed)
 PATIENT NAVIGATOR PROGRESS NOTE  Name: Terry Mckinney Date: 11/09/2023 MRN: [id number]  DOB: [birth year]   Reason for visit:  Introductory Phone Call  Comments:   Called patient to introduce myself and explained my role in his care.  Was able to answer some of patient's questions regarding what to expect during his initial visit with Powell Lessen, NP and Dr. Onita Mattock on 10/30.  Patient stated he is nervous about the upcoming scan and requested a call with the results prior to his appt on 10/30.  Agreed to call patient for follow-up next week with scan results.  Navigation needs were assessed and patient was agreeable to have Social Work reach out to patient.  Message sent to Social Work.  Patient was given my direct contact information and was instructed to contact office with any questions or concerns.   Time spent counseling/coordinating care: 45-60 minutes

## 2023-11-10 ENCOUNTER — Ambulatory Visit
Admission: RE | Admit: 2023-11-10 | Discharge: 2023-11-10 | Disposition: A | Discharge status: Home / Self Care | Source: Ambulatory Visit | Attending: Gastroenterology | Admitting: Gastroenterology

## 2023-11-10 DIAGNOSIS — K6389 Other specified diseases of intestine: Secondary | ICD-10-CM

## 2023-11-10 DIAGNOSIS — R109 Unspecified abdominal pain: Secondary | ICD-10-CM

## 2023-11-10 DIAGNOSIS — R195 Other fecal abnormalities: Secondary | ICD-10-CM

## 2023-11-10 MED ORDER — IOPAMIDOL (ISOVUE-370) INJECTION 76%
80.0000 mL | Freq: Once | INTRAVENOUS | Status: AC | PRN
  Administered 2023-11-10: 80 mL via INTRAVENOUS

## 2023-11-14 NOTE — Progress Notes (Signed)
 PATIENT NAVIGATOR PROGRESS NOTE  Name: Terry Mckinney Date: 11/14/2023 MRN: [id number]  DOB: 05-12-1960   During last phone call with patient on 10/23, patient had requested to be notified of scan results.  Called and read to patient the impression results from his scan on 10/24.  Patient was informed that scan will be reviewed in more detail with Powell Lessen, NP and Dr. Lanny during his initial Med/Onc appointment on 10/30.  Patient verbalized understanding.    Time spent counseling/coordinating care: 30-45 minutes

## 2023-11-15 DIAGNOSIS — C187 Malignant neoplasm of sigmoid colon: Secondary | ICD-10-CM | POA: Insufficient documentation
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
## 2023-11-16 ENCOUNTER — Inpatient Hospital Stay: Admitting: Licensed Clinical Social Worker

## 2023-11-16 ENCOUNTER — Inpatient Hospital Stay

## 2023-11-16 ENCOUNTER — Inpatient Hospital Stay: Attending: Nurse Practitioner | Admitting: Nurse Practitioner

## 2023-11-16 VITALS — BP 137/92 | HR 60 | Temp 98.0°F | Resp 17 | Ht 69.0 in | Wt 196.0 lb

## 2023-11-16 DIAGNOSIS — Z87891 Personal history of nicotine dependence: Secondary | ICD-10-CM | POA: Insufficient documentation

## 2023-11-16 DIAGNOSIS — F419 Anxiety disorder, unspecified: Secondary | ICD-10-CM | POA: Insufficient documentation

## 2023-11-16 DIAGNOSIS — F32A Depression, unspecified: Secondary | ICD-10-CM | POA: Insufficient documentation

## 2023-11-16 DIAGNOSIS — C187 Malignant neoplasm of sigmoid colon: Secondary | ICD-10-CM | POA: Insufficient documentation

## 2023-11-16 DIAGNOSIS — J45909 Unspecified asthma, uncomplicated: Secondary | ICD-10-CM | POA: Diagnosis not present

## 2023-11-16 DIAGNOSIS — Z79899 Other long term (current) drug therapy: Secondary | ICD-10-CM | POA: Diagnosis not present

## 2023-11-16 LAB — CEA (ACCESS): CEA (CHCC): 5.36 ng/mL — ABNORMAL HIGH (ref 0.00–5.00)

## 2023-11-16 LAB — CMP (CANCER CENTER ONLY)
ALT: 19 U/L (ref 0–44)
AST: 20 U/L (ref 15–41)
Albumin: 4.2 g/dL (ref 3.5–5.0)
Alkaline Phosphatase: 91 U/L (ref 38–126)
Anion gap: 8 (ref 5–15)
BUN: 11 mg/dL (ref 8–23)
CO2: 27 mmol/L (ref 22–32)
Calcium: 9.4 mg/dL (ref 8.9–10.3)
Chloride: 103 mmol/L (ref 98–111)
Creatinine: 1.04 mg/dL (ref 0.61–1.24)
GFR, Estimated: >60 mL/min (ref >60)
Glucose, Bld: 157 mg/dL — ABNORMAL HIGH (ref 70–99)
Potassium: 4.1 mmol/L (ref 3.5–5.1)
Sodium: 138 mmol/L (ref 135–145)
Total Bilirubin: 1.2 mg/dL (ref 0.0–1.2)
Total Protein: 7.7 g/dL (ref 6.5–8.1)

## 2023-11-16 LAB — CBC WITH DIFFERENTIAL (CANCER CENTER ONLY)
Abs Immature Granulocytes: 0.02 K/uL (ref 0.00–0.07)
Basophils Absolute: 0.1 K/uL (ref 0.0–0.1)
Basophils Relative: 1 %
Eosinophils Absolute: 0.3 K/uL (ref 0.0–0.5)
Eosinophils Relative: 4 %
HCT: 40.8 % (ref 39.0–52.0)
Hemoglobin: 14.9 g/dL (ref 13.0–17.0)
Immature Granulocytes: 0 %
Lymphocytes Relative: 35 %
Lymphs Abs: 2.2 K/uL (ref 0.7–4.0)
MCH: 28.5 pg (ref 26.0–34.0)
MCHC: 36.5 g/dL — ABNORMAL HIGH (ref 30.0–36.0)
MCV: 78 fL — ABNORMAL LOW (ref 80.0–100.0)
Monocytes Absolute: 0.5 K/uL (ref 0.1–1.0)
Monocytes Relative: 8 %
Neutro Abs: 3.1 K/uL (ref 1.7–7.7)
Neutrophils Relative %: 52 %
Platelet Count: 286 K/uL (ref 150–400)
RBC: 5.23 MIL/uL (ref 4.22–5.81)
RDW: 13.2 % (ref 11.5–15.5)
WBC Count: 6.2 K/uL (ref 4.0–10.5)
nRBC: 0 % (ref 0.0–0.2)

## 2023-11-16 LAB — IRON AND IRON BINDING CAPACITY (CC-WL,HP ONLY)
Iron: 119 ug/dL (ref 45–182)
Saturation Ratios: 31 % (ref 17.9–39.5)
TIBC: 379 ug/dL (ref 250–450)
UIBC: 260 ug/dL (ref 117–376)

## 2023-11-16 LAB — FERRITIN: Ferritin: 54 ng/mL (ref 24–336)
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
## 2023-11-16 NOTE — Progress Notes (Signed)
 PATIENT NAVIGATOR PROGRESS NOTE  Name: Terry Mckinney Date: 11/16/2023 MRN: [id number]  DOB: 1960-11-17   Reason for visit:  Initial Med/Onc visit with Powell Lessen, NP and Dr. Onita Mattock  Comments:   Patient was seen during his initial visit with [facility] and Dr. Mattock. Patient was accompanied by his wife. Patient was given Journey's binder with information specific to his diagnosis.  Patient was given my direct contact information and was instructed to contact office with any questions or concerns.   Time spent counseling/coordinating care: > 60 minutes

## 2023-11-16 NOTE — Progress Notes (Signed)
 [facility] Clinical Social Work  Initial Assessment   Terry Mckinney is a 63 y.o. year old male accompanied by patient and wife. Clinical Social Work was referred by medical provider for assessment of psychosocial needs.   SDOH (Social Determinants of Health) assessments performed: No   SDOH Screenings   Food Insecurity: No Food Insecurity (05/09/2022)   Received from [facility]  Housing: Unknown (11/14/2023)   Received from [facility]  Transportation Needs: No Transportation Needs (05/09/2022)   Received from [facility]  Utilities: Not At Risk (05/09/2022)   Received from [facility]  Financial Resource Strain: Low Risk  (05/09/2022)   Received from [facility]  Social Connections: Unknown (09/24/2021)   Received from [facility]  Tobacco Use: Medium Risk (11/14/2023)   Received from [facility]    PHQ 2/9:     No data to display           Distress Screen completed: No     No data to display            Family/Social Information:  Housing Arrangement: patient lives with his wife.  Family members/support persons in your life? Pt has 3 adult children who will be available to provide additional support if needed.   Transportation concerns: no  Employment: Working full time pt employed as a engineer, building services.  Pt reports his job is flexible and it will not be required for him to engage in physically strenuous activity if he is unable to tolerate it.  Pt has both short term and long term disability benefits.   Income source: Employment Financial concerns: No Type of concern: None Food access concerns: no Religious or spiritual practice: Not known Advanced directives: Not known Services Currently in place:  none  Coping/ Adjustment to diagnosis: Patient understands treatment plan and what happens next? yes, pt scheduled for resection at [facility] on December 10th.  Further treatment to be determined following  surgery. Concerns about diagnosis and/or treatment: Overwhelmed by information Patient reported stressors: Adjusting to my illness and Physical issues Hopes and/or priorities: pt's priority is to have surgery w/ the hope of positive results. Patient enjoys time with family/ friends Current coping skills/ strengths: Capable of independent living , Motivation for treatment/growth , Physical Health , and Supportive family/friends     SUMMARY: Current SDOH Barriers:  No barriers identified at this time.  Clinical Social Work Clinical Goal(s):  No clinical social work goals at this time  Interventions: Discussed common feeling and emotions when being diagnosed with cancer, and the importance of support during treatment Informed patient of the support team roles and support services at [facility] Provided CSW contact information and encouraged patient to call with any questions or concerns    Follow Up Plan: Patient will contact CSW with any support or resource needs Patient verbalizes understanding of plan: Yes    Devere JONELLE Manna, LCSW Clinical Social Worker [facility]

## 2023-11-23 ENCOUNTER — Telehealth: Payer: Self-pay | Admitting: Nurse Practitioner

## 2023-11-23 NOTE — Telephone Encounter (Signed)
 Called and LVM regarding  his appt and times.

## 2023-11-23 NOTE — Progress Notes (Signed)
 Opened in error

## 2023-12-11 ENCOUNTER — Inpatient Hospital Stay: Attending: Nurse Practitioner

## 2023-12-11 NOTE — Progress Notes (Signed)
 Surgery orders requested via Epic inbox.

## 2023-12-11 NOTE — Progress Notes (Signed)
 Nutrition  Patient did not show up for scheduled nutrition appointment.  Will send message to scheduling to offer another appointment.   Kiylah Loyer B. Dasie SOLON, CSO, LDN Registered Dietitian [phone]

## 2023-12-13 NOTE — Patient Instructions (Addendum)
 SURGICAL WAITING ROOM VISITATION  Patients having surgery or a procedure may have no more than 2 support people in the waiting area - these visitors may rotate.    Children under the age of 33 must have an adult with them who is not the patient.  Visitors with respiratory illnesses are discouraged from visiting and should remain at home.  If the patient needs to stay at the hospital during part of their recovery, the visitor guidelines for inpatient rooms apply. Pre-op nurse will coordinate an appropriate time for 1 support person to accompany patient in pre-op.  This support person may not rotate.    Please refer to the [facility] website for the visitor guidelines for Inpatients (after your surgery is over and you are in a regular room).       Your procedure is scheduled on: 12/27/23   Report to [facility] Main Entrance    Report to admitting at 10:35 AM   Call this number if you have problems the morning of surgery [phone]   Do not eat food :After Midnight.   After Midnight you may have the following liquids until 9:50 AM DAY OF SURGERY  Water Non-Citrus Juices (without pulp, NO RED-Apple, White grape, White cranberry) Black Coffee (NO MILK/CREAM OR CREAMERS, sugar ok)  Clear Tea (NO MILK/CREAM OR CREAMERS, sugar ok) regular and decaf                             Plain Jell-O (NO RED)                                           Fruit ices (not with fruit pulp, NO RED)                                     Popsicles (NO RED)                                                               Sports drinks like Gatorade (NO RED)              Drink 2 Ensure/G2 drinks AT 10:00 PM the night before surgery.        The day of surgery:  Drink ONE (1) Pre-Surgery G2 at AM the morning of surgery. Drink in one sitting. Do not sip.  This drink was given to you during your hospital  pre-op appointment visit. Nothing else to drink after completing the  Pre-Surgery  G2.           If you have questions, please contact your surgeon's office.   FOLLOW BOWEL PREP AND ANY ADDITIONAL PRE OP INSTRUCTIONS YOU RECEIVED FROM YOUR SURGEON'S OFFICE!!!     Oral Hygiene is also important to reduce your risk of infection.                                    Remember - BRUSH YOUR TEETH THE MORNING OF SURGERY WITH YOUR REGULAR  TOOTHPASTE      Stop all vitamins and herbal supplements 7 days before surgery.   Take these medicines the morning of surgery with A SIP OF WATER: tylenol  if needed, alprazolam for anxiety, inhaler if needed.  DO NOT TAKE ANY ORAL DIABETIC MEDICATIONS DAY OF YOUR SURGERY Hol Glipizide/Metformin(Metaglip) the morning of surgery.              You may not have any metal on your body including hair pins, jewelry, and body piercing             Do not wear make-up, lotions, powders, perfumes/cologne, or deodorant              Men may shave face and neck.   Do not bring valuables to the hospital. [facility] IS NOT             RESPONSIBLE   FOR VALUABLES.   Contacts, glasses, dentures or bridgework may not be worn into surgery.   Bring small overnight bag day of surgery.   DO NOT BRING YOUR HOME MEDICATIONS TO THE HOSPITAL. PHARMACY WILL DISPENSE MEDICATIONS LISTED ON YOUR MEDICATION LIST TO YOU DURING YOUR ADMISSION IN THE HOSPITAL!    Patients discharged on the day of surgery will not be allowed to drive home.  Someone NEEDS to stay with you for the first 24 hours after anesthesia.   Special Instructions: Bring a copy of your healthcare power of attorney and living will documents the day of surgery if you haven't scanned them before.              Please read over the following fact sheets you were given: IF YOU HAVE QUESTIONS ABOUT YOUR PRE-OP INSTRUCTIONS PLEASE CALL [phone] [facility]   If you received a COVID test during your pre-op visit  it is requested that you wear a mask when out in public, stay away from anyone that may not be feeling well  and notify your surgeon if you develop symptoms. If you test positive for Covid or have been in contact with anyone that has tested positive in the last 10 days please notify you surgeon.    [facility] - Preparing for Surgery Before surgery, you can play an important role.  Because skin is not sterile, your skin needs to be as free of germs as possible.  You can reduce the number of germs on your skin by washing with CHG (chlorahexidine gluconate) soap before surgery.  CHG is an antiseptic cleaner which kills germs and bonds with the skin to continue killing germs even after washing. Please DO NOT use if you have an allergy to CHG or antibacterial soaps.  If your skin becomes reddened/irritated stop using the CHG and inform your nurse when you arrive at Short Stay. Do not shave (including legs and underarms) for at least 48 hours prior to the first CHG shower.  You may shave your face/neck.  Please follow these instructions carefully:  1.  Shower with CHG Soap the night before surgery ONLY (DO NOT USE THE SOAP THE MORNING OF SURGERY).  2.  If you choose to wash your hair, wash your hair first as usual with your normal  shampoo.  3.  After you shampoo, rinse your hair and body thoroughly to remove the shampoo.                             4.  Use CHG as you  would any other liquid soap.  You can apply chg directly to the skin and wash.  Gently with a scrungie or clean washcloth.  5.  Apply the CHG Soap to your body ONLY FROM THE NECK DOWN.   Do   not use on face/ open                           Wound or open sores. Avoid contact with eyes, ears mouth and   genitals (private parts).                       Wash face,  Genitals (private parts) with your normal soap.             6.  Wash thoroughly, paying special attention to the area where your    surgery  will be performed.  7.  Thoroughly rinse your body with warm water from the neck down.  8.  DO NOT shower/wash with your normal soap after using and  rinsing off the CHG Soap.                9.  Pat yourself dry with a clean towel.            10.  Wear clean pajamas.            11.  Place clean sheets on your bed the night of your first shower and do not  sleep with pets. Day of Surgery : Do not apply any CHG, lotions/deodorants the morning of surgery.  Please wear clean clothes to the hospital/surgery center.  FAILURE TO FOLLOW THESE INSTRUCTIONS MAY RESULT IN THE CANCELLATION OF YOUR SURGERY  PATIENT SIGNATURE_________________________________  NURSE SIGNATURE__________________________________  ________________________________________________________________________

## 2023-12-13 NOTE — Progress Notes (Addendum)
 COVID Vaccine received:  []  No [x]  Yes Date of any COVID positive Test in last 90 days: no PCP - Annabella Grosser NP Cardiologist - n/a  Chest x-ray - CT chest- 11/10/23 EKG - 02/01/23 Epic  Stress Test -  ECHO -  Cardiac Cath -   Bowel Prep - [x]  No  []   Yes ______  Pacemaker / ICD device [x]  No []  Yes   Spinal Cord Stimulator:[x]  No []  Yes       History of Sleep Apnea? [x]  No []  Yes   CPAP used?- [x]  No []  Yes    Does the patient monitor blood sugar?          [x]  No []  Yes  []  N/A  Patient has: []  NO Hx DM   []  Pre-DM                 []  DM1  [x]   DM2 Does patient have a [employer] or Dexacom? [x]  No []  Yes   Fasting Blood Sugar Ranges-  Checks Blood Sugar ___0__ times a day  GLP1 agonist / usual dose - no GLP1 instructions:  SGLT-2 inhibitors / usual dose - no SGLT-2 instructions:   Blood Thinner / Instructions:no Aspirin Instructions:no  Comments:   Activity level: Patient is able  to climb a flight of stairs without difficulty; [x]  No CP  []  No SOB,    Patient can perform ADLs without assistance.   Anesthesia review:   Patient denies shortness of breath, fever, cough and chest pain at PAT appointment.  Patient verbalized understanding and agreement to the Pre-Surgical Instructions that were given to them at this PAT appointment. Patient was also educated of the need to review these PAT instructions again prior to his/her surgery.I reviewed the appropriate phone numbers to call if they have any and questions or concerns.

## 2023-12-13 NOTE — Progress Notes (Signed)
 Request sent to Dr. KYM Pizza to send pre op orders for PST visit 12/18/23.

## 2023-12-13 NOTE — Progress Notes (Signed)
 Sent message, via epic in basket, requesting orders in epic from Careers adviser.

## 2023-12-18 ENCOUNTER — Encounter (HOSPITAL_COMMUNITY)
Admission: RE | Admit: 2023-12-18 | Discharge: 2023-12-18 | Disposition: A | Source: Ambulatory Visit | Attending: Surgery

## 2023-12-18 ENCOUNTER — Encounter (HOSPITAL_COMMUNITY): Payer: Self-pay

## 2023-12-18 ENCOUNTER — Other Ambulatory Visit: Payer: Self-pay

## 2023-12-18 VITALS — BP 132/89 | HR 54 | Temp 97.9°F | Resp 16 | Ht 69.0 in | Wt 198.0 lb

## 2023-12-18 DIAGNOSIS — Z01818 Encounter for other preprocedural examination: Secondary | ICD-10-CM | POA: Insufficient documentation

## 2023-12-18 DIAGNOSIS — E119 Type 2 diabetes mellitus without complications: Secondary | ICD-10-CM | POA: Insufficient documentation

## 2023-12-18 HISTORY — DX: Dyspnea, unspecified: R06.00

## 2023-12-18 HISTORY — DX: Malignant (primary) neoplasm, unspecified: C80.1

## 2023-12-18 HISTORY — DX: Unspecified osteoarthritis, unspecified site: M19.90

## 2023-12-18 LAB — CBC
HCT: 43.9 % (ref 39.0–52.0)
Hemoglobin: 15.9 g/dL (ref 13.0–17.0)
MCH: 28.7 pg (ref 26.0–34.0)
MCHC: 36.2 g/dL — ABNORMAL HIGH (ref 30.0–36.0)
MCV: 79.2 fL — ABNORMAL LOW (ref 80.0–100.0)
Platelets: 321 K/uL (ref 150–400)
RBC: 5.54 MIL/uL (ref 4.22–5.81)
RDW: 13.3 % (ref 11.5–15.5)
WBC: 5.8 K/uL (ref 4.0–10.5)
nRBC: 0 % (ref 0.0–0.2)

## 2023-12-18 LAB — BASIC METABOLIC PANEL WITH GFR
Anion gap: 12 (ref 5–15)
BUN: 13 mg/dL (ref 8–23)
CO2: 22 mmol/L (ref 22–32)
Calcium: 9.3 mg/dL (ref 8.9–10.3)
Chloride: 99 mmol/L (ref 98–111)
Creatinine, Ser: 0.92 mg/dL (ref 0.61–1.24)
GFR, Estimated: >60 mL/min (ref >60)
Glucose, Bld: 276 mg/dL — ABNORMAL HIGH (ref 70–99)
Potassium: 4.4 mmol/L (ref 3.5–5.1)
Sodium: 133 mmol/L — ABNORMAL LOW (ref 135–145)

## 2023-12-18 LAB — GLUCOSE, CAPILLARY: Glucose-Capillary: 295 mg/dL — ABNORMAL HIGH (ref 70–99)

## 2023-12-19 ENCOUNTER — Ambulatory Visit: Payer: Self-pay | Admitting: Surgery

## 2023-12-19 LAB — HEMOGLOBIN A1C
Hgb A1c MFr Bld: 8.9 % — ABNORMAL HIGH (ref 4.8–5.6)
Mean Plasma Glucose: 209 mg/dL

## 2023-12-19 NOTE — Progress Notes (Signed)
 Request sent to Dr. KYM Pizza to view pt's pre op A1c from 12/18/23.

## 2023-12-27 ENCOUNTER — Encounter (HOSPITAL_COMMUNITY): Admission: RE | Disposition: A | Payer: Self-pay | Source: Ambulatory Visit | Attending: Surgery

## 2023-12-27 ENCOUNTER — Encounter (HOSPITAL_COMMUNITY): Payer: Self-pay | Admitting: Medical

## 2023-12-27 ENCOUNTER — Encounter (HOSPITAL_COMMUNITY): Payer: Self-pay | Admitting: Surgery

## 2023-12-27 ENCOUNTER — Inpatient Hospital Stay (HOSPITAL_COMMUNITY): Payer: Self-pay | Admitting: Registered Nurse

## 2023-12-27 ENCOUNTER — Inpatient Hospital Stay (HOSPITAL_COMMUNITY)
Admission: RE | Admit: 2023-12-27 | Discharge: 2023-12-29 | DRG: 331 | Disposition: A | Source: Ambulatory Visit | Attending: Surgery | Admitting: Surgery

## 2023-12-27 ENCOUNTER — Other Ambulatory Visit: Payer: Self-pay

## 2023-12-27 DIAGNOSIS — Z7984 Long term (current) use of oral hypoglycemic drugs: Secondary | ICD-10-CM

## 2023-12-27 DIAGNOSIS — M1990 Unspecified osteoarthritis, unspecified site: Secondary | ICD-10-CM | POA: Diagnosis present

## 2023-12-27 DIAGNOSIS — K5730 Diverticulosis of large intestine without perforation or abscess without bleeding: Secondary | ICD-10-CM | POA: Diagnosis present

## 2023-12-27 DIAGNOSIS — Z79899 Other long term (current) drug therapy: Secondary | ICD-10-CM

## 2023-12-27 DIAGNOSIS — J4489 Other specified chronic obstructive pulmonary disease: Secondary | ICD-10-CM | POA: Diagnosis present

## 2023-12-27 DIAGNOSIS — K648 Other hemorrhoids: Secondary | ICD-10-CM | POA: Diagnosis present

## 2023-12-27 DIAGNOSIS — E119 Type 2 diabetes mellitus without complications: Secondary | ICD-10-CM | POA: Diagnosis present

## 2023-12-27 DIAGNOSIS — Z86018 Personal history of other benign neoplasm: Secondary | ICD-10-CM

## 2023-12-27 DIAGNOSIS — Z825 Family history of asthma and other chronic lower respiratory diseases: Secondary | ICD-10-CM

## 2023-12-27 DIAGNOSIS — K5900 Constipation, unspecified: Secondary | ICD-10-CM | POA: Diagnosis present

## 2023-12-27 DIAGNOSIS — F32A Depression, unspecified: Secondary | ICD-10-CM | POA: Diagnosis present

## 2023-12-27 DIAGNOSIS — Z8249 Family history of ischemic heart disease and other diseases of the circulatory system: Secondary | ICD-10-CM

## 2023-12-27 DIAGNOSIS — Z87891 Personal history of nicotine dependence: Secondary | ICD-10-CM

## 2023-12-27 DIAGNOSIS — Z833 Family history of diabetes mellitus: Secondary | ICD-10-CM

## 2023-12-27 DIAGNOSIS — Z9049 Acquired absence of other specified parts of digestive tract: Secondary | ICD-10-CM

## 2023-12-27 DIAGNOSIS — Z7951 Long term (current) use of inhaled steroids: Secondary | ICD-10-CM

## 2023-12-27 DIAGNOSIS — K760 Fatty (change of) liver, not elsewhere classified: Secondary | ICD-10-CM | POA: Diagnosis present

## 2023-12-27 DIAGNOSIS — F419 Anxiety disorder, unspecified: Secondary | ICD-10-CM | POA: Diagnosis present

## 2023-12-27 DIAGNOSIS — C187 Malignant neoplasm of sigmoid colon: Principal | ICD-10-CM | POA: Diagnosis present

## 2023-12-27 DIAGNOSIS — Z886 Allergy status to analgesic agent status: Secondary | ICD-10-CM

## 2023-12-27 HISTORY — PX: FLEXIBLE SIGMOIDOSCOPY: SHX5431

## 2023-12-27 HISTORY — PX: XI ROBOTIC ASSISTED LOWER ANTERIOR RESECTION: SHX6558

## 2023-12-27 LAB — TYPE AND SCREEN
ABO/RH(D): O POS
Antibody Screen: NEGATIVE

## 2023-12-27 LAB — GLUCOSE, CAPILLARY
Glucose-Capillary: 181 mg/dL — ABNORMAL HIGH (ref 70–99)
Glucose-Capillary: 280 mg/dL — ABNORMAL HIGH (ref 70–99)
Glucose-Capillary: 298 mg/dL — ABNORMAL HIGH (ref 70–99)

## 2023-12-27 LAB — ABO/RH: ABO/RH(D): O POS

## 2023-12-27 SURGERY — RESECTION, RECTUM, LOW ANTERIOR, ROBOT-ASSISTED
Anesthesia: General

## 2023-12-27 MED ORDER — DIPHENHYDRAMINE HCL 12.5 MG/5ML PO ELIX: 12.5000 mg | ORAL_SOLUTION | Freq: Four times a day (QID) | ORAL | Status: DC | PRN

## 2023-12-27 MED ORDER — ROCURONIUM BROMIDE 10 MG/ML (PF) SYRINGE
PREFILLED_SYRINGE | INTRAVENOUS | Status: DC | PRN
  Administered 2023-12-27: 20 mg via INTRAVENOUS
  Administered 2023-12-27: 50 mg via INTRAVENOUS
  Administered 2023-12-27: 20 mg via INTRAVENOUS

## 2023-12-27 MED ORDER — CHLORHEXIDINE GLUCONATE 0.12 % MT SOLN
15.0000 mL | Freq: Once | OROMUCOSAL | Status: AC
  Administered 2023-12-27: 15 mL via OROMUCOSAL

## 2023-12-27 MED ORDER — INSULIN ASPART 100 UNIT/ML IJ SOLN
INTRAMUSCULAR | Status: AC
  Filled 2023-12-27: qty 4

## 2023-12-27 MED ORDER — ALBUTEROL SULFATE (2.5 MG/3ML) 0.083% IN NEBU: 2.5000 mg | INHALATION_SOLUTION | Freq: Four times a day (QID) | RESPIRATORY_TRACT | Status: DC | PRN

## 2023-12-27 MED ORDER — BUPIVACAINE-EPINEPHRINE (PF) 0.25% -1:200000 IJ SOLN
INTRAMUSCULAR | Status: DC | PRN
  Administered 2023-12-27: 60 mL

## 2023-12-27 MED ORDER — KETAMINE HCL 50 MG/5ML IJ SOSY
PREFILLED_SYRINGE | INTRAMUSCULAR | Status: DC | PRN
  Administered 2023-12-27: 20 mg via INTRAVENOUS
  Administered 2023-12-27: 10 mg via INTRAVENOUS

## 2023-12-27 MED ORDER — GLIPIZIDE 5 MG PO TABS
2.5000 mg | ORAL_TABLET | Freq: Every day | ORAL | Status: DC
  Administered 2023-12-28 – 2023-12-29 (×2): 2.5 mg via ORAL
  Filled 2023-12-27 (×2): qty 0.5

## 2023-12-27 MED ORDER — ONDANSETRON HCL 4 MG/2ML IJ SOLN: 4.0000 mg | Freq: Four times a day (QID) | INTRAMUSCULAR | Status: DC | PRN

## 2023-12-27 MED ORDER — SCOPOLAMINE 1 MG/3DAYS TD PT72
MEDICATED_PATCH | TRANSDERMAL | Status: AC
  Filled 2023-12-27: qty 1

## 2023-12-27 MED ORDER — LIDOCAINE HCL (PF) 2 % IJ SOLN
INTRAMUSCULAR | Status: AC
  Filled 2023-12-27: qty 5

## 2023-12-27 MED ORDER — BUPIVACAINE-EPINEPHRINE (PF) 0.25% -1:200000 IJ SOLN
INTRAMUSCULAR | Status: AC
  Filled 2023-12-27: qty 60

## 2023-12-27 MED ORDER — INSULIN ASPART 100 UNIT/ML IJ SOLN
0.0000 [IU] | INTRAMUSCULAR | Status: DC | PRN
  Administered 2023-12-27: 4 [IU] via SUBCUTANEOUS

## 2023-12-27 MED ORDER — LACTATED RINGERS IV SOLN: INTRAVENOUS | Status: DC

## 2023-12-27 MED ORDER — SUGAMMADEX SODIUM 200 MG/2ML IV SOLN
INTRAVENOUS | Status: DC | PRN
  Administered 2023-12-27: 200 mg via INTRAVENOUS

## 2023-12-27 MED ORDER — LABETALOL HCL 5 MG/ML IV SOLN
INTRAVENOUS | Status: AC
  Filled 2023-12-27: qty 4

## 2023-12-27 MED ORDER — OXYCODONE HCL 5 MG PO TABS: 5.0000 mg | ORAL_TABLET | Freq: Once | ORAL | Status: DC | PRN

## 2023-12-27 MED ORDER — LIDOCAINE HCL (PF) 2 % IJ SOLN
INTRAMUSCULAR | Status: DC | PRN
  Administered 2023-12-27: 60 mg via INTRADERMAL
  Administered 2023-12-27: 1.5 mg/kg/h via INTRADERMAL

## 2023-12-27 MED ORDER — METFORMIN HCL 500 MG PO TABS
500.0000 mg | ORAL_TABLET | Freq: Every day | ORAL | Status: DC
  Administered 2023-12-28 – 2023-12-29 (×2): 500 mg via ORAL
  Filled 2023-12-27 (×2): qty 1

## 2023-12-27 MED ORDER — ALUM & MAG HYDROXIDE-SIMETH 200-200-20 MG/5ML PO SUSP: 30.0000 mL | Freq: Four times a day (QID) | ORAL | Status: DC | PRN

## 2023-12-27 MED ORDER — HYDRALAZINE HCL 20 MG/ML IJ SOLN: 10.0000 mg | INTRAMUSCULAR | Status: DC | PRN

## 2023-12-27 MED ORDER — ENSURE PRE-SURGERY PO LIQD: 296.0000 mL | Freq: Once | ORAL | Status: DC

## 2023-12-27 MED ORDER — CHLORHEXIDINE GLUCONATE CLOTH 2 % EX PADS: 6.0000 | MEDICATED_PAD | Freq: Once | CUTANEOUS | Status: DC

## 2023-12-27 MED ORDER — ENSURE SURGERY PO LIQD
237.0000 mL | Freq: Two times a day (BID) | ORAL | Status: DC
  Administered 2023-12-28 – 2023-12-29 (×3): 237 mL via ORAL

## 2023-12-27 MED ORDER — EPHEDRINE 5 MG/ML INJ
INTRAVENOUS | Status: AC
  Filled 2023-12-27: qty 5

## 2023-12-27 MED ORDER — TRAMADOL HCL 50 MG PO TABS
50.0000 mg | ORAL_TABLET | Freq: Four times a day (QID) | ORAL | Status: DC | PRN
  Administered 2023-12-27 – 2023-12-28 (×3): 50 mg via ORAL
  Filled 2023-12-27 (×4): qty 1

## 2023-12-27 MED ORDER — OXYCODONE HCL 5 MG/5ML PO SOLN: 5.0000 mg | Freq: Once | ORAL | Status: DC | PRN

## 2023-12-27 MED ORDER — HYDROMORPHONE HCL 2 MG/ML IJ SOLN
INTRAMUSCULAR | Status: AC
  Filled 2023-12-27: qty 1

## 2023-12-27 MED ORDER — LABETALOL HCL 5 MG/ML IV SOLN
INTRAVENOUS | Status: DC | PRN
  Administered 2023-12-27: 2.5 mg via INTRAVENOUS

## 2023-12-27 MED ORDER — ENSURE PRE-SURGERY PO LIQD: 592.0000 mL | Freq: Once | ORAL | Status: DC

## 2023-12-27 MED ORDER — DROPERIDOL 2.5 MG/ML IJ SOLN: 0.6250 mg | Freq: Once | INTRAMUSCULAR | Status: DC | PRN

## 2023-12-27 MED ORDER — FENTANYL CITRATE (PF) 50 MCG/ML IJ SOSY
25.0000 ug | PREFILLED_SYRINGE | INTRAMUSCULAR | Status: DC | PRN
  Administered 2023-12-27 (×2): 50 ug via INTRAVENOUS

## 2023-12-27 MED ORDER — INSULIN ASPART 100 UNIT/ML IJ SOLN
0.0000 [IU] | Freq: Three times a day (TID) | INTRAMUSCULAR | Status: DC
  Administered 2023-12-28: 2 [IU] via SUBCUTANEOUS
  Administered 2023-12-28 (×2): 3 [IU] via SUBCUTANEOUS
  Administered 2023-12-29: 2 [IU] via SUBCUTANEOUS
  Filled 2023-12-27: qty 3
  Filled 2023-12-27 (×2): qty 2
  Filled 2023-12-27: qty 3

## 2023-12-27 MED ORDER — ALVIMOPAN 12 MG PO CAPS
12.0000 mg | ORAL_CAPSULE | ORAL | Status: AC
  Administered 2023-12-27: 12 mg via ORAL
  Filled 2023-12-27: qty 1

## 2023-12-27 MED ORDER — HEPARIN SODIUM (PORCINE) 5000 UNIT/ML IJ SOLN
5000.0000 [IU] | Freq: Three times a day (TID) | INTRAMUSCULAR | Status: DC
  Administered 2023-12-27 – 2023-12-29 (×5): 5000 [IU] via SUBCUTANEOUS
  Filled 2023-12-27 (×5): qty 1

## 2023-12-27 MED ORDER — LACTATED RINGERS IR SOLN
Status: DC | PRN
  Administered 2023-12-27: 1000 mL

## 2023-12-27 MED ORDER — INSULIN ASPART 100 UNIT/ML IJ SOLN
0.0000 [IU] | Freq: Every day | INTRAMUSCULAR | Status: DC
  Administered 2023-12-27: 3 [IU] via SUBCUTANEOUS
  Filled 2023-12-27: qty 3

## 2023-12-27 MED ORDER — GLIPIZIDE-METFORMIN HCL 2.5-500 MG PO TABS: 1.0000 | ORAL_TABLET | Freq: Every day | ORAL | Status: DC

## 2023-12-27 MED ORDER — LABETALOL HCL 5 MG/ML IV SOLN
10.0000 mg | Freq: Once | INTRAVENOUS | Status: AC
  Administered 2023-12-27: 10 mg via INTRAVENOUS

## 2023-12-27 MED ORDER — 0.9 % SODIUM CHLORIDE (POUR BTL) OPTIME
TOPICAL | Status: DC | PRN
  Administered 2023-12-27: 1000 mL

## 2023-12-27 MED ORDER — ONDANSETRON HCL 4 MG PO TABS: 4.0000 mg | ORAL_TABLET | Freq: Four times a day (QID) | ORAL | Status: DC | PRN

## 2023-12-27 MED ORDER — INSULIN ASPART 100 UNIT/ML IJ SOLN
8.0000 [IU] | Freq: Once | INTRAMUSCULAR | Status: AC
  Administered 2023-12-27: 8 [IU] via SUBCUTANEOUS

## 2023-12-27 MED ORDER — FENTANYL CITRATE (PF) 50 MCG/ML IJ SOSY
PREFILLED_SYRINGE | INTRAMUSCULAR | Status: AC
  Filled 2023-12-27: qty 1

## 2023-12-27 MED ORDER — FENTANYL CITRATE (PF) 100 MCG/2ML IJ SOLN
INTRAMUSCULAR | Status: DC | PRN
  Administered 2023-12-27: 100 ug via INTRAVENOUS
  Administered 2023-12-27 (×3): 50 ug via INTRAVENOUS

## 2023-12-27 MED ORDER — FENTANYL CITRATE (PF) 100 MCG/2ML IJ SOLN
INTRAMUSCULAR | Status: AC
  Filled 2023-12-27: qty 2

## 2023-12-27 MED ORDER — INSULIN ASPART 100 UNIT/ML IJ SOLN
INTRAMUSCULAR | Status: AC
  Filled 2023-12-27: qty 8

## 2023-12-27 MED ORDER — HEPARIN SODIUM (PORCINE) 5000 UNIT/ML IJ SOLN
5000.0000 [IU] | Freq: Once | INTRAMUSCULAR | Status: AC
  Administered 2023-12-27: 5000 [IU] via SUBCUTANEOUS
  Filled 2023-12-27: qty 1

## 2023-12-27 MED ORDER — ROCURONIUM BROMIDE 10 MG/ML (PF) SYRINGE
PREFILLED_SYRINGE | INTRAVENOUS | Status: AC
  Filled 2023-12-27: qty 10

## 2023-12-27 MED ORDER — SCOPOLAMINE 1 MG/3DAYS TD PT72
MEDICATED_PATCH | TRANSDERMAL | Status: DC | PRN
  Administered 2023-12-27: 1 via TRANSDERMAL

## 2023-12-27 MED ORDER — DEXAMETHASONE SOD PHOSPHATE PF 10 MG/ML IJ SOLN
INTRAMUSCULAR | Status: DC | PRN
  Administered 2023-12-27: 8 mg via INTRAVENOUS

## 2023-12-27 MED ORDER — EPHEDRINE SULFATE-NACL 50-0.9 MG/10ML-% IV SOSY
PREFILLED_SYRINGE | INTRAVENOUS | Status: DC | PRN
  Administered 2023-12-27: 5 mg via INTRAVENOUS

## 2023-12-27 MED ORDER — BISACODYL 5 MG PO TBEC: 20.0000 mg | DELAYED_RELEASE_TABLET | Freq: Once | ORAL | Status: DC

## 2023-12-27 MED ORDER — HYDRALAZINE HCL 20 MG/ML IJ SOLN
10.0000 mg | Freq: Once | INTRAMUSCULAR | Status: AC
  Administered 2023-12-27: 10 mg via INTRAVENOUS

## 2023-12-27 MED ORDER — PROPOFOL 10 MG/ML IV BOLUS
INTRAVENOUS | Status: DC | PRN
  Administered 2023-12-27: 10 mg via INTRAVENOUS

## 2023-12-27 MED ORDER — KETAMINE HCL 50 MG/5ML IJ SOSY
PREFILLED_SYRINGE | INTRAMUSCULAR | Status: AC
  Filled 2023-12-27: qty 5

## 2023-12-27 MED ORDER — DIPHENHYDRAMINE HCL 50 MG/ML IJ SOLN: 12.5000 mg | Freq: Four times a day (QID) | INTRAMUSCULAR | Status: DC | PRN

## 2023-12-27 MED ORDER — TRAMADOL HCL 50 MG PO TABS: 50.0000 mg | ORAL_TABLET | Freq: Four times a day (QID) | ORAL | 0 refills | Status: AC | PRN

## 2023-12-27 MED ORDER — ACETAMINOPHEN 500 MG PO TABS
1000.0000 mg | ORAL_TABLET | Freq: Four times a day (QID) | ORAL | Status: DC
  Administered 2023-12-28 – 2023-12-29 (×5): 1000 mg via ORAL
  Filled 2023-12-27 (×6): qty 2

## 2023-12-27 MED ORDER — MIDAZOLAM HCL 5 MG/5ML IJ SOLN
INTRAMUSCULAR | Status: DC | PRN
  Administered 2023-12-27: 2 mg via INTRAVENOUS

## 2023-12-27 MED ORDER — HYDROMORPHONE HCL 1 MG/ML IJ SOLN
INTRAMUSCULAR | Status: DC | PRN
  Administered 2023-12-27 (×2): .5 mg via INTRAVENOUS

## 2023-12-27 MED ORDER — ALVIMOPAN 12 MG PO CAPS
12.0000 mg | ORAL_CAPSULE | Freq: Two times a day (BID) | ORAL | Status: DC
  Administered 2023-12-28: 12 mg via ORAL
  Filled 2023-12-27: qty 1

## 2023-12-27 MED ORDER — METRONIDAZOLE 500 MG PO TABS: 1000.0000 mg | ORAL_TABLET | ORAL | Status: DC

## 2023-12-27 MED ORDER — ONDANSETRON HCL 4 MG/2ML IJ SOLN
INTRAMUSCULAR | Status: AC
  Filled 2023-12-27: qty 2

## 2023-12-27 MED ORDER — LABETALOL HCL 5 MG/ML IV SOLN: 10.0000 mg | INTRAVENOUS | Status: DC | PRN

## 2023-12-27 MED ORDER — ALPRAZOLAM 0.25 MG PO TABS
0.2500 mg | ORAL_TABLET | Freq: Three times a day (TID) | ORAL | Status: DC | PRN
  Administered 2023-12-28 (×2): 0.25 mg via ORAL
  Filled 2023-12-27 (×3): qty 1

## 2023-12-27 MED ORDER — POLYETHYLENE GLYCOL 3350 17 GM/SCOOP PO POWD: 238.0000 g | Freq: Once | ORAL | Status: DC

## 2023-12-27 MED ORDER — ONDANSETRON HCL 4 MG/2ML IJ SOLN
INTRAMUSCULAR | Status: DC | PRN
  Administered 2023-12-27: 4 mg via INTRAVENOUS

## 2023-12-27 MED ORDER — ORAL CARE MOUTH RINSE: 15.0000 mL | Freq: Once | OROMUCOSAL | Status: AC

## 2023-12-27 MED ORDER — NEOMYCIN SULFATE 500 MG PO TABS: 1000.0000 mg | ORAL_TABLET | ORAL | Status: DC

## 2023-12-27 MED ORDER — PROPOFOL 10 MG/ML IV BOLUS
INTRAVENOUS | Status: AC
  Filled 2023-12-27: qty 20

## 2023-12-27 MED ORDER — HYDROMORPHONE HCL 1 MG/ML IJ SOLN
0.5000 mg | INTRAMUSCULAR | Status: DC | PRN
  Administered 2023-12-29: 0.5 mg via INTRAVENOUS
  Filled 2023-12-27: qty 0.5

## 2023-12-27 MED ORDER — MIDAZOLAM HCL 2 MG/2ML IJ SOLN
INTRAMUSCULAR | Status: AC
  Filled 2023-12-27: qty 2

## 2023-12-27 MED ORDER — SODIUM CHLORIDE (PF) 0.9 % IJ SOLN
INTRAMUSCULAR | Status: AC
  Filled 2023-12-27: qty 20

## 2023-12-27 MED ORDER — SIMETHICONE 80 MG PO CHEW: 40.0000 mg | CHEWABLE_TABLET | Freq: Four times a day (QID) | ORAL | Status: DC | PRN

## 2023-12-27 MED ORDER — SUGAMMADEX SODIUM 200 MG/2ML IV SOLN
INTRAVENOUS | Status: AC
  Filled 2023-12-27: qty 2

## 2023-12-27 MED ORDER — SODIUM CHLORIDE 0.9 % IV SOLN
2.0000 g | INTRAVENOUS | Status: AC
  Administered 2023-12-27: 2 g via INTRAVENOUS
  Filled 2023-12-27: qty 2

## 2023-12-27 MED ORDER — ACETAMINOPHEN 500 MG PO TABS
1000.0000 mg | ORAL_TABLET | ORAL | Status: AC
  Administered 2023-12-27: 1000 mg via ORAL
  Filled 2023-12-27: qty 2

## 2023-12-27 MED ORDER — HYDRALAZINE HCL 20 MG/ML IJ SOLN
INTRAMUSCULAR | Status: AC
  Filled 2023-12-27: qty 1

## 2023-12-27 SURGICAL SUPPLY — 82 items
BAG COUNTER SPONGE SURGICOUNT (BAG) IMPLANT
BLADE EXTENDED COATED 6.5IN (ELECTRODE) ×1 IMPLANT
CANNULA REDUCER 12-8 DVNC XI (CANNULA) ×1 IMPLANT
CHLORAPREP W/TINT 26 (MISCELLANEOUS) ×1 IMPLANT
CLIP APPLIE 5 13 M/L LIGAMAX5 (MISCELLANEOUS) IMPLANT
CLIP APPLIE ROT 10 11.4 M/L (STAPLE) IMPLANT
CLIP LIGATING HEM O LOK PURPLE (MISCELLANEOUS) IMPLANT
CLIP LIGATING HEMO O LOK GREEN (MISCELLANEOUS) IMPLANT
COVER SURGICAL LIGHT HANDLE (MISCELLANEOUS) ×2 IMPLANT
COVER TIP SHEARS 8 DVNC (MISCELLANEOUS) ×1 IMPLANT
DEFOGGER SCOPE WARM SEASHARP (MISCELLANEOUS) ×2 IMPLANT
DERMABOND ADVANCED .7 DNX12 (GAUZE/BANDAGES/DRESSINGS) IMPLANT
DEVICE TROCAR PUNCTURE CLOSURE (ENDOMECHANICALS) IMPLANT
DRAIN CHANNEL 19F RND (DRAIN) ×1 IMPLANT
DRAPE ARM DVNC X/XI (DISPOSABLE) ×4 IMPLANT
DRAPE COLUMN DVNC XI (DISPOSABLE) ×1 IMPLANT
DRAPE CV SPLIT W-CLR ANES SCRN (DRAPES) ×1 IMPLANT
DRAPE PERI GROIN 82X75IN TIB (DRAPES) ×1 IMPLANT
DRAPE SURG IRRIG POUCH 19X23 (DRAPES) ×1 IMPLANT
DRIVER NDL LRG 8 DVNC XI (INSTRUMENTS) ×1 IMPLANT
DRSG OPSITE POSTOP 4X10 (GAUZE/BANDAGES/DRESSINGS) IMPLANT
DRSG OPSITE POSTOP 4X6 (GAUZE/BANDAGES/DRESSINGS) IMPLANT
DRSG OPSITE POSTOP 4X8 (GAUZE/BANDAGES/DRESSINGS) IMPLANT
DRSG TEGADERM 2-3/8X2-3/4 SM (GAUZE/BANDAGES/DRESSINGS) ×5 IMPLANT
DRSG TEGADERM 4X4.75 (GAUZE/BANDAGES/DRESSINGS) ×1 IMPLANT
ELECT REM PT RETURN 15FT ADLT (MISCELLANEOUS) ×1 IMPLANT
EVACUATOR SILICONE 100CC (DRAIN) ×1 IMPLANT
GAUZE SPONGE 2X2 8PLY STRL LF (GAUZE/BANDAGES/DRESSINGS) ×1 IMPLANT
GAUZE SPONGE 4X4 12PLY STRL (GAUZE/BANDAGES/DRESSINGS) IMPLANT
GLOVE BIO SURGEON STRL SZ7.5 (GLOVE) ×3 IMPLANT
GLOVE INDICATOR 8.0 STRL GRN (GLOVE) ×3 IMPLANT
GOWN SRG XL LVL 4 BRTHBL STRL (GOWNS) ×1 IMPLANT
GOWN STRL REUS W/ TWL XL LVL3 (GOWN DISPOSABLE) ×5 IMPLANT
GRASPER SUT TROCAR 14GX15 (MISCELLANEOUS) IMPLANT
GRASPER TIP-UP FEN DVNC XI (INSTRUMENTS) ×1 IMPLANT
HOLDER FOLEY CATH W/STRAP (MISCELLANEOUS) ×1 IMPLANT
IRRIGATION SUCT STRKRFLW 2 WTP (MISCELLANEOUS) ×1 IMPLANT
KIT PROCEDURE DVNC SI (MISCELLANEOUS) IMPLANT
KIT TURNOVER KIT A (KITS) ×1 IMPLANT
NDL INSUFFLATION 14GA 120MM (NEEDLE) ×1 IMPLANT
PACK COLON (CUSTOM PROCEDURE TRAY) ×1 IMPLANT
PAD POSITIONING PINK XL (MISCELLANEOUS) ×1 IMPLANT
PENCIL SMOKE EVACUATOR (MISCELLANEOUS) IMPLANT
PROTECTOR NERVE ULNAR (MISCELLANEOUS) ×2 IMPLANT
RELOAD STAPLE 60 2.5 WHT DVNC (STAPLE) IMPLANT
RELOAD STAPLE 60 3.5 BLU DVNC (STAPLE) IMPLANT
RELOAD STAPLE 60 4.3 GRN DVNC (STAPLE) IMPLANT
RETRACTOR WND ALEXIS 18 MED (MISCELLANEOUS) IMPLANT
SCISSORS LAP 5X35 DISP (ENDOMECHANICALS) IMPLANT
SCISSORS MNPLR CVD DVNC XI (INSTRUMENTS) ×1 IMPLANT
SEAL UNIV 5-12 XI (MISCELLANEOUS) ×4 IMPLANT
SEALER VESSEL EXT DVNC XI (MISCELLANEOUS) ×1 IMPLANT
SLEEVE ADV FIXATION 5X100MM (TROCAR) IMPLANT
SOLUTION ELECTROSURG ANTI STCK (MISCELLANEOUS) ×1 IMPLANT
SPIKE FLUID TRANSFER (MISCELLANEOUS) ×1 IMPLANT
STAPLER 60 SUREFORM DVNC (STAPLE) IMPLANT
STAPLER ECHELON POWER CIR 29 (STAPLE) IMPLANT
STAPLER ECHELON POWER CIR 31 (STAPLE) IMPLANT
STOPCOCK 4 WAY LG BORE MALE ST (IV SETS) ×2 IMPLANT
SURGILUBE 2OZ TUBE FLIPTOP (MISCELLANEOUS) ×1 IMPLANT
SUT MNCRL AB 4-0 PS2 18 (SUTURE) ×1 IMPLANT
SUT PDS AB 1 CT1 27 (SUTURE) IMPLANT
SUT PDS AB 1 TP1 96 (SUTURE) IMPLANT
SUT PROLENE 0 CT 2 (SUTURE) IMPLANT
SUT PROLENE 2 0 KS (SUTURE) ×1 IMPLANT
SUT PROLENE 2 0 SH DA (SUTURE) IMPLANT
SUT SILK 2 0 SH CR/8 (SUTURE) IMPLANT
SUT SILK 2-0 18XBRD TIE 12 (SUTURE) IMPLANT
SUT SILK 3 0 SH CR/8 (SUTURE) ×1 IMPLANT
SUT SILK 3-0 18XBRD TIE 12 (SUTURE) ×1 IMPLANT
SUT VIC AB 3-0 SH 18 (SUTURE) IMPLANT
SUT VIC AB 3-0 SH 27XBRD (SUTURE) IMPLANT
SUT VICRYL 0 UR6 27IN ABS (SUTURE) ×1 IMPLANT
SUTURE V-LC BRB 180 2/0GR6GS22 (SUTURE) IMPLANT
SYR 10ML LL (SYRINGE) ×1 IMPLANT
SYSTEM LAPSCP GELPORT 120MM (MISCELLANEOUS) IMPLANT
SYSTEM WOUND ALEXIS 18CM MED (MISCELLANEOUS) ×1 IMPLANT
TAPE UMBILICAL 1/8 X36 TWILL (MISCELLANEOUS) ×1 IMPLANT
TRAY FOLEY MTR SLVR 16FR STAT (SET/KITS/TRAYS/PACK) ×1 IMPLANT
TROCAR ADV FIXATION 5X100MM (TROCAR) ×1 IMPLANT
TUBING CONNECTING 10 (TUBING) ×3 IMPLANT
TUBING INSUFFLATION 10FT LAP (TUBING) ×1 IMPLANT

## 2023-12-27 NOTE — Anesthesia Procedure Notes (Signed)
 Procedure Name: Intubation Date/Time: 12/27/2023 11:54 AM  Performed by: Memory Armida LABOR, CRNAPre-anesthesia Checklist: Patient identified, Emergency Drugs available, Patient being monitored, Suction available and Timeout performed Patient Re-evaluated:Patient Re-evaluated prior to induction Oxygen Delivery Method: Circle system utilized Preoxygenation: Pre-oxygenation with 100% oxygen Induction Type: IV induction Ventilation: Mask ventilation without difficulty Laryngoscope Size: Mac and 4 Grade View: Grade I Tube type: Oral Tube size: 7.5 mm Number of attempts: 1 Airway Equipment and Method: Stylet Placement Confirmation: ETT inserted through vocal cords under direct vision, positive ETCO2 and breath sounds checked- equal and bilateral Secured at: 22 cm Tube secured with: Tape Dental Injury: Teeth and Oropharynx as per pre-operative assessment

## 2023-12-27 NOTE — Transfer of Care (Signed)
 Immediate Anesthesia Transfer of Care Note  Patient: Terry Mckinney  Procedure(s) Performed: RESECTION, RECTUM, LOW ANTERIOR, ROBOT-ASSISTED; LYSIS OF ADHESIONS SIGMOIDOSCOPY, FLEXIBLE  Patient Location: PACU  Anesthesia Type:General  Level of Consciousness: drowsy  Airway & Oxygen Therapy: Patient Spontanous Breathing and Patient connected to face mask oxygen  Post-op Assessment: Report given to RN and Post -op Vital signs reviewed and stable  Post vital signs: Reviewed and stable  Last Vitals:  Vitals Value Taken Time  BP 172/114 12/27/23 15:18  Temp    Pulse 80 12/27/23 15:20  Resp 13 12/27/23 15:20  SpO2 95 % 12/27/23 15:20  Vitals shown include unfiled device data.  Last Pain:  Vitals:   12/27/23 1055  PainSc: 0-No pain         Complications: No notable events documented.

## 2023-12-27 NOTE — Anesthesia Postprocedure Evaluation (Signed)
 Anesthesia Post Note  Patient: Terry Mckinney  Procedure(s) Performed: RESECTION, RECTUM, LOW ANTERIOR, ROBOT-ASSISTED; LYSIS OF ADHESIONS SIGMOIDOSCOPY, FLEXIBLE     Patient location during evaluation: PACU Anesthesia Type: General Level of consciousness: awake and alert Pain management: pain level controlled Vital Signs Assessment: post-procedure vital signs reviewed and stable Respiratory status: spontaneous breathing, nonlabored ventilation, respiratory function stable and patient connected to nasal cannula oxygen Cardiovascular status: blood pressure returned to baseline and stable Postop Assessment: no apparent nausea or vomiting Anesthetic complications: no   No notable events documented.  Last Vitals:  Vitals:   12/27/23 1745 12/27/23 1800  BP: (!) 146/97 139/89  Pulse: 82 82  Resp: 11 12  Temp: (!) 36.1 C   SpO2: 95% 95%    Last Pain:  Vitals:   12/27/23 1800  PainSc: 0-No pain                 Epifanio Lamar BRAVO
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
## 2023-12-27 NOTE — Discharge Instructions (Signed)

## 2023-12-27 NOTE — Op Note (Signed)
 PATIENT: Terry Mckinney  64 y.o. male  Patient Care Team: Durel Riggs, FNP as PCP - General (Nurse Practitioner) Ardis Evalene CROME, RN as Oncology Nurse Navigator  PREOP DIAGNOSIS: SIGMOID COLON CANCER  POSTOP DIAGNOSIS: SIGMOID COLON CANCER  PROCEDURE:  Robotic assisted low anterior resection with double stapled coloproctostomy Robotic lysis of adhesions x 60 minutes Intraoperative assessment of perfusion using ICG fluorescence imaging Flexible sigmoidoscopy Bilateral transversus abdominus plane (TAP) blocks  SURGEON: Lonni HERO. Teresa, MD  ASSISTANT: Elspeth Schultze, MD  An experienced assistant was required given the complexity of this procedure and the standard of surgical care. My assistant helped with exposure through counter tension, suctioning, ligation and retraction to better visualize the surgical field. My assistant expedited sewing during the case by following my sutures. Wherever I use the term we in the report, my assistant actively helped me with that portion of the procedure.   ANESTHESIA: General endotracheal  EBL: 50 mL Total I/O In: 100 [IV Piggyback:100] Out: 250 [Urine:200; Blood:50]  DRAINS: none  SPECIMEN:  Rectosigmoid colon - open end proximal Proximal anastomotic donut  COUNTS: Sponge, needle and instrument counts were reported correct x2  FINDINGS: No evidence of metastatic disease on visceral parietal peritoneum or liver. Visible mass involving proximal sigmoid colon. Low anterior resction to proximal rectum was necessary to ensure perfusion of rectum in setting of high IMA ligation. A well perfused, tension free, hemostatic, air tight 15 mm EEA colorectal anastomosis fashioned 15 cm from the anal verge by flexible sigmoidoscopy.   NARRATIVE: The patient was seen in the pre-op holding area. The risks, benefits, complications, treatment options, and expected outcomes were previously discussed with the patient. The patient agreed with the  proposed plan and has signed the informed consent form. The patient was brought to the operating room by the surgical team, identified as Terry Mckinney, and the procedure verified. placed supine on the operating table and SCD's were applied. General endotracheal anesthesia was induced without difficulty. He was then positioned in the lithotomy position with Allen stirrups.  Pressure points were evaluated and padded.  A foley catheter was then placed by nursing under sterile conditions. Hair on the abdomen was clipped.  He was secured to the operating table.  The abdomen was then prepped and draped in the standard sterile fashion. A time out was completed and the above information confirmed and need for preoperative antibiotics.   An OG tube was placed by anesthesia and confirmed to be to suction.  At Palmer's point, a stab incision was created and the Veress needle was introduced into the peritoneal cavity on the first attempt.  Intraperitoneal location was confirmed by the aspiration and saline drop test.  Pneumoperitoneum was established to a maximum pressure of 15 mmHg using CO2.  Following this, the abdomen was marked for planned trocar sites.  Just to the right and cephalad to the umbilicus, an 8 mm incision was created and an 8 mm blunt tipped robotic trocar was cautiously placed into the peritoneal cavity.  The laparoscope was inserted and demonstrated no evidence of trocar site nor Veress needle site complications.  The Veress needle was removed.  Adhesions are noted across the upper abdomen consisting of omentum from prior surgery.  These are primarily between the omentum and abdominal wall.  Bilateral transversus abdominis plane blocks were then created using Marcaine  with epinephrine .  3 additional 8 mm robotic trochars were placed under direct visualization roughly in a line extending from the right ASIS towards the left upper  quadrant. The bladder was inspected and noted to be at/below the pubic  symphysis.  Staying 3 fingerbreadths above the pubic symphysis, an incision was created and the 12 mm robotic trocar inserted directed cephalad into the peritoneal cavity under direct visualization.  An additional 5 mm assist port was placed in the right lateral abdomen under direct visualization.  The abdomen was surveyed and there was notable mass in the proximal sigmoid colon with a tattoo flanking it.  Peritoneal surfaces are normal.  Surfaces of the small bowel are normal.  Complete visualization of the liver is not completely possible due to the degree of adhesions between the anterior portion of his liver and his abdominal wall and diaphragm from his prior traumatic injury.  He was positioned in Trendelenburg with the left side tilted slightly up.  Small bowel was carefully retracted out of the pelvis.  The robot was then docked and I went to the console.  We began with adhesiolysis. Adhesions consisting of omentum were carefully taken down using the vessel sealer and sharp dissection.  There are also adhesions between appendices epiploica and his sigmoid colon that were also lysed similarly.  All adhesiolysis was necessary to facilitate the procedure.  Omentum was then inspected and verified to be hemostatic.  Total time with adhesiolysis is 60 minutes.   The sigmoid colon was readily identified.  There is again an evident mass in the proximal portion of the sigmoid.  There is no evidence of full-thickness extension or involvement of any surrounding structures.  We did begin however by mobilizing the sigmoid colon.  Attachments of the sigmoid colon were taken down from the intersigmoid fossa.  The rectosigmoid colon was grasped and elevated anteriorly.  Beginning with a medial to lateral approach, the peritoneum overlying the presacral space was carefully incised.  The TME plane was readily gained working in a plane between the fascia propria of the rectum and the presacral fascia.  Hypogastric nerves  were seen going along the the presacral fascia and were protected free of injury.  Working more proximally, the mesorectum and sigmoid mesentery were carefully mobilized off of the peritoneum.  The left ureter was identified and protected free of injury.  The left gonadal vessels were identified and protected.  These were both swept down.  The superior hemorrhoidal and IMA pedicles were identified. Further mesocolon was mobilized proximally staying in this plane between the retroperitoneum proper and the mesocolon. Attention was then turned to the lateral portion of dissection.  The sigmoid colon was then retracted to the right.  The sigmoid colon was fully mobilized. The descending colon was mobilized by incising the Rubena Roseman line of Toldt.  This was done all the way up to the level of the splenic flexure.  The associated mesocolon was also mobilized medially.  The left ureter again was confirmed to be well away from the vasculature which had been dissected medially.  The rectosigmoid colon was elevated anteriorly. The left ureter was re-identified. The IMA was clear of this. The IMA was then divided with a Anjana Cheek load of the robotic stapler near its origin. The stump was inspected and noted to be completely hemostatic with a good seal.  The mesentery was divided out to the point of planned proximal division which is at least 5 cm proximal to the tumor.  The mesentery at this point was also oncologic and its resection type as we ended up taking the left colic artery as it appeared to at least partially supply this  area of the tumor.  Working more distally, the rectum was identified where the tinea had splayed and there were loss of appendices epiploica.  We are concerned given the relatively proximal IMA ligation that there could be concerns for perfusion of his rectosigmoid colon.  For that reason we opted to continue the dissection down to the proximal rectum.  This also corresponded to a location overlying the  sacral promontory.  Anatomically, this clearly represents the proximal rectum.  The mesentery out to this level was then cleared using the vessel sealer. The distal point of transection on the proximal rectum was identified.   A 60 mm green load robotic stapler was then placed through the 12 mm port and introduced into the peritoneal cavity.  The rectum was divided with a single firing of the stapler.  The stump was intact and healthy in appearance.   Attention was turned to performing a perfusion test. ICG was administered by anesthesia and at the level of the cleared mesentery proximally, there was excellent uptake of the tracer.  The rectum was also well perfused in appearance.  There was a visible pulse in the mesentery out to the level of the cleared colon at the level of the distal descending colon.  This colon is also supple and healthy in appearance without any thickening.  It is at least 5 cm proximal to the tumor.  This reached into the pelvis without any difficulty and remained in that location without any tension. A locking grasper was then placed on the sigmoid staple line.   Attention was turned to the extracorporeal portion of the procedure.  The robot was undocked.  I scrubbed back in.  Using the 12 mm trocar site, a Pfannenstiel incision was created and incorporated the fascial opening through the 12 mm port site.  The rectus fascia was incised and then elevated.  The rectus muscle was mobilized free of the overlying fascia.  The peritoneum was incised in the midline well above the location of the bladder.  An Alexis wound protector was placed.  Towels were placed around the field.  The divided colon was passed through the wound protector.  The point of proximal division was identified and was again on a healthy segment of supple colon with a palpable pulse in the mesentery. This was pink in color.  A pursestring device was applied.  A 2-0 Prolene on a Keith needle was passed.  The colon was  divided and passed off with the open end being proximal.  EEA sizers were then introduced and a 29 mm EEA selected.  Belt loops consisting of 3-0 silk were placed around the pursestring suture line.  The anvil was placed and the pursestring tied.  A small amount of fat was cleared from the planned anastomosis and no diverticula were apparent within this.  This was placed back into the abdomen and a cap placed over the wound protector port site.  Pneumoperitoneum was reestablished.  I then went below to pass the stapler.  My partner remained above.  EEA sizers were cautiously introduced via the anus and advanced under direct visualization.  The stapler was passed and the spike deployed just anterior to the staple line.  The components were then mated.  Orientation was confirmed such that there is no twisting of the colon nor small bowel underneath the mesenteric defect. Care was taken to ensure no other structures were incorporated within this either.  The stapler was then closed, held, and fired. This was  then removed. The donuts were inspected and noted to be complete.  Proximal donut was also passed off as additional specimen.  The colon proximal to the anastomosis was then gently occluded. The pelvis was filled with sterile irrigation. Under direct visualization, I passed a flexible sigmoidoscope.  The anastomosis was under water.  With good distention of the anastomosis there was no air leak. The anastomosis pink in appearance.  This is located at 15 cm from the anal verge by flexible sigmoidoscopy.  It is hemostatic.  Additionally, looking from above, there is no tension on the colon or mesentery.  Sigmoidoscope was withdrawn.  Irrigation was evacuated from the pelvis.  The abdomen and pelvis are surveyed and noted to be completely hemostatic without any apparent injury.  Under direct visualization, all trochars are removed.  The Alexis wound protector was removed.  Gowns/gloves are changed and a fresh set of  clean instruments utilized. Additional sterile drapes were placed around the field.   The Pfannenstiel peritoneum was closed with a running 0 Vicryl suture.  The rectus fascia was then closed using 2 running #1 PDS sutures.  The fascia was then palpated and noted to be completely closed.  Additional anesthetic was infiltrated at the Pfannenstiel site.  Sponge, needle, and instrument counts were reported correct x2. 4-0 Monocryl subcuticular suture was used to close the skin of all incision sites.  Dermabond was placed over all incisions.  A honeycomb dressing placed over the Pfannenstiel as well.   He was then taken out of lithotomy, awakened from anesthesia, extubated, and transferred to a stretcher for transport to PACU in satisfactory condition having tolerated the procedure well.

## 2023-12-27 NOTE — Anesthesia Preprocedure Evaluation (Addendum)
 Anesthesia Evaluation  Patient identified by MRN, date of birth, ID band Patient awake    Reviewed: Allergy & Precautions, NPO status , Patient's Chart, lab work & pertinent test results  Airway Mallampati: II  TM Distance: >3 FB Neck ROM: Full    Dental  (+) Dental Advisory Given   Pulmonary shortness of breath, asthma , COPD, former smoker   breath sounds clear to auscultation       Cardiovascular negative cardio ROS  Rhythm:Regular Rate:Normal     Neuro/Psych negative neurological ROS     GI/Hepatic negative GI ROS, Neg liver ROS,,,  Endo/Other  negative endocrine ROS    Renal/GU negative Renal ROS     Musculoskeletal  (+) Arthritis ,    Abdominal   Peds  Hematology negative hematology ROS (+)   Anesthesia Other Findings   Reproductive/Obstetrics                              Anesthesia Physical Anesthesia Plan  ASA: 2  Anesthesia Plan: General   Post-op Pain Management: Lidocaine  infusion*, Tylenol  PO (pre-op)* and Ketamine  IV*   Induction: Intravenous  PONV Risk Score and Plan: 3 and Dexamethasone , Ondansetron , Propofol  infusion, Midazolam , Treatment may vary due to age or medical condition and Scopolamine  patch - Pre-op  Airway Management Planned: Oral ETT  Additional Equipment: None  Intra-op Plan:   Post-operative Plan: Extubation in OR  Informed Consent: I have reviewed the patients History and Physical, chart, labs and discussed the procedure including the risks, benefits and alternatives for the proposed anesthesia with the patient or authorized representative who has indicated his/her understanding and acceptance.     Dental advisory given  Plan Discussed with: CRNA  Anesthesia Plan Comments:         Anesthesia Quick Evaluation

## 2023-12-28 ENCOUNTER — Encounter (HOSPITAL_COMMUNITY): Payer: Self-pay | Admitting: Surgery

## 2023-12-28 LAB — BASIC METABOLIC PANEL WITH GFR
Anion gap: 19 — ABNORMAL HIGH (ref 5–15)
BUN: 9 mg/dL (ref 8–23)
CO2: 21 mmol/L — ABNORMAL LOW (ref 22–32)
Calcium: 8.8 mg/dL — ABNORMAL LOW (ref 8.9–10.3)
Chloride: 98 mmol/L (ref 98–111)
Creatinine, Ser: 1.05 mg/dL (ref 0.61–1.24)
GFR, Estimated: >60 mL/min (ref >60)
Glucose, Bld: 156 mg/dL — ABNORMAL HIGH (ref 70–99)
Potassium: 4.2 mmol/L (ref 3.5–5.1)
Sodium: 138 mmol/L (ref 135–145)

## 2023-12-28 LAB — CBC
HCT: 40.2 % (ref 39.0–52.0)
Hemoglobin: 14.8 g/dL (ref 13.0–17.0)
MCH: 28.5 pg (ref 26.0–34.0)
MCHC: 36.8 g/dL — ABNORMAL HIGH (ref 30.0–36.0)
MCV: 77.5 fL — ABNORMAL LOW (ref 80.0–100.0)
Platelets: 63 K/uL — ABNORMAL LOW (ref 150–400)
RBC: 5.19 MIL/uL (ref 4.22–5.81)
RDW: 13.2 % (ref 11.5–15.5)
WBC: 5.9 K/uL (ref 4.0–10.5)
nRBC: 0 % (ref 0.0–0.2)

## 2023-12-28 LAB — GLUCOSE, CAPILLARY
Glucose-Capillary: 154 mg/dL — ABNORMAL HIGH (ref 70–99)
Glucose-Capillary: 153 mg/dL — ABNORMAL HIGH (ref 70–99)
Glucose-Capillary: 134 mg/dL — ABNORMAL HIGH (ref 70–99)
Glucose-Capillary: 148 mg/dL — ABNORMAL HIGH (ref 70–99)

## 2023-12-28 MED ORDER — LIVING WELL WITH DIABETES BOOK
Freq: Once | Status: AC
  Filled 2023-12-28: qty 1

## 2023-12-28 NOTE — Plan of Care (Signed)
  Problem: Education: Goal: Understanding of discharge needs will improve Outcome: Progressing Goal: Verbalization of understanding of the causes of altered bowel function will improve Outcome: Progressing   Problem: Activity: Goal: Ability to tolerate increased activity will improve Outcome: Progressing

## 2023-12-28 NOTE — Progress Notes (Signed)
 While getting patient up out of bed, patient began to hyperventilate, and express,  I can't breath, I have COPD, and panic attacks. Loosened patients clothing, removed blankets, and coached patient to slow down breathing. Patient given prn medications and assisted back to bed. Plan of care ongoing.

## 2023-12-28 NOTE — Inpatient Diabetes Management (Signed)
 Inpatient Diabetes Program Recommendations  AACE/ADA: New Consensus Statement on Inpatient Glycemic Control (2015)  Target Ranges:  Prepandial:   less than 140 mg/dL      Peak postprandial:   less than 180 mg/dL (1-2 hours)      Critically ill patients:  140 - 180 mg/dL   Lab Results  Component Value Date   GLUCAP 134 (H) 12/28/2023   HGBA1C 8.9 (H) 12/18/2023    Review of Glycemic Control  Diabetes history: DM2 Outpatient Diabetes medications: metaglip 2.5-500 mg daily Current orders for Inpatient glycemic control: Novolog  0-15 TID with meals and 0-5 HS, glipizide  2.5 mg daily, metformin 500 mg with breakfast  HgbA1C - 8.9% CBGs 154, 153, 134  Inpatient Diabetes Program Recommendations:    Spoke with pt at bedside regarding his diabetes and HgbA1C of 8.9%. Pt recently diagnosed and PCP started on metaglip. Does not have meter to check blood sugars. Recommended ReliOn blood glucose meter kit at [employer]. Discussed glucose and A1C goals. Discussed importance of checking CBGs and maintaining good CBG control to prevent long-term and short-term complications. Explained how hyperglycemia leads to damage within blood vessels which lead to the common complications seen with uncontrolled diabetes. Stressed to the patient the importance of improving glycemic control to prevent further complications from uncontrolled diabetes. Discussed impact of nutrition, exercise, stress, sickness, and medications on diabetes control.  Discussed carbohydrates, along with portion sizes. Pt very appreciative of information. States he will purchase meter tomorrow when he is discharged. Recommend checking before each meal and bedtime for the next couple of weeks, then f/u with PCP.  Continue to follow.  Thank you. Shona Brandy, RD, LDN, CDCES Inpatient Diabetes Coordinator [phone]

## 2023-12-28 NOTE — Progress Notes (Signed)
 Mobility Specialist Progress Note:   12/28/23 1624  Mobility  Activity Ambulated with assistance  Level of Assistance Modified independent, requires aide device or extra time  Assistive Device  (IV Pole)  Distance Ambulated (ft) 270 ft  Activity Response Tolerated well  Mobility Referral Yes  Mobility visit 1 Mobility  Mobility Specialist Start Time (ACUTE ONLY) 1522  Mobility Specialist Stop Time (ACUTE ONLY) 1535  Mobility Specialist Time Calculation (min) (ACUTE ONLY) 13 min   Pt was received in bathroom and agreed to mobility. No complaints/issues during session. Returned to bed with all needs met. Call bell in reach.  [employer] - Mobility Specialist

## 2023-12-28 NOTE — Progress Notes (Signed)
°  Subjective No acute events. Feeling quite well. Ambulating. No flatus/BM yet. Hungry. Tolerating liquids without n/v. No flatus/BM yet  Objective: Vital signs in last 24 hours: Temp:  [97 F (36.1 C)-100.2 F (37.9 C)] 98.1 F (36.7 C) (12/11 0537) Pulse Rate:  [57-93] 78 (12/11 0537) Resp:  [11-18] 18 (12/11 0537) BP: (134-193)/(89-139) 134/96 (12/11 0537) SpO2:  [74 %-100 %] 98 % (12/11 0537) Weight:  [89.8 kg-94.1 kg] 94.1 kg (12/11 0500) Last BM Date : 12/26/23  Intake/Output from previous day: 12/10 0701 - 12/11 0700 In: 1661 [P.O.:360; I.V.:1201; IV Piggyback:100] Out: 4400 [Urine:4350; Blood:50] Intake/Output this shift: Total I/O In: -  Out: 600 [Urine:600]  Gen: NAD, comfortable CV: RRR Pulm: Normal work of breathing Abd: Soft, NT/ND; incisions c/d without erythema/drainage Ext: SCDs in place  Lab Results: CBC  Recent Labs    12/28/23 0513  WBC 5.9  HGB 14.8  HCT 40.2  PLT 63*   BMET Recent Labs    12/28/23 0513  NA 138  K 4.2  CL 98  CO2 21*  GLUCOSE 156*  BUN 9  CREATININE 1.05  CALCIUM 8.8*   PT/INR No results for input(s): LABPROT, INR in the last 72 hours. ABG No results for input(s): PHART, HCO3 in the last 72 hours.  Invalid input(s): PCO2, PO2  Studies/Results:  Anti-infectives: Anti-infectives (From admission, onward)    Start     Dose/Rate Route Frequency Ordered Stop   12/27/23 1400  neomycin (MYCIFRADIN) tablet 1,000 mg  Status:  Discontinued       Placed in And Linked Group   1,000 mg Oral 3 times per day 12/27/23 1028 12/27/23 1034   12/27/23 1400  metroNIDAZOLE (FLAGYL) tablet 1,000 mg  Status:  Discontinued       Placed in And Linked Group   1,000 mg Oral 3 times per day 12/27/23 1028 12/27/23 1034   12/27/23 1030  cefoTEtan  (CEFOTAN ) 2 g in sodium chloride  0.9 % 100 mL IVPB        2 g 200 mL/hr over 30 Minutes Intravenous On call to O.R. 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 All questions answered. He expressed understanding and agreement with the plan   LOS: 1 day    Lonni Pizza, MD [facility]

## 2023-12-28 NOTE — Progress Notes (Signed)
°   12/28/23 1145  TOC Brief Assessment  Insurance and Status Reviewed  Patient has primary care physician Yes  Home environment has been reviewed resides in a private residence  Prior level of function: Independent  Prior/Current Home Services No current home services  Social Drivers of Health Review SDOH reviewed no interventions necessary  Readmission risk has been reviewed Yes  Transition of care needs no transition of care needs at this time

## 2023-12-29 LAB — CBC
HCT: 38.1 % — ABNORMAL LOW (ref 39.0–52.0)
Hemoglobin: 13.6 g/dL (ref 13.0–17.0)
MCH: 28.6 pg (ref 26.0–34.0)
MCHC: 35.7 g/dL (ref 30.0–36.0)
MCV: 80 fL (ref 80.0–100.0)
Platelets: 291 K/uL (ref 150–400)
RBC: 4.76 MIL/uL (ref 4.22–5.81)
RDW: 13.6 % (ref 11.5–15.5)
WBC: 8.2 K/uL (ref 4.0–10.5)
nRBC: 0 % (ref 0.0–0.2)

## 2023-12-29 LAB — BASIC METABOLIC PANEL WITH GFR
Anion gap: 12 (ref 5–15)
BUN: 11 mg/dL (ref 8–23)
CO2: 25 mmol/L (ref 22–32)
Calcium: 8.6 mg/dL — ABNORMAL LOW (ref 8.9–10.3)
Chloride: 102 mmol/L (ref 98–111)
Creatinine, Ser: 0.82 mg/dL (ref 0.61–1.24)
GFR, Estimated: >60 mL/min (ref >60)
Glucose, Bld: 110 mg/dL — ABNORMAL HIGH (ref 70–99)
Potassium: 3.6 mmol/L (ref 3.5–5.1)
Sodium: 139 mmol/L (ref 135–145)

## 2023-12-29 LAB — GLUCOSE, CAPILLARY: Glucose-Capillary: 134 mg/dL — ABNORMAL HIGH (ref 70–99)

## 2023-12-29 NOTE — Discharge Summary (Signed)
 Patient ID: Terry Mckinney MRN: [id number] DOB/AGE: 1960/06/28 63 y.o.  Admit date: 12/27/2023 Discharge date: 12/29/2023  Discharge Diagnoses Patient Active Problem List   Diagnosis Date Noted   S/P laparoscopic-assisted sigmoidectomy 12/27/2023   Adenocarcinoma of sigmoid colon (HCC) 11/15/2023   Asthma-COPD overlap syndrome (HCC) 12/07/2021    Consultants none  Procedures Robotic assisted low anterior resection with double stapled coloproctostomy Robotic lysis of adhesions x 60 minutes Intraoperative assessment of perfusion using ICG fluorescence imaging Flexible sigmoidoscopy Bilateral transversus abdominus plane (TAP) blocks  FINDINGS No evidence of metastatic disease on visceral parietal peritoneum or liver. Visible mass involving proximal sigmoid colon. Low anterior resction to proximal rectum was necessary to ensure perfusion of rectum in setting of high IMA ligation. A well perfused, tension free, hemostatic, air tight 15 mm EEA colorectal anastomosis fashioned 15 cm from the anal verge by flexible sigmoidoscopy.   Hospital Course: He was admitted postoperatively and recovered well.  His diet was advanced.  He tolerated this well.  He began having return of bowel function.  Mobilizing well on his own.  Voiding.  Pain well-controlled.  On 12/29/2023, he is motivated to go home and is certainly stable for this from a medical perspective.  Pathology is pending.  Postoperative expectations have been reviewed and all questions answered.  Follow-up has been arranged.    Allergies as of 12/29/2023       Reactions   Naproxen Anaphylaxis, Shortness Of Breath, Swelling   Tongue and throat swelling         Medication List     TAKE these medications    acetaminophen  650 MG CR tablet Commonly known as: TYLENOL  Take 1,300 mg by mouth every 8 (eight) hours as needed for pain.   albuterol  108 (90 Base) MCG/ACT inhaler Commonly known as: VENTOLIN  HFA Inhale 2 puffs into the  lungs every 6 (six) hours as needed for wheezing or shortness of breath.   ALPRAZolam 0.25 MG tablet Commonly known as: XANAX Take 0.25 mg by mouth 3 (three) times daily as needed for anxiety.   azithromycin  250 MG tablet Commonly known as: ZITHROMAX  Take 1 tablet (250 mg total) by mouth daily. Take first 2 tablets together, then 1 every day until finished.   budesonide -formoterol  160-4.5 MCG/ACT inhaler Commonly known as: Symbicort  Inhale 2 puffs into the lungs 2 (two) times daily.   glipiZIDE -metformin 2.5-500 MG tablet Commonly known as: METAGLIP Take 1 tablet by mouth daily.   ibuprofen  600 MG tablet Commonly known as: ADVIL  Take 1 tablet (600 mg total) by mouth every 6 (six) hours as needed.   LORazepam  1 MG tablet Commonly known as: Ativan  Take 1 tablet (1 mg total) by mouth 3 (three) times daily as needed for anxiety.   LORazepam  1 MG tablet Commonly known as: Ativan  Take 1 tablet (1 mg total) by mouth 2 (two) times daily as needed for up to 5 doses for anxiety.   sildenafil 50 MG tablet Commonly known as: VIAGRA Take 50 mg by mouth daily.   traMADol  50 MG tablet Commonly known as: Ultram  Take 1 tablet (50 mg total) by mouth every 6 (six) hours as needed for up to 5 days (postop pain not controlled with tylenol  and ibuprofen  first).          Follow-up Information     Teresa Lonni HERO, MD Follow up on 01/24/2024.   Specialties: General Surgery, Colon and Rectal Surgery Why: Please arrive by 8:30 am Contact information: [address]  [place] [address] [phone]                 Lonni HERO. Teresa, M.D. [facility]

## 2023-12-29 NOTE — Progress Notes (Signed)
°  Subjective No acute events. Feeling great. Ambulating. Having flatus and bms now.  Tolerating solid  diet without n/v.  Objective: Vital signs in last 24 hours: Temp:  [98 F (36.7 C)-98.8 F (37.1 C)] 98 F (36.7 C) (12/12 0549) Pulse Rate:  [62-89] 70 (12/12 0549) Resp:  [18] 18 (12/12 0549) BP: (119-149)/(77-94) 149/94 (12/12 0549) SpO2:  [96 %-98 %] 98 % (12/12 0549) Last BM Date : 12/29/23  Intake/Output from previous day: 12/11 0701 - 12/12 0700 In: 2723.8 [P.O.:1680; I.V.:1043.8] Out: 1200 [Urine:1200] Intake/Output this shift: No intake/output data recorded.  Gen: NAD, comfortable CV: RRR Pulm: Normal work of breathing Abd: Soft, NT/ND; incisions c/d without erythema/drainage Ext: SCDs in place  Lab Results: CBC  Recent Labs    12/28/23 0513  WBC 5.9  HGB 14.8  HCT 40.2  PLT 63*   BMET Recent Labs    12/28/23 0513 12/29/23 0434  NA 138 139  K 4.2 3.6  CL 98 102  CO2 21* 25  GLUCOSE 156* 110*  BUN 9 11  CREATININE 1.05 0.82  CALCIUM 8.8* 8.6*   PT/INR No results for input(s): LABPROT, INR in the last 72 hours. ABG No results for input(s): PHART, HCO3 in the last 72 hours.  Invalid input(s): PCO2, PO2  Studies/Results:  Anti-infectives: Anti-infectives (From admission, onward)    Start     Dose/Rate Route Frequency Ordered Stop   12/27/23 1400  neomycin (MYCIFRADIN) tablet 1,000 mg  Status:  Discontinued       Placed in And Linked Group   1,000 mg Oral 3 times per day 12/27/23 1028 12/27/23 1034   12/27/23 1400  metroNIDAZOLE (FLAGYL) tablet 1,000 mg  Status:  Discontinued       Placed in And Linked Group   1,000 mg Oral 3 times per day 12/27/23 1028 12/27/23 1034   12/27/23 1030  cefoTEtan  (CEFOTAN ) 2 g in sodium chloride  0.9 % 100 mL IVPB        2 g 200 mL/hr over 30 Minutes Intravenous On call to O.R. 12/27/23 1028 12/27/23 1226        Assessment/Plan: Patient Active Problem List   Diagnosis Date Noted   S/P  laparoscopic-assisted sigmoidectomy 12/27/2023   Adenocarcinoma of sigmoid colon (HCC) 11/15/2023   Asthma-COPD overlap syndrome (HCC) 12/07/2021   s/p Procedures: RESECTION, RECTUM, LOW ANTERIOR, ROBOT-ASSISTED; LYSIS OF ADHESIONS SIGMOIDOSCOPY, FLEXIBLE 12/27/2023  - Doing quite well - Tolerating diet, having reliable bowel function - PPX: SQH, SCDs - He is motivated to go home today if at all possible.  We spent time reviewing postoperative expectations.  All of his questions were answered, he expressed understanding, and agreed with the plan.  Follow-up has been arranged.  Pathology is pending.   LOS: 2 days    Lonni Pizza, MD [facility]

## 2023-12-29 NOTE — Progress Notes (Signed)
 Pt had swallowed the Tylenol  that was given. However, he felt as though it was still in his throat. He as able to get it down with coughing and swallowing water. He shared with nurse that he wanted to hold off on any additional pills, for now. He is warm, dry, no visible distress. He is currently in bed resting.

## 2024-01-02 LAB — SURGICAL PATHOLOGY

## 2024-01-03 ENCOUNTER — Ambulatory Visit: Payer: Self-pay | Admitting: Surgery

## 2024-01-11 ENCOUNTER — Other Ambulatory Visit: Payer: Self-pay | Admitting: Nurse Practitioner

## 2024-01-11 DIAGNOSIS — C187 Malignant neoplasm of sigmoid colon: Secondary | ICD-10-CM
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
## 2024-01-12 ENCOUNTER — Inpatient Hospital Stay: Admitting: Nurse Practitioner

## 2024-01-12 ENCOUNTER — Inpatient Hospital Stay: Admitting: Dietician

## 2024-01-12 ENCOUNTER — Telehealth: Payer: Self-pay | Admitting: Nurse Practitioner

## 2024-01-12 ENCOUNTER — Inpatient Hospital Stay: Attending: Nurse Practitioner

## 2024-01-12 DIAGNOSIS — C187 Malignant neoplasm of sigmoid colon: Secondary | ICD-10-CM | POA: Insufficient documentation

## 2024-01-15 ENCOUNTER — Encounter (HOSPITAL_COMMUNITY): Payer: Self-pay
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
## 2024-01-17 ENCOUNTER — Inpatient Hospital Stay: Admitting: Dietician

## 2024-01-17 ENCOUNTER — Inpatient Hospital Stay

## 2024-01-17 ENCOUNTER — Inpatient Hospital Stay: Admitting: Nurse Practitioner

## 2024-01-17 VITALS — BP 125/84 | HR 59 | Temp 97.9°F | Resp 17 | Ht 69.0 in | Wt 197.9 lb

## 2024-01-17 DIAGNOSIS — C187 Malignant neoplasm of sigmoid colon: Secondary | ICD-10-CM | POA: Diagnosis present

## 2024-01-17 LAB — CBC WITH DIFFERENTIAL (CANCER CENTER ONLY)
Abs Immature Granulocytes: 0 K/uL (ref 0.00–0.07)
Basophils Absolute: 0.1 K/uL (ref 0.0–0.1)
Basophils Relative: 1 %
Eosinophils Absolute: 0.2 K/uL (ref 0.0–0.5)
Eosinophils Relative: 3 %
HCT: 38 % — ABNORMAL LOW (ref 39.0–52.0)
Hemoglobin: 13.9 g/dL (ref 13.0–17.0)
Immature Granulocytes: 0 %
Lymphocytes Relative: 33 %
Lymphs Abs: 2.1 K/uL (ref 0.7–4.0)
MCH: 28.3 pg (ref 26.0–34.0)
MCHC: 36.6 g/dL — ABNORMAL HIGH (ref 30.0–36.0)
MCV: 77.4 fL — ABNORMAL LOW (ref 80.0–100.0)
Monocytes Absolute: 0.5 K/uL (ref 0.1–1.0)
Monocytes Relative: 7 %
Neutro Abs: 3.6 K/uL (ref 1.7–7.7)
Neutrophils Relative %: 56 %
Platelet Count: 381 K/uL (ref 150–400)
RBC: 4.91 MIL/uL (ref 4.22–5.81)
RDW: 13 % (ref 11.5–15.5)
WBC Count: 6.3 K/uL (ref 4.0–10.5)
nRBC: 0 % (ref 0.0–0.2)

## 2024-01-17 LAB — CMP (CANCER CENTER ONLY)
ALT: 23 U/L (ref 0–44)
AST: 20 U/L (ref 15–41)
Albumin: 4.3 g/dL (ref 3.5–5.0)
Alkaline Phosphatase: 104 U/L (ref 38–126)
Anion gap: 11 (ref 5–15)
BUN: 10 mg/dL (ref 8–23)
CO2: 25 mmol/L (ref 22–32)
Calcium: 9.3 mg/dL (ref 8.9–10.3)
Chloride: 100 mmol/L (ref 98–111)
Creatinine: 0.92 mg/dL (ref 0.61–1.24)
GFR, Estimated: >60 mL/min
Glucose, Bld: 177 mg/dL — ABNORMAL HIGH (ref 70–99)
Potassium: 3.8 mmol/L (ref 3.5–5.1)
Sodium: 136 mmol/L (ref 135–145)
Total Bilirubin: 0.7 mg/dL (ref 0.0–1.2)
Total Protein: 7.6 g/dL (ref 6.5–8.1)

## 2024-01-17 NOTE — Progress Notes (Signed)
 Nutrition Assessment  Reason for Assessment: Referral   ASSESSMENT: 63 year old male with history of COPD, asthma, anxiety diagnosed with adenocarcinoma of sigmoid colon. S/p resection under the care of Dr. Teresa (12/10). Noted negative margins with no evidence of metastatic disease. Patient is under the care of Dr. Lanny  Met with patient in office. He is feeling great. Patient has made lifestyle changes since returning home from surgery. Says his fridge is filled with lots of fruits and vegetables. He is eating 3 meals and occasional snacks. Breakfast is an omelette with spinach. Has sandwich with fruit at lunch. Eats a balanced dinner (baked chicken, collards, rice). He has stopped eating processed foods and drinking dark sodas/juices. Drinking 120 ounces of water. Patient avoiding white potatoes and rice with recent DM diagnoses. He has also heard these foods turn into sugar that will feed cancer.    Nutrition Focused Physical Exam: no fat/muscle depletions appreciated    Medications: xanax , metaglip , ativan    Labs: glucose 177 12/1 HgbA1c - 8.9   Anthropometrics:   Height: 5'9 Weight: 197 lb 14.4 oz  UBW: 200 lb (per pt)  BMI: 29.22   NUTRITION DIAGNOSIS: Food and nutrition related knowledge deficit related to cancer and newly diagnosed DM as evidenced by no prior need for associated nutrition education    INTERVENTION:  Continue strategies for increasing plant-based proteins - discussed AICR recommendations for cancer prevention/reduce risk of recurrence (handout provided)  Educated on balanced meals and snacks for good glucose control - encouraged pt to inquire about CGM at next PCP visit Encourage activity  Contact information   MONITORING, EVALUATION, GOAL: Patient will tolerate adequate calories and protein to support wt maintenance    Next Visit: No follow-up scheduled at this time. Patient has contact information and encouraged to call with nutrition  questions/concerns

## 2024-01-19 LAB — CEA (ACCESS): CEA (CHCC): 4.96 ng/mL (ref 0.00–5.00)

## 2024-01-24 ENCOUNTER — Other Ambulatory Visit: Payer: Self-pay | Admitting: *Deleted

## 2024-01-24 ENCOUNTER — Encounter: Payer: Self-pay | Admitting: Nurse Practitioner

## 2024-01-24 NOTE — Progress Notes (Signed)
 The proposed treatment discussed in conference is for discussion purpose only and is not a binding recommendation.  The patients have not been physically examined, or presented with their treatment options.  Therefore, final treatment plans cannot be decided.

## 2024-01-30 ENCOUNTER — Other Ambulatory Visit: Payer: Self-pay

## 2024-01-30 DIAGNOSIS — C187 Malignant neoplasm of sigmoid colon: Secondary | ICD-10-CM

## 2024-01-30 NOTE — Progress Notes (Signed)
 [employer] orders entered per Dr. Demetra request

## 2024-02-02 ENCOUNTER — Inpatient Hospital Stay (HOSPITAL_BASED_OUTPATIENT_CLINIC_OR_DEPARTMENT_OTHER)

## 2024-02-02 ENCOUNTER — Inpatient Hospital Stay: Attending: Nurse Practitioner | Admitting: Hematology

## 2024-02-02 VITALS — BP 138/70 | HR 66 | Temp 98.2°F | Resp 15 | Ht 69.0 in | Wt 200.0 lb

## 2024-02-02 DIAGNOSIS — C187 Malignant neoplasm of sigmoid colon: Secondary | ICD-10-CM

## 2024-02-02 LAB — GENETIC SCREENING ORDER

## 2024-02-02 NOTE — Progress Notes (Signed)
 " [facility]   [phone] [phone]   Clinic Follow up Note   Patient Care Team: Durel Riggs, FNP as PCP - General (Nurse Practitioner) Ardis Evalene CROME, RN as Oncology Nurse Navigator  Date of Service:  02/02/2024  CHIEF COMPLAINT: f/u of colon cancer   CURRENT THERAPY:  Cancer surveillance  Oncology History   Adenocarcinoma of sigmoid colon (HCC) pT3N0M0, stage II, MSS, G2 - Diagnosed in October 2025, discovered by screening Cologuard initially. -s/p left hemicolectomy, which showed 5.2 cm moderately differentiated adenocarcinoma in the rectosigmoid colon, all lymph nodes were negative, margins were negative.  No lymphovascular invasion. -Given the lack of high risk features, I do not recommend adjuvant chemotherapy if ctDNA is negative.  I recommend cancer surveillance.  Assessment & Plan Stage II sigmoid colon adenocarcinoma Status post laparoscopic resection for non-high risk stage II sigmoid colon adenocarcinoma (moderately differentiated, T3, no lymphovascular or perineural invasion, 24 negative lymph nodes). Adjuvant chemotherapy is not indicated due to absence of high-risk features. He is recovering well post-operatively and is motivated to maintain a healthy lifestyle. - Reviewed surgical pathology and staging details. - Discussed that adjuvant chemotherapy is not routinely recommended for non-high risk stage II colon cancer, unless ctDNA test positive  - Provided education regarding healthy lifestyle modifications: increased intake of vegetables and whole foods, decreased processed and preserved foods, regular exercise. - Recommended vitamin D supplementation (2000 units daily). - Advised to avoid heavy lifting and maintain physical activity.  Surveillance for minimal residual disease in colon cancer Surveillance is appropriate following resection of non-high risk stage II colon cancer. Estimated recurrence risk is 15-20%.  Molecular testing for minimal residual disease will guide further management; chemotherapy will be considered only if minimal residual disease is detected. - Ordered Signatera molecular testing for minimal residual disease (blood draw completed today). - Discussed that chemotherapy will be recommended if Signatera is positive. - Discussed that if Signatera is negative, surveillance will continue with clinical visits every 3-4 months and imaging in 6-12 months. - Scheduled follow-up visit in 3 months. - Discussed surveillance protocol for 5 years, with most intensive follow-up in the first 3 years.  Plan -surgical path reviewed, he has recovered well from surgery. - CT DNA testing Signatera was obtained today.  Negative, I do not recommend adjuvant chemotherapy. -lab and follow-up in 3 months, or sooner if needed.   SUMMARY OF ONCOLOGIC HISTORY: Oncology History  Adenocarcinoma of sigmoid colon (HCC)  11/15/2023 Initial Diagnosis   Adenocarcinoma of sigmoid colon (HCC)   12/27/2023 Cancer Staging   Staging form: Colon and Rectum, AJCC 8th Edition - Pathologic stage from 12/27/2023: Stage IIA (pT3, pN0, cM0) - Signed by Lanny Callander, MD on 02/02/2024 Total positive nodes: 0 Histologic grading system: 4 grade system Histologic grade (G): G2 Residual tumor (R): R0      Discussed the use of AI scribe software for clinical note transcription with the patient, who gave verbal consent to proceed.  History of Present Illness Terry Mckinney is a 64 year old male with stage II sigmoid colon adenocarcinoma, status post laparoscopic resection, who presents for post-operative oncology follow-up and surveillance for minimal residual disease.  He is recovering well from laparoscopic resection with the largest incision in the lower abdomen. Bowel movements are back to baseline. He has no new or concerning postoperative symptoms.  Pathology showed a 5.2 cm moderately differentiated invasive sigmoid  colon adenocarcinoma, pT3, with 24 of 24 lymph nodes negative and no lymphovascular or perineural invasion.  He had blood drawn earlier today for Signatera circulating tumor DNA testing.  He has increased intake of vegetables and whole foods, avoids fast and processed foods, takes vitamin D 2000 units daily, and remains physically active with regular walking at work.     All other systems were reviewed with the patient and are negative.  MEDICAL HISTORY:  Past Medical History:  Diagnosis Date   Anxiety    Arthritis    Asthma    Cancer (HCC)    COPD (chronic obstructive pulmonary disease) (HCC)    Dyspnea    Phobia     SURGICAL HISTORY: Past Surgical History:  Procedure Laterality Date   ABDOMINAL SURGERY     FLEXIBLE SIGMOIDOSCOPY N/A 12/27/2023   Procedure: KINGSTON SIDE;  Surgeon: Teresa Lonni HERO, MD;  Location: [facility];  Service: General;  Laterality: N/A;   HERNIA REPAIR Bilateral    I & D EXTREMITY Right 11/07/2013   Procedure: IRRIGATION AND DEBRIDEMENT EXTREMITY;  Surgeon: Franky Curia, MD;  Location: [facility];  Service: Orthopedics;  Laterality: Right;   KNEE SURGERY     XI ROBOTIC ASSISTED LOWER ANTERIOR RESECTION N/A 12/27/2023   Procedure: RESECTION, RECTUM, LOW ANTERIOR, ROBOT-ASSISTED; LYSIS OF ADHESIONS;  Surgeon: Teresa Lonni HERO, MD;  Location: [facility];  Service: General;  Laterality: N/A;  ROBOTIC ASSISTED LOW ANTERIOR RESECTION VS SIGMOIDECTOMY    I have reviewed the social history and family history with the patient and they are unchanged from previous note.  ALLERGIES:  is allergic to naproxen.  MEDICATIONS:  Current Outpatient Medications  Medication Sig Dispense Refill   acetaminophen  (TYLENOL ) 650 MG CR tablet Take 1,300 mg by mouth every 8 (eight) hours as needed for pain.     albuterol  (VENTOLIN  HFA) 108 (90 Base) MCG/ACT inhaler Inhale 2 puffs into the lungs every 6 (six) hours as needed for wheezing or shortness of breath.      ALPRAZolam  (XANAX ) 0.25 MG tablet Take 0.25 mg by mouth 3 (three) times daily as needed for anxiety.     azithromycin  (ZITHROMAX ) 250 MG tablet Take 1 tablet (250 mg total) by mouth daily. Take first 2 tablets together, then 1 every day until finished. (Patient not taking: No sig reported) 6 tablet 0   budesonide -formoterol  (SYMBICORT ) 160-4.5 MCG/ACT inhaler Inhale 2 puffs into the lungs 2 (two) times daily. (Patient not taking: No sig reported) 1 each 6   glipiZIDE -metformin  (METAGLIP ) 2.5-500 MG tablet Take 1 tablet by mouth daily.     ibuprofen  (ADVIL ) 600 MG tablet Take 1 tablet (600 mg total) by mouth every 6 (six) hours as needed. (Patient not taking: No sig reported) 30 tablet 0   LORazepam  (ATIVAN ) 1 MG tablet Take 1 tablet (1 mg total) by mouth 3 (three) times daily as needed for anxiety. (Patient not taking: Reported on 11/02/2021) 15 tablet 0   LORazepam  (ATIVAN ) 1 MG tablet Take 1 tablet (1 mg total) by mouth 2 (two) times daily as needed for up to 5 doses for anxiety. (Patient not taking: No sig reported) 5 tablet 0   sildenafil (VIAGRA) 50 MG tablet Take 50 mg by mouth daily.     No current facility-administered medications for this visit.    PHYSICAL EXAMINATION: ECOG PERFORMANCE STATUS: 0 - Asymptomatic  Vitals:   02/02/24 0958  BP: 138/70  Pulse: 66  Resp: 15  Temp: 98.2 F (36.8 C)  SpO2: 95%   Wt Readings from Last 3 Encounters:  02/02/24 200 lb (90.7 kg)  01/17/24  197 lb 14.4 oz (89.8 kg)  12/28/23 207 lb 6.4 oz (94.1 kg)     GENERAL:alert, no distress and comfortable SKIN: skin color, texture, turgor are normal, no rashes or significant lesions EYES: normal, Conjunctiva are pink and non-injected, sclera clear NECK: supple, thyroid normal size, non-tender, without nodularity LYMPH:  no palpable lymphadenopathy in the cervical, axillary  LUNGS: clear to auscultation and percussion with normal breathing effort HEART: regular rate & rhythm and no murmurs and no  lower extremity edema ABDOMEN:abdomen soft, non-tender and normal bowel sounds Musculoskeletal:no cyanosis of digits and no clubbing  NEURO: alert & oriented x 3 with fluent speech, no focal motor/sensory deficits  Physical Exam    LABORATORY DATA:  I have reviewed the data as listed    Latest Ref Rng & Units 01/17/2024    1:44 PM 12/29/2023   10:25 AM 12/28/2023    5:13 AM  CBC  WBC 4.0 - 10.5 K/uL 6.3  8.2  5.9   Hemoglobin 13.0 - 17.0 g/dL 86.0  86.3  85.1   Hematocrit 39.0 - 52.0 % 38.0  38.1  40.2   Platelets 150 - 400 K/uL 381  291  63         Latest Ref Rng & Units 01/17/2024    1:44 PM 12/29/2023    4:34 AM 12/28/2023    5:13 AM  CMP  Glucose 70 - 99 mg/dL 822  889  843   BUN 8 - 23 mg/dL 10  11  9    Creatinine 0.61 - 1.24 mg/dL 9.07  9.17  8.94   Sodium 135 - 145 mmol/L 136  139  138   Potassium 3.5 - 5.1 mmol/L 3.8  3.6  4.2   Chloride 98 - 111 mmol/L 100  102  98   CO2 22 - 32 mmol/L 25  25  21    Calcium 8.9 - 10.3 mg/dL 9.3  8.6  8.8   Total Protein 6.5 - 8.1 g/dL 7.6     Total Bilirubin 0.0 - 1.2 mg/dL 0.7     Alkaline Phos 38 - 126 U/L 104     AST 15 - 41 U/L 20     ALT 0 - 44 U/L 23         RADIOGRAPHIC STUDIES: I have personally reviewed the radiological images as listed and agreed with the findings in the report. No results found.    No orders of the defined types were placed in this encounter.  All questions were answered. The patient knows to call the clinic with any problems, questions or concerns. No barriers to learning was detected. The total time spent in the appointment was 25 minutes, including review of chart and various tests results, discussions about plan of care and coordination of care plan     Onita Mattock, MD 02/02/2024     "

## 2024-02-02 NOTE — Assessment & Plan Note (Signed)
 pT3N0M0, stage II, MSS, G2 - Diagnosed in October 2025, discovered by screening Cologuard initially. -s/p left hemicolectomy, which showed 5.2 cm moderately differentiated adenocarcinoma in the rectosigmoid colon, all lymph nodes were negative, margins were negative.  No lymphovascular invasion. -Given the lack of high risk features, I do not recommend adjuvant chemotherapy if ctDNA is negative.  I recommend cancer surveillance.

## 2024-05-06 ENCOUNTER — Inpatient Hospital Stay: Admitting: Hematology

## 2024-05-06 ENCOUNTER — Inpatient Hospital Stay
